# Patient Record
Sex: Female | Born: 1955
Health system: Southern US, Community
[De-identification: ages and names within clinical notes are randomized; demographics above are authoritative.]

## PROBLEM LIST (undated history)

## (undated) ENCOUNTER — Ambulatory Visit (HOSPITAL_COMMUNITY): Admission: EM | Discharge status: Absent without leave | Payer: Self-pay

## (undated) DIAGNOSIS — C8590 Non-Hodgkin lymphoma, unspecified, unspecified site: Secondary | ICD-10-CM

---

## 2007-02-11 ENCOUNTER — Ambulatory Visit: Payer: Self-pay | Admitting: Family Medicine

## 2008-04-15 ENCOUNTER — Emergency Department (HOSPITAL_COMMUNITY): Admission: EM | Admit: 2008-04-15 | Discharge: 2008-04-15 | Payer: Self-pay | Admitting: Emergency Medicine

## 2008-04-22 ENCOUNTER — Emergency Department (HOSPITAL_COMMUNITY): Admission: EM | Admit: 2008-04-22 | Discharge: 2008-04-22 | Payer: Self-pay | Admitting: Family Medicine

## 2011-06-22 LAB — BASIC METABOLIC PANEL
CO2: 22
Calcium: 9.3
Creatinine, Ser: 0.92
Glucose, Bld: 82
Sodium: 138

## 2011-06-22 LAB — DIFFERENTIAL
Basophils Absolute: 0.1
Basophils Relative: 0
Eosinophils Absolute: 0
Eosinophils Relative: 0
Monocytes Absolute: 0.3
Neutro Abs: 12.6 — ABNORMAL HIGH

## 2011-06-22 LAB — CBC
Hemoglobin: 14.4
MCHC: 34.1
RDW: 12.8

## 2012-01-22 DIAGNOSIS — L80 Vitiligo: Secondary | ICD-10-CM | POA: Insufficient documentation

## 2015-04-06 DIAGNOSIS — C8218 Follicular lymphoma grade II, lymph nodes of multiple sites: Secondary | ICD-10-CM | POA: Insufficient documentation

## 2016-03-14 DIAGNOSIS — I10 Essential (primary) hypertension: Secondary | ICD-10-CM | POA: Insufficient documentation

## 2017-12-03 DIAGNOSIS — R5383 Other fatigue: Secondary | ICD-10-CM | POA: Insufficient documentation

## 2017-12-19 DIAGNOSIS — I82419 Acute embolism and thrombosis of unspecified femoral vein: Secondary | ICD-10-CM | POA: Insufficient documentation

## 2018-04-01 ENCOUNTER — Encounter (HOSPITAL_COMMUNITY): Payer: Self-pay | Admitting: Emergency Medicine

## 2018-04-01 ENCOUNTER — Ambulatory Visit (HOSPITAL_COMMUNITY)
Admission: EM | Admit: 2018-04-01 | Discharge: 2018-04-01 | Disposition: A | Discharge status: Home / Self Care | Payer: No Typology Code available for payment source | Attending: Family Medicine | Admitting: Family Medicine

## 2018-04-01 DIAGNOSIS — J0140 Acute pansinusitis, unspecified: Secondary | ICD-10-CM

## 2018-04-01 HISTORY — DX: Non-Hodgkin lymphoma, unspecified, unspecified site: C85.90

## 2018-04-01 MED ORDER — AMOXICILLIN-POT CLAVULANATE 875-125 MG PO TABS: 1.0000 | ORAL_TABLET | Freq: Two times a day (BID) | ORAL | 0 refills | Status: DC

## 2018-04-01 MED ORDER — IPRATROPIUM BROMIDE 0.06 % NA SOLN: 2.0000 | Freq: Four times a day (QID) | NASAL | 0 refills | Status: DC

## 2018-04-01 NOTE — Discharge Instructions (Signed)
Start augmentin as directed. Continue nasacort. Start atrovent nasal spray, allergy medicine for nasal congestion/drainage. You can use over the counter nasal saline rinse such as neti pot for nasal congestion. Keep hydrated, your urine should be clear to pale yellow in color. Tylenol/motrin for fever and pain. Monitor for any worsening of symptoms, chest pain, shortness of breath, wheezing, swelling of the throat, follow up for reevaluation.   For sore throat/cough try using a honey-based tea. Use 3 teaspoons of honey with juice squeezed from half lemon. Place shaved pieces of ginger into 1/2-1 cup of water and warm over stove top. Then mix the ingredients and repeat every 4 hours as needed.

## 2018-04-01 NOTE — ED Triage Notes (Signed)
Pt here for URI sx with congestion

## 2018-04-01 NOTE — ED Provider Notes (Signed)
[place]    CSN: [id number] Arrival date & time: 04/01/18  1146   History   Chief Complaint Chief Complaint  Patient presents with  . URI    appt 1203    HPI Breanna Kirby is a 62 y.o. female.   62 year old female with history of lymphoma comes in for  10 to 11-day history of URI symptoms.  States started out with normal viral illness, rhinorrhea, nasal congestion, cough, congestion. Denies fever, chills, night sweats.  Symptoms are slowly improved, but worsening nasal congestion, sinus pressure.  She has been using Nasacort, nasal saline rinse with good relief until recently. Never smoker.     Past Medical History:  Diagnosis Date  . [name]     There are no active problems to display for this patient.   History reviewed. No pertinent surgical history.  OB History   None      Home Medications    Prior to Admission medications   Medication Sig Start Date End Date Taking? Authorizing Provider  amoxicillin-clavulanate (AUGMENTIN) 875-125 MG tablet Take 1 tablet by mouth every 12 (twelve) hours. 04/01/18   Tasia Catchings, Kynnedi Zweig V, PA-C  ipratropium (ATROVENT) 0.06 % nasal spray Place 2 sprays into both nostrils 4 (four) times daily. 04/01/18   Ok Edwards, PA-C    Family History History reviewed. No pertinent family history.  Social History Social History   Tobacco Use  . Smoking status: Never Smoker  . Smokeless tobacco: Never Used  Substance Use Topics  . Alcohol use: Never    Frequency: Never  . Drug use: Never     Allergies   Patient has no known allergies.   Review of Systems Review of Systems  Reason unable to perform ROS: See HPI as above.     Physical Exam Triage Vital Signs ED Triage Vitals [04/01/18 1213]  Enc Vitals Group     BP (!) 139/95     Pulse Rate 80     Resp 18     Temp 98.3 F (36.8 C)     Temp Source Oral     SpO2 100 %     Weight      Height      Head Circumference      Peak Flow      Pain Score      Pain  Loc      Pain Edu?      Excl. in [place]?    No data found.  Updated Vital Signs BP (!) 139/95 (BP Location: Left Arm)   Pulse 80   Temp 98.3 F (36.8 C) (Oral)   Resp 18   SpO2 100%   Physical Exam  Constitutional: She is oriented to person, place, and time. She appears well-developed and well-nourished. No distress.  HENT:  Head: Normocephalic and atraumatic.  Right Ear: Tympanic membrane, external ear and ear canal normal. Tympanic membrane is not erythematous and not bulging.  Left Ear: Tympanic membrane, external ear and ear canal normal. Tympanic membrane is not erythematous and not bulging.  Nose: Rhinorrhea present. Right sinus exhibits maxillary sinus tenderness and frontal sinus tenderness. Left sinus exhibits maxillary sinus tenderness and frontal sinus tenderness.  Mouth/Throat: Uvula is midline, oropharynx is clear and moist and mucous membranes are normal.  Eyes: Pupils are equal, round, and reactive to light. Conjunctivae are normal.  Neck: Normal range of motion. Neck supple.  Cardiovascular: Normal rate, regular rhythm and normal heart sounds. Exam reveals no gallop and  no friction rub.  No murmur heard. Pulmonary/Chest: Effort normal and breath sounds normal. She has no decreased breath sounds. She has no wheezes. She has no rhonchi. She has no rales.  Lymphadenopathy:    She has no cervical adenopathy.  Neurological: She is alert and oriented to person, place, and time.  Skin: Skin is warm and dry.  Psychiatric: She has a normal mood and affect. Her behavior is normal. Judgment normal.    UC Treatments / Results  Labs (all labs ordered are listed, but only abnormal results are displayed) Labs Reviewed - No data to display  EKG None  Radiology No results found.  Procedures Procedures (including critical care time)  Medications Ordered in UC Medications - No data to display  Initial Impression / Assessment and Plan / UC Course  I have reviewed the  triage vital signs and the nursing notes.  Pertinent labs & imaging results that were available during my care of the patient were reviewed by me and considered in my medical decision making (see chart for details).    Augmentin for sinusitis. Other symptomatic treatment discussed. Return precautions given.   Final Clinical Impressions(s) / UC Diagnoses   Final diagnoses:  Acute non-recurrent pansinusitis    ED Prescriptions    Medication Sig Dispense Auth. Provider   ipratropium (ATROVENT) 0.06 % nasal spray Place 2 sprays into both nostrils 4 (four) times daily. 15 mL Kari Kerth V, PA-C   amoxicillin-clavulanate (AUGMENTIN) 875-125 MG tablet Take 1 tablet by mouth every 12 (twelve) hours. 14 tablet Tobin Chad, [place] 04/01/18 1247

## 2018-04-02 ENCOUNTER — Telehealth (HOSPITAL_COMMUNITY): Payer: Self-pay | Admitting: Emergency Medicine

## 2018-04-02 ENCOUNTER — Telehealth (HOSPITAL_COMMUNITY): Payer: Self-pay

## 2018-04-02 NOTE — Telephone Encounter (Signed)
Patient requested antibiotic change since she has had diarrhea in the past with augmentin.  Spoke to amy yu, pa. Antibiotic changed to:   Doxycycline 100mg  BID x 10 days, #20, NO refills.  Cathlean Sauer, PA.  Called in script to [facility]  [phone] at patient's request.

## 2018-04-24 ENCOUNTER — Other Ambulatory Visit: Payer: Self-pay

## 2018-04-24 ENCOUNTER — Encounter (HOSPITAL_COMMUNITY): Payer: Self-pay | Admitting: *Deleted

## 2018-04-24 ENCOUNTER — Ambulatory Visit (HOSPITAL_COMMUNITY)
Admission: EM | Admit: 2018-04-24 | Discharge: 2018-04-24 | Disposition: A | Discharge status: Home / Self Care | Payer: No Typology Code available for payment source | Attending: Family Medicine | Admitting: Family Medicine

## 2018-04-24 DIAGNOSIS — R42 Dizziness and giddiness: Secondary | ICD-10-CM

## 2018-04-24 DIAGNOSIS — R0602 Shortness of breath: Secondary | ICD-10-CM | POA: Insufficient documentation

## 2018-04-24 DIAGNOSIS — Z79899 Other long term (current) drug therapy: Secondary | ICD-10-CM | POA: Insufficient documentation

## 2018-04-24 DIAGNOSIS — Z86718 Personal history of other venous thrombosis and embolism: Secondary | ICD-10-CM | POA: Insufficient documentation

## 2018-04-24 DIAGNOSIS — Z8572 Personal history of non-Hodgkin lymphomas: Secondary | ICD-10-CM | POA: Insufficient documentation

## 2018-04-24 DIAGNOSIS — R002 Palpitations: Secondary | ICD-10-CM | POA: Diagnosis not present

## 2018-04-24 LAB — POCT I-STAT, CHEM 8
BUN: 16 mg/dL (ref 6–20)
CALCIUM ION: 1.21 mmol/L (ref 1.15–1.40)
CHLORIDE: 99 mmol/L (ref 98–111)
Creatinine, Ser: 0.9 mg/dL (ref 0.44–1.00)
Glucose, Bld: 94 mg/dL (ref 70–99)
HEMATOCRIT: 40 % (ref 36.0–46.0)
HEMOGLOBIN: 13.6 g/dL (ref 12.0–15.0)
POTASSIUM: 4 mmol/L (ref 3.5–5.1)
SODIUM: 136 mmol/L (ref 135–145)
TCO2: 26 mmol/L (ref 22–32)

## 2018-04-24 LAB — TSH: TSH: 1.2 u[IU]/mL (ref 0.350–4.500)

## 2018-04-24 NOTE — Discharge Instructions (Signed)
Your ekg is normal today.  You are not anemic and your basic electrolytes are normal.  We will notify you if your TSH returns abnormal, you may also see your results on your MyChart.  You may need to have a heart monitor to observe your palpitations, I would recommend following up with cardiology for further evaluation.  Continue to follow up with endocrinology as scheduled.  Limit caffeine as able.  If you develop persistent palpitations, chest pain, dizziness, nausea, sweating, arm or jaw pain or otherwise worsening please return or go to Er.

## 2018-04-24 NOTE — ED Triage Notes (Signed)
C/o history of palpitations of which she has had a cardiac workup, usually happens at night. States she thinks she is having panic attacks. This am started having heart racing after walking 45 mins.

## 2018-04-24 NOTE — ED Provider Notes (Signed)
Breanna Kirby    CSN: [id number] Arrival date & time: 04/24/18  1010     History   Chief Complaint Chief Complaint  Patient presents with  . Palpitations    HPI Breanna Kirby is a 62 y.o. female.   Breanna Kirby presents with complaints of heart palpitations. Started approximately 9am this am. States she went on a 45 minute walk which is normal for her. Felt a Breanna Kirby more short of breath than normal for her, did some stretching once home and then decided to soak her feet to try to calm her breathing. As she was sitting soaking her feet she felt the onset of palpitations. No chest pain . Slight dizziness. No nausea or diaphoresis. No jaw or arm pain. States she drinks only a small cup of coffee daily, she had eaten a full breakfast. She is trying to gain weight has as she had approximately 15lb weight loss over a 3 month period. Denies any increased stress but states she feels that in the past she has had some panic attacks. Has been on antidepressants in the in past around family member's deaths, is not on currently. States she has had palpitation episodes for years and has had complete cardiac evaluation for this in the past, probably over 5 years ago. She is followed with oncology for lymphoma, radiation to neck tumor in April of this year. Incidentally found dvt and was on eliquis, this has been completed, with negative ultrasound for dvt 03/26/2018. Has appointment with endocrinology 8/13 for thyroid evaluation as oncologist concerned about weight loss. No known TSH drawn. She takes low dose amlodipine. Doesn't smoke. No night sweats. No urinary frequency or increased thirst. No known bleeding source. No recent travel. No fevers.     ROS per HPI.      Past Medical History:  Diagnosis Date  . Lymphoma ([place])     There are no active problems to display for this patient.   Past Surgical History:  Procedure Laterality Date  . CESAREAN SECTION      OB History   None       Home Medications    Prior to Admission medications   Medication Sig Start Date End Date Taking? Authorizing Provider  amoxicillin-clavulanate (AUGMENTIN) 875-125 MG tablet Take 1 tablet by mouth every 12 (twelve) hours. 04/01/18   Tasia Catchings, Amy V, PA-C  ipratropium (ATROVENT) 0.06 % nasal spray Place 2 sprays into both nostrils 4 (four) times daily. 04/01/18   Ok Edwards, PA-C    Family History History reviewed. No pertinent family history.  Social History Social History   Tobacco Use  . Smoking status: Never Smoker  . Smokeless tobacco: Never Used  Substance Use Topics  . Alcohol use: Yes    Frequency: Never  . Drug use: Never     Allergies   Patient has no known allergies.   Review of Systems Review of Systems   Physical Exam Triage Vital Signs ED Triage Vitals  Enc Vitals Group     BP 04/24/18 1023 (!) 148/82     Pulse Rate 04/24/18 1023 (!) 103     Resp 04/24/18 1023 18     Temp 04/24/18 1023 98.3 F (36.8 C)     Temp Source 04/24/18 1023 Oral     SpO2 04/24/18 1023 100 %     Weight --      Height --      Head Circumference --      Peak Flow --  Pain Score 04/24/18 1025 0     Pain Loc --      Pain Edu? --      Excl. in [place]? --    No data found.  Updated Vital Signs BP (!) 148/82 (BP Location: Left Arm)   Pulse (!) 103   Temp 98.3 F (36.8 C) (Oral)   Resp 18   SpO2 100%    Physical Exam  Constitutional: She is oriented to person, place, and time. She appears well-developed and well-nourished. No distress.  HENT:  Head: Normocephalic and atraumatic.  Cardiovascular: Normal rate, regular rhythm and normal heart sounds.  Pulmonary/Chest: Effort normal and breath sounds normal.  Neurological: She is alert and oriented to person, place, and time. No cranial nerve deficit or sensory deficit.  Skin: Skin is warm and dry.  Psychiatric: She has a normal mood and affect. Her behavior is normal. Thought content normal.  Tearful on initial exam, patient  endorses feeling scared during episodes.    EKG NSR rate 81 without acute changes.   UC Treatments / Results  Labs (all labs ordered are listed, but only abnormal results are displayed) Labs Reviewed  TSH  POCT I-STAT, CHEM 8    EKG None  Radiology No results found.  Procedures Procedures (including critical care time)  Medications Ordered in UC Medications - No data to display  Initial Impression / Assessment and Plan / UC Course  I have reviewed the triage vital signs and the nursing notes.  Pertinent labs & imaging results that were available during my care of the patient were reviewed by me and considered in my medical decision making (see chart for details).     Hr 103 on arrival. By time EKG completed HR 81. During history and physical HR remained 81-83. Regular. No chest pain . No shortness of breath. Cancer hx, not on chemo. No longer on eliquis. No travel, no leg pain. Chem 8 WNl today. TSH pending, has appointment with endocrinology in less than two weeks. Encouraged close follow up with PCP and/or cardiology. Return precautions provided. Patient verbalized understanding and agreeable to plan.    Case reviewed with supervising physician Dr. Meda Coffee.  Final Clinical Impressions(s) / UC Diagnoses   Final diagnoses:  Palpitations     Discharge Instructions     Your ekg is normal today.  You are not anemic and your basic electrolytes are normal.  We will notify you if your TSH returns abnormal, you may also see your results on your MyChart.  You may need to have a heart monitor to observe your palpitations, I would recommend following up with cardiology for further evaluation.  Continue to follow up with endocrinology as scheduled.  Limit caffeine as able.  If you develop persistent palpitations, chest pain, dizziness, nausea, sweating, arm or jaw pain or otherwise worsening please return or go to Er.     ED Prescriptions    None     Controlled Substance  Prescriptions [place] Controlled Substance Registry consulted? Not Applicable   Zigmund Gottron, NP 04/24/18 1139

## 2018-05-12 ENCOUNTER — Ambulatory Visit: Payer: No Typology Code available for payment source | Admitting: Family Medicine

## 2018-05-13 ENCOUNTER — Ambulatory Visit (INDEPENDENT_AMBULATORY_CARE_PROVIDER_SITE_OTHER): Payer: No Typology Code available for payment source | Admitting: Family Medicine

## 2018-05-13 ENCOUNTER — Encounter: Payer: Self-pay | Admitting: Family Medicine

## 2018-05-13 ENCOUNTER — Other Ambulatory Visit: Payer: Self-pay

## 2018-05-13 VITALS — BP 106/70 | HR 95 | Temp 97.9°F | Resp 16 | Ht 63.0 in | Wt 99.6 lb

## 2018-05-13 DIAGNOSIS — R634 Abnormal weight loss: Secondary | ICD-10-CM | POA: Diagnosis not present

## 2018-05-13 DIAGNOSIS — R002 Palpitations: Secondary | ICD-10-CM

## 2018-05-13 DIAGNOSIS — I9589 Other hypotension: Secondary | ICD-10-CM

## 2018-05-13 DIAGNOSIS — R1310 Dysphagia, unspecified: Secondary | ICD-10-CM

## 2018-05-13 DIAGNOSIS — R1319 Other dysphagia: Secondary | ICD-10-CM

## 2018-05-13 NOTE — Progress Notes (Signed)
Chief Complaint  Patient presents with  . New Patient (Initial Visit)    establish care.  Pt has nonhodkins lymphoma follicular cancer. Dx in 2016, doing fine, no tx until this April 2019, until a tumor found in Jan.2019 tumor got bigger and pressing vein in neck.  Blood clot found in leg, radiation in April 2019.  Found the clot in 11/2017, previously on hrt but stopped to radiation.  Pt put on eliquis, 7/3 visit with oncologist and tumor and swelling gone down, scan on blood clot and it was gone and taken off Eliquis.  Unintentional weight loss and unsure why.  Approx staye  . New Patient (Initial Visit) cont.    in 110-113 weight.  REferred to endocrinologist at [facility] and seen last week and pt has records of visit with her and they want her to get her thyroid checked.  . Palpitations    noticed at 9 am this morning, doesn't attribute to nervousness, did a 35 min walk and hand weights, comes on suddenly and feels like a blood sugar thing, and then she gets real shaky.  Ate really early this morning, 2 protein bars and gatorade.    HPI Records from Care Everywhere  Dr. Caprice Beaver MD Oncology  PATIENT IDENTIFICATION: Breanna Kirby DOB 02-19-1956 is a 61 y.o. female with a grade I-II follicular lymphoma on a plan of dynamic observation here for return examination after receiving radiation therapy to enlarged, symptomatic lymph nodes in her neck.   INTERVAL HISTORY: Breanna Kirby returns for re-evaluation after getting radiation therapy. She had progression of left supraclavicular adenopathy and 12/16/17 CT imaging of the neck and body revealed a neck left neck nodal mass and small adenopathy of decreased size in the mesentery. She completed radiation to this area, 600 cGy to the L [place] region over 3 fractions from 01/01/18 through 01/03/18. Her swallowing is back to normal. She has also now completed apixaban for 3 months of therapy for incidentally noted DVT of IJ and left common femoral vein. She has lots of  bruising on the Eliquis but no bleeding. She has lost about 11 pounds since April. She had some diarrhea for about 10 days, but her stools have been normal the last few days. She denies pain, fatigue, night sweats, fevers, facial or leg swelling or other problems.   She was referred to Endocrinology for weight loss with Dr. Oren Section  She had a full work up which were negative for celiacs, normal tfts and good cortisol stimulation test  She states that she was advised to follow up with GI and nutrition for evaluation She reports that she is having trouble swallowing which lead to her eating more slowly.   Past Medical History:  Diagnosis Date  . Lymphoma ([place])     No current outpatient medications on file.   No current facility-administered medications for this visit.     Allergies: No Known Allergies  Past Surgical History:  Procedure Laterality Date  . CESAREAN SECTION      Social History   Socioeconomic History  . Marital status: Married    Spouse name: Not on file  . Number of children: Not on file  . Years of education: Not on file  . Highest education level: Not on file  Occupational History  . Not on file  Social Needs  . Financial resource strain: Not on file  . Food insecurity:    Worry: Not on file    Inability: Not on file  . Transportation needs:  Medical: Not on file    Non-medical: Not on file  Tobacco Use  . Smoking status: Never Smoker  . Smokeless tobacco: Never Used  Substance and Sexual Activity  . Alcohol use: Yes    Frequency: Never  . Drug use: Never  . Sexual activity: Not on file  Lifestyle  . Physical activity:    Days per week: Not on file    Minutes per session: Not on file  . Stress: Not on file  Relationships  . Social connections:    Talks on phone: Not on file    Gets together: Not on file    Attends religious service: Not on file    Active member of club or organization: Not on file    Attends meetings of clubs or  organizations: Not on file    Relationship status: Not on file  Other Topics Concern  . Not on file  Social History Narrative  . Not on file    No family history on file.   ROS Review of Systems See HPI Constitution: No fevers or chills No malaise No diaphoresis Skin: No rash or itching Eyes: no blurry vision, no double vision GU: no dysuria or hematuria Neuro: no dizziness or headaches * all others reviewed and negative   Objective: Vitals:   05/13/18 1023  BP: 106/70  Pulse: 95  Resp: 16  Temp: 97.9 F (36.6 C)  TempSrc: Oral  SpO2: 98%  Weight: 99 lb 9.6 oz (45.2 kg)  Height: 5\' 3"  (1.6 m)   Weight on 05/06/2018 at Dr. Dimple Nanas office 99 lbs 10.4 oz  Physical Exam  Constitutional: She is oriented to person, place, and time. She appears well-developed and well-nourished.  HENT:  Head: Normocephalic and atraumatic.  Eyes: Conjunctivae and EOM are normal.  Neck: Normal range of motion. Neck supple.  Cardiovascular: Normal rate, regular rhythm and normal heart sounds.  No murmur heard. Pulmonary/Chest: Effort normal and breath sounds normal. No stridor. No respiratory distress. She has no wheezes.  Neurological: She is alert and oriented to person, place, and time.  Skin: Skin is warm. Capillary refill takes less than 2 seconds.  Psychiatric: She has a normal mood and affect. Her behavior is normal. Judgment and thought content normal.    Assessment and Plan [name] was seen today for new patient (initial visit), new patient (initial visit) cont. and palpitations.  Diagnoses and all orders for this visit:  Esophageal dysphagia - discussed referral  Also would await her testing to determine if GI is necessary -     SLP clinical swallow evaluation; Future  Loss of weight- pt had detailed work up with Endocrinology on 05/06/18 see careeverywhere  Palpitations- pt to track her pulse   Iatrogenic hypotension- discontinue amlodipine Reviewed a years worth of  blood pressures and her range is 100-138/60-88 Would stop amlodipine since pt has weight loss and fatigue      [name] A Nolon Rod

## 2018-05-13 NOTE — Patient Instructions (Addendum)
Discontinue amlodipine  If your blood pressure goes above 140/90 and is sustained then you can restart the amlodipine  Follow up with Speech Language Pathology for swallow evaluation  Palpitations - please check your pulse and keep a log of the range  Check at the wrist at the base of the thumb using your index and middle finger Count for 15 seconds and multiply that number by 4     If you have lab work done today you will be contacted with your lab results within the next 2 weeks.  If you have not heard from [place] then please contact us. The fastest way to get your results is to register for My Chart.   IF you received an x-ray today, you will receive an invoice from [facility]. Please contact [facility] at [phone] with questions or concerns regarding your invoice.   IF you received labwork today, you will receive an invoice from [facility]. Please contact [facility] at [phone] with questions or concerns regarding your invoice.   Our billing staff will not be able to assist you with questions regarding bills from these companies.  You will be contacted with the lab results as soon as they are available. The fastest way to get your results is to activate your My Chart account. Instructions are located on the last page of this paperwork. If you have not heard from [place] regarding the results in 2 weeks, please contact this office.     How to Take a Pulse Your pulse is the increase in pressure inside the blood vessels that carry blood from your heart to the rest of your body (arteries). Every time your heart beats, you can feel your pulse in an artery near the surface of your skin. You can easily feel your pulse in the artery in your wrist (radial artery) and in the artery in your neck (carotid artery). Taking your pulse can tell you how fast your heart is beating and whether it has a normal rhythm. You can also tell whether your heart is beating strongly or  weakly. What you need to know about pulse rates Your pulse is the same as your heart rate. Both are measured in beats per minute (bpm). A normal resting heart rate varies depending on a person's age.  Infants under 1 year of age: Normal heart rate of 100-160 bpm.  Children 67-37 years of age: Normal heart rate of 90-150 bpm.  Children 15-23 years of age: Normal heart rate of 80-140 bpm.  Children 70-61 years of age: Normal heart rate of 70-120 bpm.  Everyone over 63 years of age: Normal heart rate of 60-100 bpm.  There can be a lot of variation in your pulse. It can be different depending on the time of day or the amount of exercise that you get. It changes with your fitness level. Many things can change the speed and regularity of your pulse. These include:  Exercise.  Fever.  Stress.  Heart problems.  Poor circulation.  Medicines.  How to take your pulse To take your pulse, all you need is a digital stopwatch or a clock or watch that has a second hand. The best time to measure your resting pulse is in the morning before you start moving around. Take it as soon as you wake up or after resting for about 10 minutes. There are no firm rules about how often to check your pulse. In general, it is a good idea to check your pulse at least  once a month. Measuring your pulse is a good way to check your heart health. Checking your pulse before and after exercise can tell you if you are getting the right amount of exercise. This is called finding your target heart rate. Your target heart rate depends on your age, fitness, and health. Ask your health care provider what would be a safe target heart rate for you during exercise. Radial Pulse To check the pulse in your radial artery: 1. Turn one hand palm-up and relax your arm. 2. Place the first two fingers of your other hand gently over your wrist, just below the base of your thumb. 3. Place your fingertips just inside the bone that runs along the  outside of your arm. 4. Slowly increase pressure until you feel a pulsing beneath your fingers. You may need to move your fingers slightly. 5. Do not press too hard. Too much pressure may cut off blood supply. 6. Count how many pulse beats you feel in 1 minute. Or, count how many pulse beats you feel in 30 seconds and double that number. 7. Pay attention to the rhythm of the pulse. It should be steady and even.  Carotid Pulse To check the pulse in your carotid artery: 1. Place two fingers just to one side of your Adam's apple so that you feel a pulsing beneath your fingers. 2. Do not press too hard. Too much pressure may cut off blood supply and can make you dizzy. 3. Count how many pulse beats you feel in 1 minute. Or, count how many pulse beats you feel in 30 seconds and double that number. 4. Pay attention to the rhythm of the pulse. It should be steady and even.  Contact a health care provider if:  Your pulse is too slow or too fast.  Your pulse is weak or hard to find.  You have skipped beats or extra beats.  Your pulse has an irregular rhythm.  You have an abnormal pulse along with dizziness, fatigue, or shortness of breath. This information is not intended to replace advice given to you by your health care provider. Make sure you discuss any questions you have with your health care provider. Document Released: 03/17/2003 Document Revised: 03/30/2016 Document Reviewed: 02/14/2016 Elsevier Interactive Patient Education  [employer].

## 2018-05-14 ENCOUNTER — Encounter: Payer: Self-pay | Admitting: Family Medicine

## 2018-05-14 DIAGNOSIS — Z1211 Encounter for screening for malignant neoplasm of colon: Secondary | ICD-10-CM

## 2018-05-18 ENCOUNTER — Encounter: Payer: Self-pay | Admitting: Family Medicine

## 2018-05-22 ENCOUNTER — Telehealth: Payer: Self-pay

## 2018-05-22 NOTE — Telephone Encounter (Signed)
Message for changing referral to Dr. Earlean Shawl sent to Referral Pool.

## 2018-06-09 ENCOUNTER — Encounter: Payer: Self-pay | Admitting: Family Medicine

## 2018-06-10 ENCOUNTER — Encounter (HOSPITAL_COMMUNITY): Payer: Self-pay | Admitting: Emergency Medicine

## 2018-06-10 ENCOUNTER — Ambulatory Visit (HOSPITAL_COMMUNITY)
Admission: EM | Admit: 2018-06-10 | Discharge: 2018-06-10 | Disposition: A | Discharge status: Home / Self Care | Payer: No Typology Code available for payment source | Attending: Family Medicine | Admitting: Family Medicine

## 2018-06-10 ENCOUNTER — Other Ambulatory Visit: Payer: Self-pay

## 2018-06-10 DIAGNOSIS — M79601 Pain in right arm: Secondary | ICD-10-CM

## 2018-06-10 DIAGNOSIS — R002 Palpitations: Secondary | ICD-10-CM

## 2018-06-10 NOTE — ED Provider Notes (Signed)
[place]    CSN: [id number] Arrival date & time: 06/10/18  1440     History   Chief Complaint Chief Complaint  Patient presents with  . Arm Pain    HPI Breanna Kirby is a 62 y.o. female.   62 year old female with history of lymphoma comes in for acute onset of right arm pain with palpitations.  States was in the process of shopping when she started feeling dull aching pain to the right shoulder, about mid clavicle region, and pain radiated down to her right arm.  Shortly after, started feeling palpitations with intermittent lightheadedness.  States right arm pain lasted about 5 to 10 minutes, and palpitation lasted about 30 minutes and resolved prior to arrival.  States at the time, did have some shortness of breath very she feels like she cannot take a deep breath, but this has also resolved.  Denies dizziness, weakness, syncope.  Denies nausea, vomiting.  Denies URI symptoms such as cough, congestion, sore throat. Denies leg swelling, long hours of travel.  Patient with history of palpitations, last evaluated by cardiology 7 to 8 years ago.  She has had weight loss, which has been evaluated by endocrinology for possible thyroid disease.  States work-up was negative, and was told to follow-up with GI for further evaluation.  She has history of lymphoma status post radiation, and currently with continued monitoring.  During work-up for lymphoma, found to have a DVT, and was put on Eliquis.  States stopped Eliquis 03/2018.  At that time, was on hormones, and thought that may have caused the symptoms, and was taken off hormones.     Past Medical History:  Diagnosis Date  . Lymphoma [facility])     Patient Active Problem List   Diagnosis Date Noted  . Acute deep vein thrombosis (DVT) of femoral vein ([place]) 12/19/2017  . Fatigue 12/03/2017  . Essential hypertension 03/14/2016  . Follicular lymphoma grade II of lymph nodes of multiple sites ([place]) 04/06/2015  . Vitiligo  01/22/2012    Past Surgical History:  Procedure Laterality Date  . CESAREAN SECTION      OB History   None      Home Medications    Prior to Admission medications   Not on File    Family History History reviewed. No pertinent family history.  Social History Social History   Tobacco Use  . Smoking status: Never Smoker  . Smokeless tobacco: Never Used  Substance Use Topics  . Alcohol use: Yes    Frequency: Never  . Drug use: Never     Allergies   Patient has no known allergies.   Review of Systems Review of Systems  Reason unable to perform ROS: See HPI as above.     Physical Exam Triage Vital Signs ED Triage Vitals  Enc Vitals Group     BP 06/10/18 1503 (!) 154/80     Pulse Rate 06/10/18 1503 84     Resp 06/10/18 1503 18     Temp 06/10/18 1503 97.9 F (36.6 C)     Temp Source 06/10/18 1503 Oral     SpO2 06/10/18 1503 100 %     Weight --      Height --      Head Circumference --      Peak Flow --      Pain Score 06/10/18 1501 0     Pain Loc --      Pain Edu? --  Excl. in GC? --    No data found.  Updated Vital Signs BP (!) 154/80 (BP Location: Right Arm)   Pulse 84   Temp 97.9 F (36.6 C) (Oral)   Resp 18   SpO2 100%   Physical Exam  Constitutional: She is oriented to person, place, and time. She appears well-developed and well-nourished. No distress.  HENT:  Head: Normocephalic and atraumatic.  Eyes: Pupils are equal, round, and reactive to light. Conjunctivae are normal.  Neck: Normal range of motion. Neck supple.  Cardiovascular: Normal rate, regular rhythm and normal heart sounds. Exam reveals no gallop and no friction rub.  No murmur heard. Pulmonary/Chest: Effort normal and breath sounds normal. No stridor. No respiratory distress. She has no wheezes. She has no rales. She exhibits no tenderness.  Neurological: She is alert and oriented to person, place, and time. She has normal strength. She is not disoriented. No cranial  nerve deficit or sensory deficit. She displays a negative Romberg sign. Coordination and gait normal. GCS eye subscore is 4. GCS verbal subscore is 5. GCS motor subscore is 6.  Normal finger to nose, rapid movement.  Skin: She is not diaphoretic.     UC Treatments / Results  Labs (all labs ordered are listed, but only abnormal results are displayed) Labs Reviewed - No data to display  EKG None  Radiology No results found.  Procedures Procedures (including critical care time)  Medications Ordered in UC Medications - No data to display  Initial Impression / Assessment and Plan / UC Course  I have reviewed the triage vital signs and the nursing notes.  Pertinent labs & imaging results that were available during my care of the patient were reviewed by me and considered in my medical decision making (see chart for details).    Patient with resolution of symptoms.  Exam reassuring.  EKG normal sinus rhythm with sinus arrhythmia, 84 bpm, rightward axis, unchanged from prior EKG.  EKG also reviewed by Dr. Joseph Art.  Given resolution of symptoms, will have patient monitor for now.  Strict return precautions given.  Otherwise patient to follow-up with PCP/cardiologist for further evaluation and management needed.  Patient expresses understanding and agrees to plan.  Case discussed with Dr Joseph Art, who agrees to plan.  Final Clinical Impressions(s) / UC Diagnoses   Final diagnoses:  Right arm pain  Palpitations    ED Prescriptions    None       Ok Edwards, PA-C 06/10/18 1739

## 2018-06-10 NOTE — ED Triage Notes (Signed)
Right arm pain radiating from neck into arm.  Pain dull.  Pain lasted about 5-10 minutes.  Heart started racing after arm started hurting.  Denies chest pain

## 2018-06-10 NOTE — Discharge Instructions (Signed)
No alarming signs on exam. Your EKG unchanged from prior. Monitor for now. Follow up with PCP/cardiology for further evaluation needed. If experiencing returning of symptoms that is unresolving, worsening symptoms, chest pain, shortness of breath, palpations, weakness, passing out, confusion/altered mental status, go to the emergency department for further evaluation needed.

## 2018-07-15 NOTE — Progress Notes (Signed)
Chief Complaint  Patient presents with  . Establish Care    in good health     HPI  She reports that she is holding steady with her weight now  Wt Readings from Last 3 Encounters:  07/16/18 102 lb 12.8 oz (46.6 kg)  05/13/18 99 lb 9.6 oz (45.2 kg)   She reports that she eats very healthy and is going to get an EGD and colonoscopy with Dr. Earlean Shawl She states that she feels fine otherwise  Her records show that Dr. Caprice Beaver  Recommended that she remain off the apixaban for acute DVT in mid left IJ. She reports that she took it for 3 months.  Her d-dimer was consistently less than 215 both July and June 27, 2018 She denies calf pain, shortness of breath or swelling in her extremities    Past Medical History:  Diagnosis Date  . Lymphoma ([place])     No current outpatient medications on file.   No current facility-administered medications for this visit.     Allergies: No Known Allergies  Past Surgical History:  Procedure Laterality Date  . CESAREAN SECTION      Social History   Socioeconomic History  . Marital status: Married    Spouse name: Not on file  . Number of children: Not on file  . Years of education: Not on file  . Highest education level: Not on file  Occupational History  . Not on file  Social Needs  . Financial resource strain: Not on file  . Food insecurity:    Worry: Not on file    Inability: Not on file  . Transportation needs:    Medical: Not on file    Non-medical: Not on file  Tobacco Use  . Smoking status: Never Smoker  . Smokeless tobacco: Never Used  Substance and Sexual Activity  . Alcohol use: Yes    Frequency: Never  . Drug use: Never  . Sexual activity: Yes  Lifestyle  . Physical activity:    Days per week: Not on file    Minutes per session: Not on file  . Stress: Not on file  Relationships  . Social connections:    Talks on phone: Not on file    Gets together: Not on file    Attends religious service: Not on file   Active member of club or organization: Not on file    Attends meetings of clubs or organizations: Not on file    Relationship status: Not on file  Other Topics Concern  . Not on file  Social History Narrative  . Not on file    No family history on file.   ROS Review of Systems See HPI Constitution: No fevers or chills No malaise No diaphoresis Skin: No rash or itching Eyes: no blurry vision, no double vision GU: no dysuria or hematuria Neuro: no dizziness or headaches * all others reviewed and negative   Objective: Vitals:   07/16/18 1146  BP: 137/83  Pulse: 98  Resp: 14  Temp: 98 F (36.7 C)  TempSrc: Oral  SpO2: 96%  Weight: 102 lb 12.8 oz (46.6 kg)  Height: 5\' 3"  (1.6 m)    Physical Exam  Constitutional: She is oriented to person, place, and time. She appears well-developed and well-nourished.  HENT:  Head: Normocephalic and atraumatic.  Eyes: Conjunctivae and EOM are normal.  Neck: Normal range of motion. Neck supple.  Cardiovascular: Normal rate, regular rhythm and normal heart sounds.  No murmur heard. Pulmonary/Chest:  Effort normal and breath sounds normal. No stridor. No respiratory distress. She has no wheezes.  Abdominal: Soft. Bowel sounds are normal. She exhibits no distension and no mass. There is no tenderness. There is no guarding.  Lymphadenopathy:    She has no cervical adenopathy.  Neurological: She is alert and oriented to person, place, and time.  Skin: Skin is warm. Capillary refill takes less than 2 seconds.  Psychiatric: She has a normal mood and affect. Her behavior is normal. Judgment and thought content normal.    Assessment and Plan [place] was seen today for establish care.  Diagnoses and all orders for this visit:  Follicular lymphoma grade II of lymph nodes of multiple sites [facility])- reviewed Oncology recommendations  Need for prophylactic vaccination and inoculation against influenza- flu vaccine today  -     Flu Vaccine QUAD  36+ mos IM  Loss of weight- stabilizing loss of weight  Weight gain today   Follow up for physical with pap smear at next visit   [place]

## 2018-07-16 ENCOUNTER — Ambulatory Visit: Payer: No Typology Code available for payment source | Admitting: Family Medicine

## 2018-07-16 ENCOUNTER — Other Ambulatory Visit: Payer: Self-pay

## 2018-07-16 ENCOUNTER — Encounter: Payer: Self-pay | Admitting: Family Medicine

## 2018-07-16 VITALS — BP 137/83 | HR 98 | Temp 98.0°F | Resp 14 | Ht 63.0 in | Wt 102.8 lb

## 2018-07-16 DIAGNOSIS — C8218 Follicular lymphoma grade II, lymph nodes of multiple sites: Secondary | ICD-10-CM | POA: Diagnosis not present

## 2018-07-16 DIAGNOSIS — R634 Abnormal weight loss: Secondary | ICD-10-CM | POA: Diagnosis not present

## 2018-07-16 DIAGNOSIS — Z23 Encounter for immunization: Secondary | ICD-10-CM

## 2018-07-16 NOTE — Patient Instructions (Signed)
° ° ° °  If you have lab work done today you will be contacted with your lab results within the next 2 weeks.  If you have not heard from us then please contact us. The fastest way to get your results is to register for My Chart. ° ° °IF you received an x-ray today, you will receive an invoice from [facility]. Please contact [facility] at [phone] with questions or concerns regarding your invoice.  ° °IF you received labwork today, you will receive an invoice from [facility]. Please contact [facility] at [phone] with questions or concerns regarding your invoice.  ° °Our billing staff will not be able to assist you with questions regarding bills from these companies. ° °You will be contacted with the lab results as soon as they are available. The fastest way to get your results is to activate your My Chart account. Instructions are located on the last page of this paperwork. If you have not heard from us regarding the results in 2 weeks, please contact this office. °  ° ° ° °

## 2018-07-23 ENCOUNTER — Encounter: Payer: Self-pay | Admitting: Family Medicine

## 2018-07-24 NOTE — Telephone Encounter (Signed)
Medical record dept is not able to do this, maybe clinical.

## 2018-08-06 ENCOUNTER — Ambulatory Visit: Payer: No Typology Code available for payment source | Admitting: Physician Assistant

## 2018-08-06 ENCOUNTER — Encounter: Payer: Self-pay | Admitting: Physician Assistant

## 2018-08-06 ENCOUNTER — Other Ambulatory Visit: Payer: Self-pay

## 2018-08-06 VITALS — BP 130/75 | HR 78 | Temp 98.0°F | Resp 18 | Ht 62.21 in | Wt 103.0 lb

## 2018-08-06 DIAGNOSIS — M62838 Other muscle spasm: Secondary | ICD-10-CM

## 2018-08-06 MED ORDER — CYCLOBENZAPRINE HCL 5 MG PO TABS: 5.0000 mg | ORAL_TABLET | Freq: Three times a day (TID) | ORAL | 0 refills | Status: DC | PRN

## 2018-08-06 NOTE — Patient Instructions (Addendum)
I recommend topical heat, neck exercises, and muscle relaxants as needed. Muscle relaxants can make you drowsy.  You may also benefit from massage therapy.  I have placed a referral for physical therapy and they should continue for the next 1 to 2 weeks.  Please continue to drink lots of water and stretch daily.   If you have lab work done today you will be contacted with your lab results within the next 2 weeks.  If you have not heard from [place] then please contact us. The fastest way to get your results is to register for My Chart.  Neck Exercises Neck exercises can be important for many reasons:  They can help you to improve and maintain flexibility in your neck. This can be especially important as you age.  They can help to make your neck stronger. This can make movement easier.  They can reduce or prevent neck pain.  They may help your upper back.  Ask your health care provider which neck exercises would be best for you. Exercises Neck Press Repeat this exercise 10 times. Do it first thing in the morning and right before bed or as told by your health care provider. 1. Lie on your back on a firm bed or on the floor with a pillow under your head. 2. Use your neck muscles to push your head down on the pillow and straighten your spine. 3. Hold the position as well as you can. Keep your head facing up and your chin tucked. 4. Slowly count to 5 while holding this position. 5. Relax for a few seconds. Then repeat.  Isometric Strengthening Do a full set of these exercises 2 times a day or as told by your health care provider. 1. Sit in a supportive chair and place your hand on your forehead. 2. Push forward with your head and neck while pushing back with your hand. Hold for 10 seconds. 3. Relax. Then repeat the exercise 3 times. 4. Next, do thesequence again, this time putting your hand against the back of your head. Use your head and neck to push backward against the hand  pressure. 5. Finally, do the same exercise on either side of your head, pushing sideways against the pressure of your hand.  Prone Head Lifts Repeat this exercise 5 times. Do this 2 times a day or as told by your health care provider. 1. Lie face-down, resting on your elbows so that your chest and upper back are raised. 2. Start with your head facing downward, near your chest. Position your chin either on or near your chest. 3. Slowly lift your head upward. Lift until you are looking straight ahead. Then continue lifting your head as far back as you can stretch. 4. Hold your head up for 5 seconds. Then slowly lower it to your starting position.  Supine Head Lifts Repeat this exercise 8-10 times. Do this 2 times a day or as told by your health care provider. 1. Lie on your back, bending your knees to point to the ceiling and keeping your feet flat on the floor. 2. Lift your head slowly off the floor, raising your chin toward your chest. 3. Hold for 5 seconds. 4. Relax and repeat.  Scapular Retraction Repeat this exercise 5 times. Do this 2 times a day or as told by your health care provider. 1. Stand with your arms at your sides. Look straight ahead. 2. Slowly pull both shoulders backward and downward until you feel a stretch between your  shoulder blades in your upper back. 3. Hold for 10-30 seconds. 4. Relax and repeat.  Contact a health care provider if:  Your neck pain or discomfort gets much worse when you do an exercise.  Your neck pain or discomfort does not improve within 2 hours after you exercise. If you have any of these problems, stop exercising right away. Do not do the exercises again unless your health care provider says that you can. Get help right away if:  You develop sudden, severe neck pain. If this happens, stop exercising right away. Do not do the exercises again unless your health care provider says that you can. Exercises Neck Stretch  Repeat this exercise  3-5 times. 1. Do this exercise while standing or while sitting in a chair. 2. Place your feet flat on the floor, shoulder-width apart. 3. Slowly turn your head to the right. Turn it all the way to the right so you can look over your right shoulder. Do not tilt or tip your head. 4. Hold this position for 10-30 seconds. 5. Slowly turn your head to the left, to look over your left shoulder. 6. Hold this position for 10-30 seconds.  Neck Retraction Repeat this exercise 8-10 times. Do this 3-4 times a day or as told by your health care provider. 1. Do this exercise while standing or while sitting in a sturdy chair. 2. Look straight ahead. Do not bend your neck. 3. Use your fingers to push your chin backward. Do not bend your neck for this movement. Continue to face straight ahead. If you are doing the exercise properly, you will feel a slight sensation in your throat and a stretch at the back of your neck. 4. Hold the stretch for 1-2 seconds. Relax and repeat.  This information is not intended to replace advice given to you by your health care provider. Make sure you discuss any questions you have with your health care provider. Document Released: 08/22/2015 Document Revised: 02/16/2016 Document Reviewed: 03/21/2015 Elsevier Interactive Patient Education  2018 [employer].   IF you received an x-ray today, you will receive an invoice from [facility]. Please contact [facility] at [phone] with questions or concerns regarding your invoice.   IF you received labwork today, you will receive an invoice from [place]. Please contact [facility] at [phone] with questions or concerns regarding your invoice.   Our billing staff will not be able to assist you with questions regarding bills from these companies.  You will be contacted with the lab results as soon as they are available. The fastest way to get your results is to activate your My Chart account. Instructions are  located on the last page of this paperwork. If you have not heard from [place] regarding the results in 2 weeks, please contact this office.

## 2018-08-06 NOTE — Progress Notes (Signed)
Breanna Kirby  MRN: [id number] DOB: 09-21-1956  Subjective:  Breanna Kirby is a 62 y.o. female seen in office today for a chief complaint of upper back and neck pain x 1-2 years. Last few months have been worse.  Feels spasm like in nature.   Onset was related to / precipitated by no known injury. She does work out often and Chief Strategy Officer. Symptoms are exacerbated as the day progresses. Symptoms are improved by NSAIDs, rest, stretching and icy hot.  She denies numbness, tingling, weakness, and dropping objects.  Review of Systems  Constitutional: Negative for chills, diaphoresis and fever.    Patient Active Problem List   Diagnosis Date Noted  . Acute deep vein thrombosis (DVT) of femoral vein ([place]) 12/19/2017  . Fatigue 12/03/2017  . Essential hypertension 03/14/2016  . Follicular lymphoma grade II of lymph nodes of multiple sites ([place]) 04/06/2015  . Vitiligo 01/22/2012    No current outpatient medications on file prior to visit.   No current facility-administered medications on file prior to visit.     No Known Allergies   Objective:  BP 130/75   Pulse 78   Temp 98 F (36.7 C) (Oral)   Resp 18   Ht 5' 2.21" (1.58 m)   Wt 103 lb (46.7 kg)   SpO2 97%   BMI 18.71 kg/m   Physical Exam  Constitutional: She is oriented to person, place, and time. She appears well-developed and well-nourished. No distress.  HENT:  Head: Normocephalic and atraumatic.  Eyes: Conjunctivae are normal.  Neck: Normal range of motion and full passive range of motion without pain. Muscular tenderness (with palpation of bilateral trapezius musculature, palpable spasms noted b/l) present. No spinous process tenderness present. No neck rigidity. No edema, no erythema and normal range of motion present. No Brudzinski's sign and no Kernig's sign noted.  Pulmonary/Chest: Effort normal.  Musculoskeletal: Normal range of motion.       Cervical back: She exhibits spasm. She exhibits normal  range of motion, no bony tenderness and no edema.  Neurological: She is alert and oriented to person, place, and time.  Reflex Scores:      Tricep reflexes are 2+ on the right side and 2+ on the left side.      Bicep reflexes are 2+ on the right side and 2+ on the left side.      Brachioradialis reflexes are 2+ on the right side and 2+ on the left side. Sensation of BUE intact. Strength BUE 5/5.   Skin: Skin is warm and dry.  Psychiatric: She has a normal mood and affect.  Vitals reviewed.   Assessment and Plan :  1. Neck muscle spasm Hx and PE findings consistent with neck spasm. No acute findings on neuro exam. No midline tenderness. Recommend conservative tx with rest, heating pad, NSAIDs, muscle relaxants, and neck exercises as tolerated. Pt works out often, will refer to PT for further management if no improvement with tx above. Advised to return to clinic if symptoms worsen, do not improve, or as needed.  - Ambulatory referral to Physical Therapy - Care order/instruction:  Side effects, risks, benefits, and alternatives of the medications and treatment plan prescribed today were discussed, and patient expressed understanding of the instructions given. No barriers to understanding were identified. Red flags discussed in detail. Pt expressed understanding regarding what to do in case of emergency/urgent symptoms.     Tenna Delaine PA-C  [facility] 08/06/2018 2:10 PM

## 2018-08-07 ENCOUNTER — Telehealth: Payer: Self-pay | Admitting: Physician Assistant

## 2018-08-07 NOTE — Telephone Encounter (Signed)
Opened in error

## 2018-09-02 ENCOUNTER — Other Ambulatory Visit: Payer: Self-pay

## 2018-09-02 ENCOUNTER — Ambulatory Visit: Payer: No Typology Code available for payment source | Attending: Physician Assistant

## 2018-09-02 DIAGNOSIS — M542 Cervicalgia: Secondary | ICD-10-CM | POA: Diagnosis not present

## 2018-09-02 DIAGNOSIS — R252 Cramp and spasm: Secondary | ICD-10-CM

## 2018-09-02 DIAGNOSIS — M546 Pain in thoracic spine: Secondary | ICD-10-CM | POA: Diagnosis present

## 2018-09-02 NOTE — Patient Instructions (Signed)
Access Code: [email or web address]  URL: [email or web address]  Date: 09/02/2018  Prepared by: Sigurd Sos   Exercises  Cat-Camel - 10 reps - 1 sets - 5 hold - 2x daily - 7x weekly  Child's Pose Stretch - 3 reps - 1 sets - 20 hold - 2x daily - 7x weekly  Child's Pose with Sidebending - 3 reps - 1 sets - 20 hold - 2x daily - 7x weekly  Seated Cervical Flexion AROM - 3 reps - 1 sets - 20 hold - 3x daily - 7x weekly  Seated Cervical Sidebending AROM - 3 reps - 1 sets - 20 hold - 3x daily - 7x weekly  Seated Cervical Rotation AROM - 3 reps - 1 sets - 20 hold - 3x daily - 7x weekly  Seated Correct Posture - 10 reps - 3 sets - 1x daily - 7x weekly

## 2018-09-02 NOTE — Therapy (Signed)
[facility] [address], [place], [place], [address] Phone: [phone]   Fax:  [phone]  Physical Therapy Evaluation  Patient Details  Name: Breanna Kirby MRN: [id number] Date of Birth: 05-27-1956 Referring Provider (PT): Tenna Delaine, MD   Encounter Date: 09/02/2018  PT End of Session - 09/02/18 1316    Visit Number  1    Date for PT Re-Evaluation  10/28/18    PT Start Time  1230    PT Stop Time  1315    PT Time Calculation (min)  45 min    Activity Tolerance  Patient tolerated treatment well    Behavior During Therapy  [facility] for tasks assessed/performed       Past Medical History:  Diagnosis Date  . Lymphoma [facility])     Past Surgical History:  Procedure Laterality Date  . CESAREAN SECTION      There were no vitals filed for this visit.   Subjective Assessment - 09/02/18 1232    Subjective  Pt presents to PT with complaints of neck and thoracic pain that began ~3 months ago.  Pt has Non Hodgkins Lymphoma and has recently received radiation treatement.  Pt exercises regularly.      Pertinent History  Non Hodgkins Lymphoma- diagnosed 2016.  Nodule in Lt neck with radiation 12/2017    Diagnostic tests  none specific to the neck and thoracic spine.    Patient Stated Goals  reduce neck and thoracic pain    Currently in Pain?  Yes    Pain Score  0-No pain   it just feels tight   Pain Location  Thoracic   neck   Pain Orientation  Left;Right    Pain Descriptors / Indicators  Tightness;Aching    Pain Type  Chronic pain    Pain Onset  More than a month ago    Pain Frequency  Constant    Aggravating Factors   it is just tight     Pain Relieving Factors  yoga, stretching         [facility] PT Assessment - 09/02/18 0001      Assessment   Medical Diagnosis  neck muscle spasm    Referring Provider (PT)  Tenna Delaine, MD    Onset Date/Surgical Date  06/03/18    Next MD Visit  none    Prior Therapy   none      Precautions   Precautions  Other (comment)   history of cancer     Restrictions   Weight Bearing Restrictions  No      Balance Screen   Has the patient fallen in the past 6 months  No    Has the patient had a decrease in activity level because of a fear of falling?   No    Is the patient reluctant to leave their home because of a fear of falling?   No      Home Environment   Living Environment  Private residence    Living Arrangements  Spouse/significant other    Type of [place]      Prior Function   Level of [place]  Retired    Leisure  yoga, gym [employer], Barre Class      Cognition   Overall Cognitive Status  Within Functional Limits for tasks assessed      Observation/Other Assessments   Focus on Therapeutic Outcomes (FOTO)   41% limitation  Posture/Postural Control   Posture/Postural Control  Postural limitations    Postural Limitations  Decreased thoracic kyphosis      ROM / Strength   AROM / PROM / Strength  Strength;AROM;PROM      AROM   Overall AROM   Within functional limits for tasks performed    Overall AROM Comments  Lt cervical sidebending limited by 25% vs the Rt.  All Cervical and UE A/ROM is full with tightness reported at end range of thoracic and cervical ROM.        PROM   Overall PROM   Within functional limits for tasks performed      Palpation   Spinal mobility  reduced spinal mobility C4-6, T5-7 with mild pain reported    Palpation comment  tension and trigger points in bil upper traps and bil thoracic paraspinals      Transfers   Transfers  Independent with all Transfers      Ambulation/Gait   Ambulation/Gait  Yes    Gait Pattern  Within Functional Limits                Objective measurements completed on examination: See above findings.              PT Education - 09/02/18 1310    Education Details   Access Code: [email or web address]     Person(s) Educated  Patient    Methods   Explanation;Demonstration;Handout    Comprehension  Verbalized understanding;Returned demonstration       PT Short Term Goals - 09/02/18 1331      PT SHORT TERM GOAL #1   Title  be independent in initial HEP    Time  4    Period  Weeks    Status  New    Target Date  09/30/18      PT SHORT TERM GOAL #2   Title  report a 25% reduction in cervical and thoracic stiffness with daily activity    Time  4    Period  Weeks    Status  New    Target Date  09/30/18        PT Long Term Goals - 09/02/18 1247      PT LONG TERM GOAL #1   Title  be independent in advanced HEP    Time  8    Period  Weeks    Status  New    Target Date  10/28/18      PT LONG TERM GOAL #2   Title  reduce FOTO to < or = to 31% limitation    Time  8    Period  Weeks    Status  New    Target Date  10/28/18      PT LONG TERM GOAL #3   Title  report a 75% reduction in cervical and thoracic stiffness with daily activity    Time  8    Period  Weeks    Status  New    Target Date  10/28/18      PT LONG TERM GOAL #4   Title  demonstrate Rt=Lt cervical sidebending to improve cervical mobility    Time  8    Period  Weeks    Status  New    Target Date  10/28/18             Plan - 09/02/18 1323    Clinical Impression Statement  Pt presents to PT with complaints of cervical and thoracic stiffness  and constant soreness that began 3 months ago.  Pt exercises regularly doing yoga and barre class.  Pt also reports some Rt jaw pain and this has not been diagnosed by MD at this time.  Pt has Non Hodgkin's Lymphoma and had 3 sessions of radiation to Lt neck earlier this year.  Pt with reduced thoracic kyphosis and scapular elevation in sitting.  Stiffness reported at end range cervical and thoracic A/ROM and Lt cervical sidebending limited by 25% vs the Rt.  All other A/ROM is full.  Pt with trigger points and tension over bil upper traps and bil thoracic paraspinals and reduced segmental mobility in the  cervical and thoracic spine.  Pt will benefit from skilled PT to address thoracic and cervical immobility of spinal segments and tension/trigger points in associated musculature.      History and Personal Factors relevant to plan of care:  Non Hodgkin's Lymphoma      Clinical Presentation  Stable    Clinical Decision Making  Low    Rehab Potential  Excellent    PT Frequency  2x / week    PT Duration  8 weeks    PT Treatment/Interventions  ADLs/Self Care Home Management;Cryotherapy;Electrical Stimulation;Traction;Therapeutic activities;Therapeutic exercise;Functional mobility training;Patient/family education;Neuromuscular re-education;Manual techniques;Passive range of motion;Taping;Dry needling    PT Next Visit Plan  dry needling to bil upper traps and cervical/thoracic multifidi, thoracic/cervical segmental mobility, assess Lt TMJ    PT Home Exercise Plan  Access Code: [email or web address]     Consulted and Agree with Plan of Care  Patient       Patient will benefit from skilled therapeutic intervention in order to improve the following deficits and impairments:  Pain, Increased fascial restricitons, Increased muscle spasms, Decreased mobility, Impaired flexibility  Visit Diagnosis: Cervicalgia - Plan: PT plan of care cert/re-cert  Cramp and spasm - Plan: PT plan of care cert/re-cert  Pain in thoracic spine - Plan: PT plan of care cert/re-cert     Problem List Patient Active Problem List   Diagnosis Date Noted  . Acute deep vein thrombosis (DVT) of femoral vein ([place]) 12/19/2017  . Fatigue 12/03/2017  . Essential hypertension 03/14/2016  . Follicular lymphoma grade II of lymph nodes of multiple sites ([place]) 04/06/2015  . Vitiligo 01/22/2012    Sigurd Sos, PT 09/02/18 1:35 PM  [facility] [address] [place], [place], [address] Phone: [phone]   Fax:  [phone]  Name: KIMBLERY DIOP MRN: [id number] Date of Birth:  1956-04-17

## 2018-09-05 ENCOUNTER — Encounter: Payer: Self-pay | Admitting: Physical Therapy

## 2018-09-05 ENCOUNTER — Ambulatory Visit: Payer: No Typology Code available for payment source | Admitting: Physical Therapy

## 2018-09-05 DIAGNOSIS — R252 Cramp and spasm: Secondary | ICD-10-CM

## 2018-09-05 DIAGNOSIS — M542 Cervicalgia: Secondary | ICD-10-CM

## 2018-09-05 DIAGNOSIS — M546 Pain in thoracic spine: Secondary | ICD-10-CM

## 2018-09-05 NOTE — Therapy (Signed)
[facility] [address], [place], [place], [address] Phone: [phone]   Fax:  [phone]  Physical Therapy Treatment  Patient Details  Name: CATTLEYA DOBRATZ MRN: [id number] Date of Birth: 11/27/1955 Referring Provider (PT): Tenna Delaine, MD   Encounter Date: 09/05/2018  PT End of Session - 09/05/18 1119    Visit Number  2    Date for PT Re-Evaluation  10/28/18    PT Start Time  5643    PT Stop Time  1110   DN, heat   PT Time Calculation (min)  55 min    Activity Tolerance  Patient tolerated treatment well       Past Medical History:  Diagnosis Date  . Lymphoma [facility])     Past Surgical History:  Procedure Laterality Date  . CESAREAN SECTION      There were no vitals filed for this visit.  Subjective Assessment - 09/05/18 1017    Subjective  [facility] and got on the treadmill.  My neck did OK.  My jaw pops in the mornings.  No pain with chewing.      Pertinent History  Non Hodgkins Lymphoma- diagnosed 2016.  Nodule in Lt neck with radiation 12/2017    Currently in Pain?  Yes    Pain Score  1     Pain Location  Neck    Pain Orientation  Right;Left    Pain Descriptors / Indicators  Sore         [facility] PT Assessment - 09/05/18 0001      AROM   Jaw-Incisal Opening   full opening ROM slight deviation right    no pain   Jaw-Right Lateral Excursion  decreased right lateral deviation ROM   no pain                   [facility] Adult PT Treatment/Exercise - 09/05/18 0001      Exercises   Other Exercises   jaw ex's per HEP      Neck Exercises: Seated   Other Seated Exercise  review of initial HEP     Other Seated Exercise  discussion of barre and yoga modifications as needed       Moist Heat Therapy   Number Minutes Moist Heat  10 Minutes    Moist Heat Location  Cervical      Manual Therapy   Manual Therapy  Soft tissue mobilization    Soft  tissue mobilization  bil cervical paraspinals, suboccipitals, upper traps       Trigger Point Dry Needling - 09/05/18 1118    Consent Given?  Yes    Education Handout Provided  Yes    Muscles Treated Upper Body  Upper trapezius;Suboccipitals muscle group   bil cervical multifidi   Upper Trapezius Response  Twitch reponse elicited;Palpable increased muscle length    SubOccipitals Response  Twitch response elicited;Palpable increased muscle length           PT Education - 09/05/18 1119    Education Details   Access Code: [email or web address] dry needling after care;  TMJ ex    Person(s) Educated  Patient    Methods  Explanation;Handout    Comprehension  Returned demonstration;Verbalized understanding       PT Short Term Goals - 09/02/18 1331      PT SHORT TERM GOAL #1   Title  be independent in initial HEP  Time  4    Period  Weeks    Status  New    Target Date  09/30/18      PT SHORT TERM GOAL #2   Title  report a 25% reduction in cervical and thoracic stiffness with daily activity    Time  4    Period  Weeks    Status  New    Target Date  09/30/18        PT Long Term Goals - 09/02/18 1247      PT LONG TERM GOAL #1   Title  be independent in advanced HEP    Time  8    Period  Weeks    Status  New    Target Date  10/28/18      PT LONG TERM GOAL #2   Title  reduce FOTO to < or = to 31% limitation    Time  8    Period  Weeks    Status  New    Target Date  10/28/18      PT LONG TERM GOAL #3   Title  report a 75% reduction in cervical and thoracic stiffness with daily activity    Time  8    Period  Weeks    Status  New    Target Date  10/28/18      PT LONG TERM GOAL #4   Title  demonstrate Rt=Lt cervical sidebending to improve cervical mobility    Time  8    Period  Weeks    Status  New    Target Date  10/28/18            Plan - 09/05/18 1055    Clinical Impression Statement  The patient denies jaw pain but reports popping particularly in the  mornings.  She is receptive to initial HEP to address muscle tightness restricting lateral deviation ROM and isometric strengthening.  Decreased tender point size and number as well as improved soft tissue length in upper traps and suboccipitals following DN and manual therapy.  Therapist closely monitoring response with all treatment interventions.      Rehab Potential  Excellent    PT Frequency  2x / week    PT Duration  8 weeks    PT Treatment/Interventions  ADLs/Self Care Home Management;Cryotherapy;Electrical Stimulation;Traction;Therapeutic activities;Therapeutic exercise;Functional mobility training;Patient/family education;Neuromuscular re-education;Manual techniques;Passive range of motion;Taping;Dry needling    PT Next Visit Plan  assess response to dry needling to bil upper traps and cervical/thoracic multifidi #1, thoracic/cervical segmental mobility, assess response to TMJ ex    PT Home Exercise Plan  Access Code: [email or web address]        Patient will benefit from skilled therapeutic intervention in order to improve the following deficits and impairments:  Pain, Increased fascial restricitons, Increased muscle spasms, Decreased mobility, Impaired flexibility  Visit Diagnosis: Cervicalgia  Cramp and spasm  Pain in thoracic spine     Problem List Patient Active Problem List   Diagnosis Date Noted  . Acute deep vein thrombosis (DVT) of femoral vein ([place]) 12/19/2017  . Fatigue 12/03/2017  . Essential hypertension 03/14/2016  . Follicular lymphoma grade II of lymph nodes of multiple sites ([place]) 04/06/2015  . Vitiligo 01/22/2012  Ruben Im, PT 09/05/18 11:28 AM Phone: [phone] Fax: [phone] Alvera Singh 09/05/2018, 11:28 AM  [facility] [address], [place], [place], [address] Phone: [phone]   Fax:  [phone]  Name: LEO WEYANDT MRN: [id number]  Date of Birth: [birth year]

## 2018-09-05 NOTE — Patient Instructions (Signed)
Access Code: [email or web address]  URL: [email or web address]  Date: 09/05/2018  Prepared by: Ruben Im   Exercises  Cat-Camel - 10 reps - 1 sets - 5 hold - 2x daily - 7x weekly  Child's Pose Stretch - 3 reps - 1 sets - 20 hold - 2x daily - 7x weekly  Child's Pose with Sidebending - 3 reps - 1 sets - 20 hold - 2x daily - 7x weekly  Seated Cervical Flexion AROM - 3 reps - 1 sets - 20 hold - 3x daily - 7x weekly  Seated Cervical Sidebending AROM - 3 reps - 1 sets - 20 hold - 3x daily - 7x weekly  Seated Cervical Rotation AROM - 3 reps - 1 sets - 20 hold - 3x daily - 7x weekly  Seated Correct Posture - 10 reps - 3 sets - 1x daily - 7x weekly  Seated One Sided Jaw Distraction - 10 reps - 1 sets - 1x daily - 7x weekly  Seated Jaw Distraction - 10 reps - 1 sets - 1x daily - 7x weekly  Isometric Jaw Deviation - 10 reps - 1 sets - 1x daily - 7x weekly  Isometric Jaw Abduction - 10 reps - 1 sets - 1x daily - 7x weekly     Trigger Point Dry Needling  . What is Trigger Point Dry Needling (DN)? o DN is a physical therapy technique used to treat muscle pain and dysfunction. Specifically, DN helps deactivate muscle trigger points (muscle knots).  o A thin filiform needle is used to penetrate the skin and stimulate the underlying trigger point. The goal is for a local twitch response (LTR) to occur and for the trigger point to relax. No medication of any kind is injected during the procedure.   . What Does Trigger Point Dry Needling Feel Like?  o The procedure feels different for each individual patient. Some patients report that they do not actually feel the needle enter the skin and overall the process is not painful. Very mild bleeding may occur. However, many patients feel a deep cramping in the muscle in which the needle was inserted. This is the local twitch response.   Marland Kitchen How Will I feel after the treatment? o Soreness is normal, and the onset of soreness may not occur for a few hours.  Typically this soreness does not last longer than two days.  o Bruising is uncommon, however; ice can be used to decrease any possible bruising.  o In rare cases feeling tired or nauseous after the treatment is normal. In addition, your symptoms may get worse before they get better, this period will typically not last longer than 24 hours.   . What Can I do After My Treatment? o Increase your hydration by drinking more water for the next 24 hours. o You may place ice or heat on the areas treated that have become sore, however, do not use heat on inflamed or bruised areas. Heat often brings more relief post needling. o You can continue your regular activities, but vigorous activity is not recommended initially after the treatment for 24 hours. o DN is best combined with other physical therapy such as strengthening, stretching, and other therapies.

## 2018-09-09 ENCOUNTER — Encounter: Payer: Self-pay | Admitting: Physical Therapy

## 2018-09-09 ENCOUNTER — Ambulatory Visit: Payer: No Typology Code available for payment source | Admitting: Physical Therapy

## 2018-09-09 DIAGNOSIS — M546 Pain in thoracic spine: Secondary | ICD-10-CM

## 2018-09-09 DIAGNOSIS — M542 Cervicalgia: Secondary | ICD-10-CM | POA: Diagnosis not present

## 2018-09-09 DIAGNOSIS — R252 Cramp and spasm: Secondary | ICD-10-CM

## 2018-09-09 NOTE — Patient Instructions (Signed)
Over Head Pull: Narrow Grip       On back, knees bent, feet flat, band across thighs, elbows straight but relaxed. Pull hands apart (start). Keeping elbows straight, bring arms up and over head, hands toward floor. Keep pull steady on band. Hold momentarily. Return slowly, keeping pull steady, back to start. Repeat __5_ times. Band color __red____   Side Pull: Double Arm   On back, knees bent, feet flat. Arms perpendicular to body, shoulder level, elbows straight but relaxed. Pull arms out to sides, elbows straight. Resistance band comes across collarbones, hands toward floor. Hold momentarily. Slowly return to starting position. Repeat _5__ times. Band color ___red__   Elmer Picker   On back, knees bent, feet flat, left hand on left hip, right hand above left. Pull right arm DIAGONALLY (hip to shoulder) across chest. Bring right arm along head toward floor. Hold momentarily. Slowly return to starting position. Repeat _5__ times. Do with left arm. Band color ___red___   Shoulder Rotation: Double Arm   On back, knees bent, feet flat, elbows tucked at sides, bent 90, hands palms up. Pull hands apart and down toward floor, keeping elbows near sides. Hold momentarily. Slowly return to starting position. Repeat _5_ times. Band color ___red___     [facility] [address], [place], [place] [address] Phone # [phone] Fax [phone]

## 2018-09-09 NOTE — Therapy (Signed)
[facility] [address], [place], [place], [address] Phone: [phone]   Fax:  [phone]  Physical Therapy Treatment  Patient Details  Name: LOREN VICENS MRN: [id number] Date of Birth: 14-Apr-1956 Referring Provider (PT): Tenna Delaine, MD   Encounter Date: 09/09/2018  PT End of Session - 09/09/18 0907    Visit Number  3    Date for PT Re-Evaluation  10/28/18    PT Start Time  0846    PT Stop Time  0935    PT Time Calculation (min)  49 min    Activity Tolerance  Patient tolerated treatment well       Past Medical History:  Diagnosis Date  . Lymphoma [facility])     Past Surgical History:  Procedure Laterality Date  . CESAREAN SECTION      There were no vitals filed for this visit.  Subjective Assessment - 09/09/18 0849    Subjective  Mild soreness from DN but it felt helpful.  I feel good.  Did yoga yesterday.  Less tightness in the shoulders   than last time in my neck.  Jaw not painful just clicking.      Pertinent History  Non Hodgkins Lymphoma- diagnosed 2016.  Nodule in Lt neck with radiation 12/2017    Currently in Pain?  Yes    Pain Score  1     Pain Location  Neck    Pain Orientation  Right;Left                       [facility] Adult PT Treatment/Exercise - 09/09/18 0001      Neck Exercises: Theraband   Shoulder External Rotation  10 reps;Red    Shoulder External Rotation Limitations  with deep cervical flexion    Horizontal ABduction  10 reps;Red    Horizontal ABduction Limitations  with deep cervical flexion     Other Theraband Exercises  deep cervical flexor contraction with red band flexion 8x    Other Theraband Exercises  diagonals 5x right/left with deep cervical flexion       Neck Exercises: Supine   Capital Flexion  10 reps      Moist Heat Therapy   Number Minutes Moist Heat  12 Minutes    Moist Heat Location  Cervical      Electrical Stimulation   Electrical  Stimulation Location  cervical bil    Electrical Stimulation Action  IFC    Electrical Stimulation Parameters  10 ma 12 min supine    Electrical Stimulation Goals  Pain      Manual Therapy   Soft tissue mobilization  bil cervical paraspinals, suboccipitals, upper traps       Trigger Point Dry Needling - 09/09/18 1728    Consent Given?  Yes    Muscles Treated Upper Body  --   bil cervical multifidi   Upper Trapezius Response  Twitch reponse elicited;Palpable increased muscle length    SubOccipitals Response  Palpable increased muscle length           PT Education - 09/09/18 1137    Education Details  supine band ex with deep cervical nod 5x each  using red band    Person(s) Educated  Patient    Methods  Explanation;Demonstration;Handout    Comprehension  Returned demonstration;Verbalized understanding       PT Short Term Goals - 09/02/18 1331      PT SHORT TERM GOAL #1  Title  be independent in initial HEP    Time  4    Period  Weeks    Status  New    Target Date  09/30/18      PT SHORT TERM GOAL #2   Title  report a 25% reduction in cervical and thoracic stiffness with daily activity    Time  4    Period  Weeks    Status  New    Target Date  09/30/18        PT Long Term Goals - 09/02/18 1247      PT LONG TERM GOAL #1   Title  be independent in advanced HEP    Time  8    Period  Weeks    Status  New    Target Date  10/28/18      PT LONG TERM GOAL #2   Title  reduce FOTO to < or = to 31% limitation    Time  8    Period  Weeks    Status  New    Target Date  10/28/18      PT LONG TERM GOAL #3   Title  report a 75% reduction in cervical and thoracic stiffness with daily activity    Time  8    Period  Weeks    Status  New    Target Date  10/28/18      PT LONG TERM GOAL #4   Title  demonstrate Rt=Lt cervical sidebending to improve cervical mobility    Time  8    Period  Weeks    Status  New    Target Date  10/28/18            Plan -  09/09/18 1729    Clinical Impression Statement  The patient has fewer tender points and improved soft tissue length compared to last visit.  She had a good initial response to DN and manual therapy.  Minimal cues to activate deep cervical flexors without using SCM and scalene muscles.  Good response with ES/heat.  Therapist closely monitoring response with all treatment interventions.      Rehab Potential  Excellent    PT Frequency  2x / week    PT Duration  8 weeks    PT Treatment/Interventions  ADLs/Self Care Home Management;Cryotherapy;Electrical Stimulation;Traction;Therapeutic activities;Therapeutic exercise;Functional mobility training;Patient/family education;Neuromuscular re-education;Manual techniques;Passive range of motion;Taping;Dry needling    PT Next Visit Plan  assess response to dry needling to bil upper traps and cervical/thoracic multifidi #2, thoracic/cervical segmental mobility,  Deep cervical flexor strength;  ES/heat as needed    PT Home Exercise Plan  Access Code: [email or web address]        Patient will benefit from skilled therapeutic intervention in order to improve the following deficits and impairments:  Pain, Increased fascial restricitons, Increased muscle spasms, Decreased mobility, Impaired flexibility  Visit Diagnosis: Cervicalgia  Cramp and spasm  Pain in thoracic spine     Problem List Patient Active Problem List   Diagnosis Date Noted  . Acute deep vein thrombosis (DVT) of femoral vein ([place]) 12/19/2017  . Fatigue 12/03/2017  . Essential hypertension 03/14/2016  . Follicular lymphoma grade II of lymph nodes of multiple sites ([place]) 04/06/2015  . Vitiligo 01/22/2012   Ruben Im, PT 09/09/18 5:33 PM Phone: [phone] Fax: [phone]  Alvera Singh 09/09/2018, 5:32 PM  [facility] [address] [place], [place], [address] Phone: [phone]   Fax:  [phone]  Name: SAFIA PANZER MRN: [id number] Date of Birth: 09/02/1956

## 2018-09-10 ENCOUNTER — Encounter

## 2018-09-11 ENCOUNTER — Ambulatory Visit: Payer: No Typology Code available for payment source

## 2018-09-11 DIAGNOSIS — M542 Cervicalgia: Secondary | ICD-10-CM | POA: Diagnosis not present

## 2018-09-11 DIAGNOSIS — R252 Cramp and spasm: Secondary | ICD-10-CM

## 2018-09-11 DIAGNOSIS — M546 Pain in thoracic spine: Secondary | ICD-10-CM

## 2018-09-11 NOTE — Therapy (Signed)
[facility] [address], [place], [place], [address] Phone: [phone]   Fax:  [phone]  Physical Therapy Treatment  Patient Details  Name: Breanna Kirby MRN: [id number] Date of Birth: 1955/10/12 Referring Provider (PT): Tenna Delaine, MD   Encounter Date: 09/11/2018  PT End of Session - 09/11/18 0846    Visit Number  4    Date for PT Re-Evaluation  10/28/18    PT Start Time  0803    PT Stop Time  0844    PT Time Calculation (min)  41 min    Activity Tolerance  Patient tolerated treatment well    Behavior During Therapy  [facility] for tasks assessed/performed       Past Medical History:  Diagnosis Date  . Lymphoma [facility])     Past Surgical History:  Procedure Laterality Date  . CESAREAN SECTION      There were no vitals filed for this visit.  Subjective Assessment - 09/11/18 0807    Subjective  I think the dry needling is helping.  I am just a [place] sore.      Pertinent History  Non Hodgkins Lymphoma- diagnosed 2016.  Nodule in Lt neck with radiation 12/2017    Currently in Pain?  Yes    Pain Score  1     Pain Location  Neck    Pain Orientation  Right;Left    Pain Descriptors / Indicators  Sore    Pain Type  Chronic pain    Pain Onset  More than a month ago    Pain Frequency  Constant    Aggravating Factors   stiffness    Pain Relieving Factors  yoga, stretching                       [facility] Adult PT Treatment/Exercise - 09/11/18 0001      Exercises   Exercises  Lumbar      Neck Exercises: Machines for Strengthening   UBE (Upper Arm Bike)  Level 1 x 6 minutes (3/3)   seated on green ball     Neck Exercises: Theraband   Shoulder External Rotation  10 reps;Red    Shoulder External Rotation Limitations  with deep cervical flexion    Horizontal ABduction  10 reps;Red    Horizontal ABduction Limitations  with deep cervical flexion     Other Theraband Exercises  deep cervical  flexor contraction with red band flexion 8x    Other Theraband Exercises  diagonals 10x right/left with deep cervical flexion       Neck Exercises: Seated   Other Seated Exercise  cervicothoraic rotation 3x20 seconds      Neck Exercises: Supine   Capital Flexion  10 reps      Lumbar Exercises: Sidelying   Other Sidelying Lumbar Exercises  open book stretch: x20 each             PT Education - 09/11/18 0838    Education Details  Access Code: [email or web address]    Methods  Explanation;Demonstration;Handout    Comprehension  Verbalized understanding;Returned demonstration       PT Short Term Goals - 09/11/18 0810      PT SHORT TERM GOAL #1   Title  be independent in initial HEP    Status  Achieved      PT SHORT TERM GOAL #2   Title  report a 25% reduction in cervical and thoracic stiffness with daily activity  Baseline  30%     Status  Achieved        PT Long Term Goals - 09/02/18 1247      PT LONG TERM GOAL #1   Title  be independent in advanced HEP    Time  8    Period  Weeks    Status  New    Target Date  10/28/18      PT LONG TERM GOAL #2   Title  reduce FOTO to < or = to 31% limitation    Time  8    Period  Weeks    Status  New    Target Date  10/28/18      PT LONG TERM GOAL #3   Title  report a 75% reduction in cervical and thoracic stiffness with daily activity    Time  8    Period  Weeks    Status  New    Target Date  10/28/18      PT LONG TERM GOAL #4   Title  demonstrate Rt=Lt cervical sidebending to improve cervical mobility    Time  8    Period  Weeks    Status  New    Target Date  10/28/18            Plan - 09/11/18 0818    Clinical Impression Statement  Pt reports 30% reduction in thoracic and upper trap pain since the start of care.  Session today focused on technique with HEP and tactile and verbal cues were provided by PT for technique to activate scapular muscles and perform with correct cervical alignment.  Pt was able to perform  correctly.  Pt with continued underlying stiffness in the neck and thoracic spine and will continue to benefit from skilled PT for manual for flexibility and progression of strength and flexibility exercises.      Rehab Potential  Excellent    PT Frequency  2x / week    PT Duration  8 weeks    PT Treatment/Interventions  ADLs/Self Care Home Management;Cryotherapy;Electrical Stimulation;Traction;Therapeutic activities;Therapeutic exercise;Functional mobility training;Patient/family education;Neuromuscular re-education;Manual techniques;Passive range of motion;Taping;Dry needling    PT Next Visit Plan  dry needling to upper traps and bil cervical and thoracic multifidi, flexibility and strength    PT Home Exercise Plan  Access Code: [email or web address]     Recommended Other Services  initial cert is signed    Consulted and Agree with Plan of Care  Patient       Patient will benefit from skilled therapeutic intervention in order to improve the following deficits and impairments:  Pain, Increased fascial restricitons, Increased muscle spasms, Decreased mobility, Impaired flexibility  Visit Diagnosis: Cervicalgia  Cramp and spasm  Pain in thoracic spine     Problem List Patient Active Problem List   Diagnosis Date Noted  . Acute deep vein thrombosis (DVT) of femoral vein ([place]) 12/19/2017  . Fatigue 12/03/2017  . Essential hypertension 03/14/2016  . Follicular lymphoma grade II of lymph nodes of multiple sites ([place]) 04/06/2015  . Vitiligo 01/22/2012    Sigurd Sos, PT 09/11/18 8:48 AM  [facility] [address] [place], [place], [address] Phone: [phone]   Fax:  [phone]  Name: COLLIE KITTEL MRN: [id number] Date of Birth: May 01, 1956

## 2018-09-11 NOTE — Patient Instructions (Addendum)
Cervico-Thoracic: Extension / Rotation (Sitting)    Reach across body with left arm and grasp back of chair. Gently look over right side shoulder. Hold _20___ seconds. Relax. Repeat __3__ times per set. Do __1__ sets per session. Do __3__ sessions per day.  http://orth.exer.us/981   Copyright  VHI. All rights reserved.  Access Code: [email or web address]  URL: [email or web address]  Date: 09/11/2018  Prepared by: Sigurd Sos   Exercises Sidelying Open Book Thoracic Rotation with Knee on Foam Roll - 10 reps - 1 sets - 3x daily - 7x weekly

## 2018-09-23 ENCOUNTER — Encounter

## 2018-09-25 ENCOUNTER — Ambulatory Visit: Payer: No Typology Code available for payment source | Attending: Physician Assistant | Admitting: Physical Therapy

## 2018-09-25 ENCOUNTER — Encounter: Payer: Self-pay | Admitting: Physical Therapy

## 2018-09-25 DIAGNOSIS — M546 Pain in thoracic spine: Secondary | ICD-10-CM

## 2018-09-25 DIAGNOSIS — R252 Cramp and spasm: Secondary | ICD-10-CM

## 2018-09-25 DIAGNOSIS — M542 Cervicalgia: Secondary | ICD-10-CM | POA: Diagnosis not present

## 2018-09-25 NOTE — Therapy (Signed)
[facility] [address] [place], [place], [address] Phone: [phone]   Fax:  [phone]  Physical Therapy Treatment  Patient Details  Name: Breanna Kirby MRN: [id number] Date of Birth: 1956/02/06 Referring Provider (PT): Tenna Delaine, MD   Encounter Date: 09/25/2018  PT End of Session - 09/25/18 0821    Visit Number  5    Date for PT Re-Evaluation  10/28/18    PT Start Time  0800    PT Stop Time  0838    PT Time Calculation (min)  38 min    Activity Tolerance  Patient tolerated treatment well       Past Medical History:  Diagnosis Date  . Lymphoma [facility])     Past Surgical History:  Procedure Laterality Date  . CESAREAN SECTION      There were no vitals filed for this visit.  Subjective Assessment - 09/25/18 0800    Subjective  I felt good while traveling to [place].  My neck did well.   Went to the movies yesterday and my back hurt a [place] after.  My right arm seems a [place] weaker.       Pertinent History  Non Hodgkins Lymphoma- diagnosed 2016.  Nodule in Lt neck with radiation 12/2017    Currently in Pain?  No/denies    Pain Score  0-No pain    Pain Location  Neck    Aggravating Factors   doing the dishes when arms extended forward especially repetitive;  feel it with painting sometimes                        [facility] Adult PT Treatment/Exercise - 09/25/18 0001      Neck Exercises: Theraband   Shoulder External Rotation  10 reps;Red    Shoulder External Rotation Limitations  single arm     Horizontal ABduction  10 reps;Red    Horizontal ABduction Limitations  single arm     Other Theraband Exercises  diagonals red band 10x right/left with deep cervical flexion       Neck Exercises: Standing   Other Standing Exercises  steering wheels 10x    Other Standing Exercises  backward Cs on wall 10x      Neck Exercises: Supine   Capital Flexion  10 reps             PT  Education - 09/25/18 0840    Education Details  red band        PT Short Term Goals - 09/11/18 0810      PT SHORT TERM GOAL #1   Title  be independent in initial HEP    Status  Achieved      PT SHORT TERM GOAL #2   Title  report a 25% reduction in cervical and thoracic stiffness with daily activity    Baseline  30%     Status  Achieved        PT Long Term Goals - 09/02/18 1247      PT LONG TERM GOAL #1   Title  be independent in advanced HEP    Time  8    Period  Weeks    Status  New    Target Date  10/28/18      PT LONG TERM GOAL #2   Title  reduce FOTO to < or = to 31% limitation    Time  8    Period  Weeks  Status  New    Target Date  10/28/18      PT LONG TERM GOAL #3   Title  report a 75% reduction in cervical and thoracic stiffness with daily activity    Time  8    Period  Weeks    Status  New    Target Date  10/28/18      PT LONG TERM GOAL #4   Title  demonstrate Rt=Lt cervical sidebending to improve cervical mobility    Time  8    Period  Weeks    Status  New    Target Date  10/28/18            Plan - 09/25/18 2200    Clinical Impression Statement  The patient is improved with symptom reduction.  She continues be compliant with HEP and demonstrates good technique with deep cervical flexor activation.  No active tender points and improved soft tissue mobility noted today.  She is progressing with rehab goals and may be ready to decrease treatment frequency.  Therapist providing verbal cues with exercises and monitoring effect or production of symptoms.  She denies pain during treatment session.      Rehab Potential  Excellent    PT Frequency  2x / week    PT Duration  8 weeks    PT Treatment/Interventions  ADLs/Self Care Home Management;Cryotherapy;Electrical Stimulation;Traction;Therapeutic activities;Therapeutic exercise;Functional mobility training;Patient/family education;Neuromuscular re-education;Manual techniques;Passive range of  motion;Taping;Dry needling    PT Next Visit Plan  dry needling as needed to upper traps and bil cervical and thoracic multifidi, flexibility and strength; decrease treatment frequency if still doing well;  recheck progress with FOTO     PT Home Exercise Plan  Access Code: [email or web address]        Patient will benefit from skilled therapeutic intervention in order to improve the following deficits and impairments:  Pain, Increased fascial restricitons, Increased muscle spasms, Decreased mobility, Impaired flexibility  Visit Diagnosis: Cervicalgia  Cramp and spasm  Pain in thoracic spine     Problem List Patient Active Problem List   Diagnosis Date Noted  . Acute deep vein thrombosis (DVT) of femoral vein ([place]) 12/19/2017  . Fatigue 12/03/2017  . Essential hypertension 03/14/2016  . Follicular lymphoma grade II of lymph nodes of multiple sites ([place]) 04/06/2015  . Vitiligo 01/22/2012   Ruben Im, PT 09/25/18 10:08 PM Phone: [phone] Fax: [phone] Alvera Singh 09/25/2018, 10:07 PM  [facility] [address], [place], [place], [address] Phone: [phone]   Fax:  [phone]  Name: Breanna Kirby MRN: [id number] Date of Birth: [birth year]

## 2018-09-25 NOTE — Patient Instructions (Signed)
Looped red band:  Standing with band around wrists with your hands on a steering wheel.  Flip top to bottom and then switch.  Slight nod to activate deep cervical flexors.  10-15 reps    Facing wall with band around hands, pinky side to the wall.  Scoop 3 backward C's up and down the wall.  Slight nod to activate deep cervical flexors.   10-15 reps    Ruben Im PT [facility] [address], [place], [place] [address] Phone # [phone] Fax [phone]

## 2018-09-29 ENCOUNTER — Encounter: Payer: Self-pay | Admitting: Physical Therapy

## 2018-09-29 ENCOUNTER — Ambulatory Visit: Payer: No Typology Code available for payment source | Admitting: Physical Therapy

## 2018-09-29 DIAGNOSIS — M546 Pain in thoracic spine: Secondary | ICD-10-CM

## 2018-09-29 DIAGNOSIS — M542 Cervicalgia: Secondary | ICD-10-CM

## 2018-09-29 DIAGNOSIS — R252 Cramp and spasm: Secondary | ICD-10-CM

## 2018-09-29 NOTE — Therapy (Signed)
[facility] [address] [place], [place], [address] Phone: [phone]   Fax:  [phone]  Physical Therapy Treatment  Patient Details  Name: Breanna Kirby MRN: [id number] Date of Birth: May 13, 1956 Referring Provider (PT): Tenna Delaine, MD   Encounter Date: 09/29/2018  PT End of Session - 09/29/18 1018    Visit Number  6    Date for PT Re-Evaluation  10/28/18    PT Start Time  1018    PT Stop Time  1101    PT Time Calculation (min)  43 min    Activity Tolerance  Patient tolerated treatment well    Behavior During Therapy  [facility] for tasks assessed/performed       Past Medical History:  Diagnosis Date  . Lymphoma [facility])     Past Surgical History:  Procedure Laterality Date  . CESAREAN SECTION      There were no vitals filed for this visit.                    [facility] Adult PT Treatment/Exercise - 09/29/18 0001      Neck Exercises: Supine   Other Supine Exercise  Serratus anterior in modifed plank at counter top and with 3# weight in supine. 2x10-15    TC to increase awareness of muscle action.    Other Supine Exercise  red band  horizontal abd on foam roll 10x, VC to decelerate with scap muscles.             PT Education - 09/29/18 1037    Education Details  Serratus ant HEP in standing and supine, educated Pt in better technique with her yoga exercises that involve weightbearing into her UE ( downdog/plank).    Person(s) Educated  Patient    Methods  Explanation;Demonstration;Tactile cues;Verbal cues;Handout    Comprehension  Returned demonstration;Verbalized understanding       PT Short Term Goals - 09/11/18 0810      PT SHORT TERM GOAL #1   Title  be independent in initial HEP    Status  Achieved      PT SHORT TERM GOAL #2   Title  report a 25% reduction in cervical and thoracic stiffness with daily activity    Baseline  30%     Status  Achieved        PT Long  Term Goals - 09/02/18 1247      PT LONG TERM GOAL #1   Title  be independent in advanced HEP    Time  8    Period  Weeks    Status  New    Target Date  10/28/18      PT LONG TERM GOAL #2   Title  reduce FOTO to < or = to 31% limitation    Time  8    Period  Weeks    Status  New    Target Date  10/28/18      PT LONG TERM GOAL #3   Title  report a 75% reduction in cervical and thoracic stiffness with daily activity    Time  8    Period  Weeks    Status  New    Target Date  10/28/18      PT LONG TERM GOAL #4   Title  demonstrate Rt=Lt cervical sidebending to improve cervical mobility    Time  8    Period  Weeks    Status  New  Target Date  10/28/18            Plan - 09/29/18 1018    Clinical Impression Statement  pt just came from yoga class and feels like she aggrevated her RT arm, maybe in her neck some?  We reviewed her technique performing yoga poses that involve weighbearing into the UE. It is possible she is not using her upper back and serratus anterior to stabilize her shoulders. Rt serratus does not activate like the LT. With much practice and repetition pt showed progress in terms of activating the correct muscles and consequently did not have any adverse symptoms in her Rt arm and neck. These exercises were given for HEP to practice.      Rehab Potential  Excellent    PT Frequency  2x / week    PT Duration  8 weeks    PT Treatment/Interventions  ADLs/Self Care Home Management;Cryotherapy;Electrical Stimulation;Traction;Therapeutic activities;Therapeutic exercise;Functional mobility training;Patient/family education;Neuromuscular re-education;Manual techniques;Passive range of motion;Taping;Dry needling    PT Next Visit Plan  Folow up with serratus exercises, mid and upper back strength and endurance. DN if needed.     PT Home Exercise Plan  Access Code: [email or web address]     Consulted and Agree with Plan of Care  Patient       Patient will benefit from skilled  therapeutic intervention in order to improve the following deficits and impairments:  Pain, Increased fascial restricitons, Increased muscle spasms, Decreased mobility, Impaired flexibility  Visit Diagnosis: Cervicalgia  Cramp and spasm  Pain in thoracic spine     Problem List Patient Active Problem List   Diagnosis Date Noted  . Acute deep vein thrombosis (DVT) of femoral vein ([place]) 12/19/2017  . Fatigue 12/03/2017  . Essential hypertension 03/14/2016  . Follicular lymphoma grade II of lymph nodes of multiple sites ([place]) 04/06/2015  . Vitiligo 01/22/2012    Avrian Delfavero, PTA 09/29/2018, 2:25 PM   [facility] [address], [place], [place], [address] Phone: [phone]   Fax:  [phone]  Name: Breanna Kirby MRN: [id number] Date of Birth: 26-Jul-1956

## 2018-10-01 ENCOUNTER — Ambulatory Visit: Payer: No Typology Code available for payment source

## 2018-10-01 DIAGNOSIS — M542 Cervicalgia: Secondary | ICD-10-CM | POA: Diagnosis not present

## 2018-10-01 DIAGNOSIS — R252 Cramp and spasm: Secondary | ICD-10-CM

## 2018-10-01 DIAGNOSIS — M546 Pain in thoracic spine: Secondary | ICD-10-CM

## 2018-10-01 NOTE — Therapy (Signed)
[facility] [address], [place], [place], [address] Phone: [phone]   Fax:  [phone]  Physical Therapy Treatment  Patient Details  Name: Breanna Kirby MRN: [id number] Date of Birth: 08/15/1956 Referring Provider (PT): Tenna Delaine, MD   Encounter Date: 10/01/2018  PT End of Session - 10/01/18 1103    Visit Number  7    Date for PT Re-Evaluation  10/28/18    Authorization Type  [employer]    PT Start Time  1017    PT Stop Time  1058    PT Time Calculation (min)  41 min    Activity Tolerance  Patient tolerated treatment well    Behavior During Therapy  [facility] for tasks assessed/performed       Past Medical History:  Diagnosis Date  . Lymphoma [facility])     Past Surgical History:  Procedure Laterality Date  . CESAREAN SECTION      There were no vitals filed for this visit.  Subjective Assessment - 10/01/18 1022    Subjective  I would like to do a real plank and have the muscle memory.  More than 50% improvement in symptoms since the start of care.      Pertinent History  Non Hodgkins Lymphoma- diagnosed 2016.  Nodule in Lt neck with radiation 12/2017    Patient Stated Goals  reduce neck and thoracic pain    Currently in Pain?  No/denies                       [facility] Adult PT Treatment/Exercise - 10/01/18 0001      Exercises   Exercises  Shoulder      Neck Exercises: Machines for Strengthening   UBE (Upper Arm Bike)  Level 1 x 6 minutes (3/3)   seated on blue ball     Neck Exercises: Theraband   Other Theraband Exercises  diagonals red band 10x right/left with deep cervical flexion    on foam roll     Neck Exercises: Supine   Other Supine Exercise  red band  horizontal abd on foam roll 10x, VC to decelerate with scap muscles.      Lumbar Exercises: Prone   Other Prone Lumbar Exercises  plank on forearms x 2 with tactile and verbal cues for form      Shoulder Exercises: Supine   Horizontal ABduction  Strengthening;Both;20 reps;Theraband    Theraband Level (Shoulder Horizontal ABduction)  Level 2 (Red)    Horizontal ABduction Limitations  on foam roll    External Rotation  Strengthening;Both;20 reps;Theraband    Theraband Level (Shoulder External Rotation)  Level 2 (Red)    External Rotation Limitations  on foam roll             PT Education - 10/01/18 1103    Education Details  discussion regarding yoga and pilates    Person(s) Educated  Patient    Methods  Explanation;Demonstration;Handout    Comprehension  Verbalized understanding;Returned demonstration       PT Short Term Goals - 10/01/18 1026      PT SHORT TERM GOAL #1   Title  be independent in initial HEP    Status  Achieved      PT SHORT TERM GOAL #2   Title  report a 25% reduction in cervical and thoracic stiffness with daily activity    Status  Achieved        PT Long Term Goals -  10/01/18 1026      PT LONG TERM GOAL #1   Title  be independent in advanced HEP    Time  8    Period  Weeks    Status  On-going      PT LONG TERM GOAL #3   Title  report a 75% reduction in cervical and thoracic stiffness with daily activity    Baseline  50%     Time  8    Period  Weeks    Status  On-going            Plan - 10/01/18 1028    Clinical Impression Statement  Pt reports 50% overall improvement in symptoms since the start of care.  Pt is active with HEP for postural strength, stability and flexibility.  Pt with improved postural awareness and demonstrates postural weakness with endurance activities.  Pt requires tactile and demo cues for activation of serratus anterior to stabilize with exercise.  Pt will continue to benefit from skilled PT for strength, flexibility and manual as needed for pain.      Rehab Potential  Excellent    PT Frequency  2x / week    PT Duration  8 weeks    PT Treatment/Interventions  ADLs/Self Care Home Management;Cryotherapy;Electrical  Stimulation;Traction;Therapeutic activities;Therapeutic exercise;Functional mobility training;Patient/family education;Neuromuscular re-education;Manual techniques;Passive range of motion;Taping;Dry needling    PT Next Visit Plan  mid and upper back strength and endurance. DN if needed.     PT Home Exercise Plan  Access Code: [email or web address]     Consulted and Agree with Plan of Care  Patient       Patient will benefit from skilled therapeutic intervention in order to improve the following deficits and impairments:  Pain, Increased fascial restricitons, Increased muscle spasms, Decreased mobility, Impaired flexibility  Visit Diagnosis: Cervicalgia  Cramp and spasm  Pain in thoracic spine     Problem List Patient Active Problem List   Diagnosis Date Noted  . Acute deep vein thrombosis (DVT) of femoral vein ([place]) 12/19/2017  . Fatigue 12/03/2017  . Essential hypertension 03/14/2016  . Follicular lymphoma grade II of lymph nodes of multiple sites ([place]) 04/06/2015  . Vitiligo 01/22/2012    Sigurd Sos, PT 10/01/18 11:05 AM  [facility] [address], [place], [place], [address] Phone: [phone]   Fax:  [phone]  Name: Breanna Kirby MRN: [id number] Date of Birth: [birth year]

## 2018-10-06 ENCOUNTER — Ambulatory Visit: Payer: No Typology Code available for payment source

## 2018-10-06 DIAGNOSIS — M542 Cervicalgia: Secondary | ICD-10-CM

## 2018-10-06 DIAGNOSIS — R252 Cramp and spasm: Secondary | ICD-10-CM

## 2018-10-06 DIAGNOSIS — M546 Pain in thoracic spine: Secondary | ICD-10-CM

## 2018-10-06 NOTE — Therapy (Signed)
[facility] [address] [place], [place], [address] Phone: [phone]   Fax:  [phone]  Physical Therapy Treatment  Patient Details  Name: Breanna Kirby MRN: [id number] Date of Birth: 03/17/1956 Referring Provider (PT): Tenna Delaine, MD   Encounter Date: 10/06/2018  PT End of Session - 10/06/18 1140    Visit Number  8    Date for PT Re-Evaluation  10/28/18    PT Start Time  1057    PT Stop Time  1138    PT Time Calculation (min)  41 min    Activity Tolerance  Patient tolerated treatment well    Behavior During Therapy  [facility] for tasks assessed/performed       Past Medical History:  Diagnosis Date  . Lymphoma [facility])     Past Surgical History:  Procedure Laterality Date  . CESAREAN SECTION      There were no vitals filed for this visit.  Subjective Assessment - 10/06/18 1110    Subjective  I went to a yoga class on Thursday and went to a cardio/weight training class today.      Currently in Pain?  No/denies                       [facility] Adult PT Treatment/Exercise - 10/06/18 0001      Neck Exercises: Machines for Strengthening   UBE (Upper Arm Bike)  Level 1 x 6 minutes (3/3)   seated on blue ball     Neck Exercises: Theraband   Other Theraband Exercises  diagonals red band 10x right/left with deep cervical flexion    on foam roll     Neck Exercises: Supine   Other Supine Exercise  red band  horizontal abd on foam roll      Lumbar Exercises: Sidelying   Other Sidelying Lumbar Exercises  open book stretch: x20 each      Shoulder Exercises: Supine   Horizontal ABduction  Strengthening;Both;20 reps;Theraband    Theraband Level (Shoulder Horizontal ABduction)  Level 2 (Red)    Horizontal ABduction Limitations  on foam roll    External Rotation  Strengthening;Both;20 reps;Theraband    Theraband Level (Shoulder External Rotation)  Level 2 (Red)    External Rotation Limitations   on foam roll      Shoulder Exercises: Seated   Other Seated Exercises  seated on green ball: 1# 2x10      Shoulder Exercises: Prone   Flexion  Strengthening;Both;20 reps    Extension  Strengthening;Both;20 reps    Horizontal ABduction 1  Strengthening;Both;20 reps               PT Short Term Goals - 10/01/18 1026      PT SHORT TERM GOAL #1   Title  be independent in initial HEP    Status  Achieved      PT SHORT TERM GOAL #2   Title  report a 25% reduction in cervical and thoracic stiffness with daily activity    Status  Achieved        PT Long Term Goals - 10/01/18 1026      PT LONG TERM GOAL #1   Title  be independent in advanced HEP    Time  8    Period  Weeks    Status  On-going      PT LONG TERM GOAL #3   Title  report a 75% reduction in cervical and thoracic stiffness  with daily activity    Baseline  50%     Time  8    Period  Weeks    Status  On-going            Plan - 10/06/18 1127    Clinical Impression Statement  Pt has been able to participate in higher level exercise at the gym and denies any pain or limitation.  Pt is working to improve technique and alignment with these exercises.  Pt remains weak in the cervical and thoracic postural stabilizers and requires verbal and tactile cues to reduce substitution with these.  Pt will continue to benefit from skilled PT for strength, flexibility and endurance to improve stability and mobility with high level exercise without pain.      PT Treatment/Interventions  ADLs/Self Care Home Management;Cryotherapy;Electrical Stimulation;Traction;Therapeutic activities;Therapeutic exercise;Functional mobility training;Patient/family education;Neuromuscular re-education;Manual techniques;Passive range of motion;Taping;Dry needling    PT Next Visit Plan  mid and upper back strength and endurance. DN if needed.     PT Home Exercise Plan  Access Code: [email or web address]     Consulted and Agree with Plan of Care  Patient        Patient will benefit from skilled therapeutic intervention in order to improve the following deficits and impairments:     Visit Diagnosis: Cervicalgia  Cramp and spasm  Pain in thoracic spine     Problem List Patient Active Problem List   Diagnosis Date Noted  . Acute deep vein thrombosis (DVT) of femoral vein ([place]) 12/19/2017  . Fatigue 12/03/2017  . Essential hypertension 03/14/2016  . Follicular lymphoma grade II of lymph nodes of multiple sites ([place]) 04/06/2015  . Vitiligo 01/22/2012     Sigurd Sos, PT 10/06/18 11:45 AM  [facility] [address], [place], [place], [address] Phone: [phone]   Fax:  [phone]  Name: Breanna Kirby MRN: [id number] Date of Birth: 25-Dec-1955

## 2018-10-08 ENCOUNTER — Ambulatory Visit: Payer: No Typology Code available for payment source | Admitting: Physical Therapy

## 2018-10-08 ENCOUNTER — Encounter: Payer: Self-pay | Admitting: Physical Therapy

## 2018-10-08 DIAGNOSIS — M546 Pain in thoracic spine: Secondary | ICD-10-CM

## 2018-10-08 DIAGNOSIS — R252 Cramp and spasm: Secondary | ICD-10-CM

## 2018-10-08 DIAGNOSIS — M542 Cervicalgia: Secondary | ICD-10-CM

## 2018-10-08 NOTE — Therapy (Signed)
[facility] [address] [place], [place], [address] Phone: [phone]   Fax:  [phone]  Physical Therapy Treatment  Patient Details  Name: Breanna Kirby MRN: [id number] Date of Birth: Sep 02, 1956 Referring Provider (PT): Tenna Delaine, MD   Encounter Date: 10/08/2018  PT End of Session - 10/08/18 1100    Visit Number  9    Date for PT Re-Evaluation  10/28/18    Authorization Type  UHC    PT Start Time  1059   Treatment goals accomplished   PT Stop Time  1135    PT Time Calculation (min)  36 min    Activity Tolerance  Patient tolerated treatment well    Behavior During Therapy  [facility] for tasks assessed/performed       Past Medical History:  Diagnosis Date  . Lymphoma [facility])     Past Surgical History:  Procedure Laterality Date  . CESAREAN SECTION      There were no vitals filed for this visit.  Subjective Assessment - 10/08/18 1104    Subjective  No complaints today. Just came from the gym, did mainly cardio.     Pertinent History  Non Hodgkins Lymphoma- diagnosed 2016.  Nodule in Lt neck with radiation 12/2017    Diagnostic tests  none specific to the neck and thoracic spine.    Currently in Pain?  No/denies    Multiple Pain Sites  No                       [facility] Adult PT Treatment/Exercise - 10/08/18 0001      Lumbar Exercises: Aerobic   UBE (Upper Arm Bike)  sitting on green ball L3 3x3: Vc for posture, Lower ab engagment , and practicing keeping her neck soft             PT Education - 10/08/18 1119    Education Details  Prone exercises for HEP: see pt instructions for typed out instructions   Pt perfromed the entire sequence correctly with initial instruction from PTA   Person(s) Educated  Patient    Methods  Explanation;Demonstration;Tactile cues;Verbal cues;Handout    Comprehension  Returned demonstration;Verbalized understanding       PT Short Term Goals -  10/01/18 1026      PT SHORT TERM GOAL #1   Title  be independent in initial HEP    Status  Achieved      PT SHORT TERM GOAL #2   Title  report a 25% reduction in cervical and thoracic stiffness with daily activity    Status  Achieved        PT Long Term Goals - 10/08/18 1123      PT LONG TERM GOAL #3   Title  report a 75% reduction in cervical and thoracic stiffness with daily activity    Time  8    Period  Weeks    Status  On-going   55%-60%           Plan - 10/08/18 1121    Clinical Impression Statement  Pt arrives for session today just coming from the gym where she did mainly cardio work in preparation for her PT work. Today we focused on advancing her HEP to imclude prone exercises that focused on cervical and thoracic strength and stabilization. Pt also gives a report today of her daily stiffness in her cervical and thoracici areas 55%-60% improved, progressing towards long term goal.  Rehab Potential  Excellent    PT Frequency  2x / week    PT Duration  8 weeks    PT Treatment/Interventions  ADLs/Self Care Home Management;Cryotherapy;Electrical Stimulation;Traction;Therapeutic activities;Therapeutic exercise;Functional mobility training;Patient/family education;Neuromuscular re-education;Manual techniques;Passive range of motion;Taping;Dry needling    PT Next Visit Plan  mid and upper back strength and endurance, review prone exercises,  DN if needed.     PT Home Exercise Plan  Access Code: [email or web address]     Consulted and Agree with Plan of Care  Patient       Patient will benefit from skilled therapeutic intervention in order to improve the following deficits and impairments:  Pain, Increased fascial restricitons, Increased muscle spasms, Decreased mobility, Impaired flexibility  Visit Diagnosis: Cervicalgia  Cramp and spasm  Pain in thoracic spine     Problem List Patient Active Problem List   Diagnosis Date Noted  . Acute deep vein thrombosis (DVT) of  femoral vein ([place]) 12/19/2017  . Fatigue 12/03/2017  . Essential hypertension 03/14/2016  . Follicular lymphoma grade II of lymph nodes of multiple sites ([place]) 04/06/2015  . Vitiligo 01/22/2012    Toure Edmonds, PTA 10/08/2018, 11:37 AM  [facility] [address] [place], [place], [address] Phone: [phone]   Fax:  [phone]  Name: Breanna Kirby MRN: [id number] Date of Birth: 12-27-1955

## 2018-10-08 NOTE — Patient Instructions (Signed)
First module in prone  - get organized: leg reach, pelvic press, lift of lower abdomin, and arms by your side with palms up  1. Glide/reach of the arms towards your feet. Hold 2-3 sec do 5x 2. Add an arm lift after you glide and KEEP YOUR REACH, hold 2-3 sec and do 5x 3. Repeat #3 again but add a forehead lift, hold everything 2-3 sec, do 5x   Second module  Arms go out to the "T" position   1. Arm lifts in the "T" up and down focusing on the reach [place] 5x,  2 Add circles ( small ) 5x in each direction  3. Add the forehead lift with above circles. 5 x each direction  Third module Elbows now bend and you are in a "W" posiiton  1. Lift the "W" head down 5x, holding 2-3 sec 2. Then add forehead lift with "W" lift 5x

## 2018-10-20 ENCOUNTER — Ambulatory Visit: Payer: No Typology Code available for payment source

## 2018-10-20 ENCOUNTER — Telehealth: Payer: Self-pay | Admitting: Family Medicine

## 2018-10-20 DIAGNOSIS — Z1231 Encounter for screening mammogram for malignant neoplasm of breast: Secondary | ICD-10-CM

## 2018-10-20 DIAGNOSIS — M542 Cervicalgia: Secondary | ICD-10-CM

## 2018-10-20 DIAGNOSIS — M546 Pain in thoracic spine: Secondary | ICD-10-CM

## 2018-10-20 DIAGNOSIS — R252 Cramp and spasm: Secondary | ICD-10-CM

## 2018-10-20 NOTE — Therapy (Signed)
[facility] [address] [place], [place], [address] Phone: [phone]   Fax:  [phone]  Physical Therapy Treatment  Patient Details  Name: Breanna Kirby MRN: [id number] Date of Birth: August 03, 1956 Referring Provider (PT): Tenna Delaine, MD   Encounter Date: 10/20/2018  PT End of Session - 10/20/18 1133    Visit Number  10    Date for PT Re-Evaluation  10/28/18    PT Start Time  1101    PT Stop Time  1133    PT Time Calculation (min)  32 min    Activity Tolerance  Patient tolerated treatment well    Behavior During Therapy  [facility] for tasks assessed/performed       Past Medical History:  Diagnosis Date  . Lymphoma [facility])     Past Surgical History:  Procedure Laterality Date  . CESAREAN SECTION      There were no vitals filed for this visit.  Subjective Assessment - 10/20/18 1102    Subjective  I was out of town last week.  I was sore on the plane on the long flight.      Currently in Pain?  No/denies         [facility] PT Assessment - 10/20/18 0001      Observation/Other Assessments   Focus on Therapeutic Outcomes (FOTO)   36% limited      AROM   Overall AROM   Deficits    AROM Assessment Site  Cervical    Cervical - Right Side Bend  50    Cervical - Left Side Bend  50                   [facility] Adult PT Treatment/Exercise - 10/20/18 0001      Neck Exercises: Machines for Strengthening   UBE (Upper Arm Bike)  Level 1 x 6 minutes (3/3)   seated on blue ball     Neck Exercises: Theraband   Other Theraband Exercises  diagonals green band 10x right/left with deep cervical flexion    on foam roll     Neck Exercises: Supine   Other Supine Exercise  green band  horizontal abd on foam roll 2x10      Neck Exercises: Prone   Other Prone Exercise  Prone series as HEP issued last session: Module 1,2 & 3      Shoulder Exercises: Supine   External Rotation  Strengthening;Both;20  reps;Theraband    Theraband Level (Shoulder External Rotation)  Level 3 (Green)    External Rotation Limitations  on foam roll    Flexion  Strengthening;Both;20 reps;Theraband    Theraband Level (Shoulder Flexion)  Level 3 (Green)               PT Short Term Goals - 10/01/18 1026      PT SHORT TERM GOAL #1   Title  be independent in initial HEP    Status  Achieved      PT SHORT TERM GOAL #2   Title  report a 25% reduction in cervical and thoracic stiffness with daily activity    Status  Achieved        PT Long Term Goals - 10/20/18 1103      PT LONG TERM GOAL #2   Title  reduce FOTO to < or = to 31% limitation    Baseline  36% limitation    Status  Partially Met      PT LONG TERM  GOAL #3   Title  report a 75% reduction in cervical and thoracic stiffness with daily activity    Baseline  80-85%    Status  Achieved      PT LONG TERM GOAL #4   Title  demonstrate Rt=Lt cervical sidebending to improve cervical mobility    Baseline  50 degrees bil     Status  Achieved            Plan - 10/20/18 1114    Clinical Impression Statement  Pt reports 80% overall improvement since the start of care.  Pt denies any pain, just stiffness overall with prolonged sitting.  Pt demonstrates 50 degrees of Rt and Lt cervical sidebending.  Pt demonstrated HEP issued last session without need for verbal cueing.  Pt will attend 1 more session to finalize HEP and D/C to HEP.      Rehab Potential  Excellent    PT Frequency  2x / week    PT Duration  8 weeks    PT Treatment/Interventions  ADLs/Self Care Home Management;Cryotherapy;Electrical Stimulation;Traction;Therapeutic activities;Therapeutic exercise;Functional mobility training;Patient/family education;Neuromuscular re-education;Manual techniques;Passive range of motion;Taping;Dry needling    PT Next Visit Plan  review any exercise if needed, finalize HEP DN if needed. D/C PT    PT Home Exercise Plan  Access Code: [email or web address]      Consulted and Agree with Plan of Care  Patient       Patient will benefit from skilled therapeutic intervention in order to improve the following deficits and impairments:  Pain, Increased fascial restricitons, Increased muscle spasms, Decreased mobility, Impaired flexibility  Visit Diagnosis: Cervicalgia  Cramp and spasm  Pain in thoracic spine     Problem List Patient Active Problem List   Diagnosis Date Noted  . Acute deep vein thrombosis (DVT) of femoral vein ([place]) 12/19/2017  . Fatigue 12/03/2017  . Essential hypertension 03/14/2016  . Follicular lymphoma grade II of lymph nodes of multiple sites ([place]) 04/06/2015  . Vitiligo 01/22/2012    Sigurd Sos, PT 10/20/18 11:41 AM  [facility] [address], [place], [place], [address] Phone: [phone]   Fax:  [phone]  Name: Breanna Kirby MRN: [id number] Date of Birth: 07-26-1956

## 2018-10-20 NOTE — Telephone Encounter (Signed)
Copied from [place]. Topic: Quick Communication - See Telephone Encounter >> Oct 20, 2018  8:46 AM Bea Graff, NT wrote: CRM for notification. See Telephone encounter for: 10/20/18. Pt requesting an order to have a mammogram done.

## 2018-10-22 ENCOUNTER — Encounter: Payer: Self-pay | Admitting: Physical Therapy

## 2018-10-22 ENCOUNTER — Ambulatory Visit: Payer: No Typology Code available for payment source | Admitting: Physical Therapy

## 2018-10-22 DIAGNOSIS — M546 Pain in thoracic spine: Secondary | ICD-10-CM

## 2018-10-22 DIAGNOSIS — M542 Cervicalgia: Secondary | ICD-10-CM | POA: Diagnosis not present

## 2018-10-22 DIAGNOSIS — R252 Cramp and spasm: Secondary | ICD-10-CM

## 2018-10-22 NOTE — Therapy (Addendum)
[facility] [address] [place], [place], [address] Phone: [phone]   Fax:  [phone]  Physical Therapy Treatment  Patient Details  Name: Breanna Kirby MRN: [id number] Date of Birth: 1956-08-28 Referring Provider (PT): Tenna Delaine, MD   Encounter Date: 10/22/2018  PT End of Session - 10/22/18 1104    Visit Number  11    Date for PT Re-Evaluation  10/28/18    Authorization Type  UHC    PT Start Time  1059    PT Stop Time  1132    PT Time Calculation (min)  33 min    Activity Tolerance  Patient tolerated treatment well    Behavior During Therapy  [facility] for tasks assessed/performed       Past Medical History:  Diagnosis Date  . Lymphoma [facility])     Past Surgical History:  Procedure Laterality Date  . CESAREAN SECTION      There were no vitals filed for this visit.  Subjective Assessment - 10/22/18 1105    Subjective  I am ready for discharge today.    Pertinent History  Non Hodgkins Lymphoma- diagnosed 2016.  Nodule in Lt neck with radiation 12/2017    Currently in Pain?  No/denies    Multiple Pain Sites  No         [facility] PT Assessment - 10/22/18 0001      Assessment   Referring Provider (PT)  Tenna Delaine, MD    Onset Date/Surgical Date  06/03/18      Observation/Other Assessments   Focus on Therapeutic Outcomes (FOTO)   36% limited      AROM   Overall AROM   Deficits    AROM Assessment Site  Cervical    Cervical - Right Side Bend  50    Cervical - Left Side Bend  50                   [facility] Adult PT Treatment/Exercise - 10/22/18 0001      Neck Exercises: Machines for Strengthening   UBE (Upper Arm Bike)  Level 2x 6 minutes (3/3)   seated on blue ball     Neck Exercises: Prone   Other Prone Exercise  Prone series as HEP issued last session: Module 1,2 & 3   added "V" position to series did 3x             PT Education - 10/22/18 1134    Education  Details  Discussion of exercise progressions and safety with all her activities/classes she takes, review of chest/pec stretch she did in yoga this AM    Person(s) Educated  Patient    Methods  Explanation    Comprehension  Verbalized understanding       PT Short Term Goals - 10/22/18 1122      PT SHORT TERM GOAL #1   Title  be independent in initial HEP    Time  4    Period  Weeks    Status  Achieved      PT SHORT TERM GOAL #2   Title  report a 25% reduction in cervical and thoracic stiffness with daily activity    Baseline  30%     Time  4    Period  Weeks    Status  Achieved        PT Long Term Goals - 10/22/18 1106      PT LONG TERM GOAL #1  Title  be independent in advanced HEP    Time  8    Period  Weeks    Status  Achieved      PT LONG TERM GOAL #2   Title  reduce FOTO to < or = to 31% limitation    Time  8    Period  Weeks    Status  Partially Met      PT LONG TERM GOAL #3   Title  report a 75% reduction in cervical and thoracic stiffness with daily activity    Baseline  80-85%    Time  8    Period  Weeks    Status  Achieved      PT LONG TERM GOAL #4   Title  demonstrate Rt=Lt cervical sidebending to improve cervical mobility    Baseline  50 degrees bil     Time  8    Period  Weeks    Status  Achieved            Plan - 10/22/18 1104    Rehab Potential  Excellent    PT Frequency  2x / week    PT Duration  8 weeks    PT Treatment/Interventions  ADLs/Self Care Home Management;Cryotherapy;Electrical Stimulation;Traction;Therapeutic activities;Therapeutic exercise;Functional mobility training;Patient/family education;Neuromuscular re-education;Manual techniques;Passive range of motion;Taping;Dry needling    PT Next Visit Plan  DC to HEP    PT Home Exercise Plan  Access Code: [email or web address]     Consulted and Agree with Plan of Care  Patient       Patient will benefit from skilled therapeutic intervention in order to improve the following deficits  and impairments:  Pain, Increased fascial restricitons, Increased muscle spasms, Decreased mobility, Impaired flexibility  Visit Diagnosis: Cervicalgia  Cramp and spasm  Pain in thoracic spine     Problem List Patient Active Problem List   Diagnosis Date Noted  . Acute deep vein thrombosis (DVT) of femoral vein ([place]) 12/19/2017  . Fatigue 12/03/2017  . Essential hypertension 03/14/2016  . Follicular lymphoma grade II of lymph nodes of multiple sites ([place]) 04/06/2015  . Vitiligo 01/22/2012    Myrene Galas, PTA 10/22/18 11:36 AM PHYSICAL THERAPY DISCHARGE SUMMARY  Visits from Start of Care: 11  Current functional level related to goals / functional outcomes: See above for current status.  Pt will D/C to HEP   Remaining deficits: No functional deficits remain.     Education / Equipment: Posture/body mechanics, progression of exercise. Plan: Patient agrees to discharge.  Patient goals were partially met. Patient is being discharged due to being pleased with the current functional level.  ?????       Sigurd Sos, PT 10/22/18 11:39 AM   [facility] [address] [place], [place], [address] Phone: [phone]   Fax:  [phone]  Name: Breanna Kirby MRN: [id number] Date of Birth: 06/09/1956

## 2018-10-24 NOTE — Telephone Encounter (Signed)
Sent in referral and called pt and made pt aware.

## 2018-11-07 ENCOUNTER — Encounter: Payer: Self-pay | Admitting: Family Medicine

## 2018-11-07 ENCOUNTER — Other Ambulatory Visit: Payer: Self-pay

## 2018-11-07 ENCOUNTER — Ambulatory Visit: Payer: No Typology Code available for payment source | Admitting: Family Medicine

## 2018-11-07 ENCOUNTER — Telehealth: Payer: Self-pay | Admitting: Family Medicine

## 2018-11-07 VITALS — BP 132/87 | HR 95 | Temp 98.1°F | Resp 16 | Ht 62.21 in | Wt 109.4 lb

## 2018-11-07 DIAGNOSIS — N952 Postmenopausal atrophic vaginitis: Secondary | ICD-10-CM | POA: Diagnosis not present

## 2018-11-07 DIAGNOSIS — R634 Abnormal weight loss: Secondary | ICD-10-CM

## 2018-11-07 DIAGNOSIS — Z124 Encounter for screening for malignant neoplasm of cervix: Secondary | ICD-10-CM

## 2018-11-07 NOTE — Patient Instructions (Addendum)
  Wt Readings from Last 3 Encounters:  11/07/18 109 lb 6.4 oz (49.6 kg)  08/06/18 103 lb (46.7 kg)  07/16/18 102 lb 12.8 oz (46.6 kg)      If you have lab work done today you will be contacted with your lab results within the next 2 weeks.  If you have not heard from [place] then please contact us. The fastest way to get your results is to register for My Chart.   IF you received an x-ray today, you will receive an invoice from [facility]. Please contact [facility] at [phone] with questions or concerns regarding your invoice.   IF you received labwork today, you will receive an invoice from [place]. Please contact [facility] at [phone] with questions or concerns regarding your invoice.   Our billing staff will not be able to assist you with questions regarding bills from these companies.  You will be contacted with the lab results as soon as they are available. The fastest way to get your results is to activate your My Chart account. Instructions are located on the last page of this paperwork. If you have not heard from [place] regarding the results in 2 weeks, please contact this office.

## 2018-11-07 NOTE — Telephone Encounter (Signed)
Called pt and LVM for pt to call the office - Dr. Nolon Rod has a family emergency and has to leave the office. She will be back this afternoon and we moved the pts appt to 2:20 today  with Dr. Nolon Rod. If pt calls back and is ok with this, please add to appt notes. If pt needs to reschedule , please do so and schedule at pts convenience.

## 2018-11-09 NOTE — Progress Notes (Signed)
Established Patient Office Visit  Subjective:  Patient ID: Breanna Kirby, female    DOB: August 03, 1956  Age: 63 y.o. MRN: [id number]  CC:  Chief Complaint  Patient presents with  . Gynecologic Exam    pap and discuss vaginal dryness and painful during intercourse for 3-4 years    HPI Breanna Kirby presents for gynecologic exam  She also reports vaginal dryness with pain with intercourse and wanted to discuss a non-hormonal option as a lubricant. She denies vaginal itching or bleeding She is postmenopausal and reports that it has been a problem for years but this is her first time discussing it. She reports that she is not concerned about pregnancy and she is monogamous with her husband.   She has not had a pap smear in years She denies history of HPV or cervical cancer and she is a nonsmoker  Weight loss She reports that she has a history of lymphoma and was having weight loss She saw GI who was monitoring her for causes of weight loss Her weight improved so the GI doctor did not do any extreme work up but will monitor She reports that she has added a Diaz more calories which is helping with maintaining her weight.    Past Medical History:  Diagnosis Date  . Lymphoma [facility])     Past Surgical History:  Procedure Laterality Date  . CESAREAN SECTION      No family history on file.  Social History   Socioeconomic History  . Marital status: Married    Spouse name: Not on file  . Number of children: 2  . Years of education: Not on file  . Highest education level: Not on file  Occupational History  . Not on file  Social Needs  . Financial resource strain: Not on file  . Food insecurity:    Worry: Not on file    Inability: Not on file  . Transportation needs:    Medical: Not on file    Non-medical: Not on file  Tobacco Use  . Smoking status: Never Smoker  . Smokeless tobacco: Never Used  Substance and Sexual Activity  . Alcohol use: Yes    Frequency: Never   . Drug use: Never  . Sexual activity: Yes  Lifestyle  . Physical activity:    Days per week: Not on file    Minutes per session: Not on file  . Stress: Not on file  Relationships  . Social connections:    Talks on phone: Not on file    Gets together: Not on file    Attends religious service: Not on file    Active member of club or organization: Not on file    Attends meetings of clubs or organizations: Not on file    Relationship status: Not on file  . Intimate partner violence:    Fear of current or ex partner: Not on file    Emotionally abused: Not on file    Physically abused: Not on file    Forced sexual activity: Not on file  Other Topics Concern  . Not on file  Social History Narrative  . Not on file    Outpatient Medications Prior to Visit  Medication Sig Dispense Refill  . cyclobenzaprine (FLEXERIL) 5 MG tablet Take 1 tablet (5 mg total) by mouth 3 (three) times daily as needed for muscle spasms. (Patient not taking: Reported on 09/02/2018) 60 tablet 0   No facility-administered medications prior to visit.  No Known Allergies  ROS Review of Systems    Objective:    Physical Exam  BP 132/87 (BP Location: Right Arm, Patient Position: Sitting, Cuff Size: Normal)   Pulse 95   Temp 98.1 F (36.7 C) (Oral)   Resp 16   Ht 5' 2.21" (1.58 m)   Wt 109 lb 6.4 oz (49.6 kg)   SpO2 96%   BMI 19.87 kg/m  Wt Readings from Last 3 Encounters:  11/07/18 109 lb 6.4 oz (49.6 kg)  08/06/18 103 lb (46.7 kg)  07/16/18 102 lb 12.8 oz (46.6 kg)   Physical Exam  Constitutional: Oriented to person, place, and time. Appears well-developed and well-nourished.  HENT:  Head: Normocephalic and atraumatic.  Eyes: Conjunctivae and EOM are normal.  Cardiovascular: Normal rate, regular rhythm, normal heart sounds and intact distal pulses.  No murmur heard. Pulmonary/Chest: Effort normal and breath sounds normal. No stridor. No respiratory distress. Has no wheezes.    Neurological: Is alert and oriented to person, place, and time.  Skin: Skin is warm. Capillary refill takes less than 2 seconds.  Psychiatric: Has a normal mood and affect. Behavior is normal. Judgment and thought content normal.   Vaginal exam- Chaperone Present Labia normal bilaterally without skin lesions Urethral meatus with atrophic mucosa without erythema Speculum exam - using a small speculum with gel Vagina with pale thin mucosa without discharge No CMT, ovaries small and not palpable Uterus midline, nontender   Health Maintenance Due  Topic Date Due  . Hepatitis C Screening  1956-03-15  . HIV Screening  02/04/1973  . TETANUS/TDAP  02/04/1977  . COLONOSCOPY  02/05/2008  . PAP SMEAR-Modifier  04/24/2017  . MAMMOGRAM  08/09/2018    There are no preventive care reminders to display for this patient.  Lab Results  Component Value Date   TSH 1.200 04/24/2018   Lab Results  Component Value Date   WBC 13.5 (H) 04/15/2008   HGB 13.6 04/24/2018   HCT 40.0 04/24/2018   MCV 92.4 04/15/2008   PLT 176 04/15/2008   Lab Results  Component Value Date   NA 136 04/24/2018   K 4.0 04/24/2018   CO2 22 04/15/2008   GLUCOSE 94 04/24/2018   BUN 16 04/24/2018   CREATININE 0.90 04/24/2018   CALCIUM 9.3 04/15/2008   No results found for: CHOL No results found for: HDL No results found for: LDLCALC No results found for: TRIG No results found for: CHOLHDL No results found for: HGBA1C    Assessment & Plan:   Problem List Items Addressed This Visit    None    Visit Diagnoses    Loss of weight    -  Primary Improving Weight gain noted   Papanicolaou smear for cervical cancer screening     Pap smear history reviewed and pap smear preformed    Relevant Orders   Pap IG, CT/NG NAA, and HPV (high risk) [facility]   Post-menopausal atrophic vaginitis    - discussed osphena and the risks as well as mineral oil per vaginal and oil or silicone based lube      No orders  of the defined types were placed in this encounter.   Follow-up: Return if symptoms worsen or fail to improve.    Forrest Moron, MD

## 2018-11-12 LAB — PAP IG, CT-NG NAA, HPV HIGH-RISK
CHLAMYDIA, NUC. ACID AMP: NEGATIVE
Gonococcus by Nucleic Acid Amp: NEGATIVE
HPV, HIGH-RISK: NEGATIVE

## 2018-12-04 ENCOUNTER — Ambulatory Visit: Payer: No Typology Code available for payment source

## 2018-12-24 ENCOUNTER — Ambulatory Visit: Payer: No Typology Code available for payment source

## 2018-12-25 ENCOUNTER — Other Ambulatory Visit: Payer: Self-pay | Admitting: Family Medicine

## 2018-12-25 DIAGNOSIS — E2839 Other primary ovarian failure: Secondary | ICD-10-CM

## 2019-02-10 ENCOUNTER — Ambulatory Visit: Payer: No Typology Code available for payment source

## 2019-02-26 ENCOUNTER — Ambulatory Visit: Payer: No Typology Code available for payment source

## 2019-03-19 ENCOUNTER — Other Ambulatory Visit: Payer: Self-pay

## 2019-03-19 ENCOUNTER — Ambulatory Visit
Admission: RE | Admit: 2019-03-19 | Discharge: 2019-03-19 | Disposition: A | Discharge status: Home / Self Care | Payer: No Typology Code available for payment source | Source: Ambulatory Visit | Attending: Family Medicine | Admitting: Family Medicine

## 2019-03-19 DIAGNOSIS — E2839 Other primary ovarian failure: Secondary | ICD-10-CM

## 2019-03-19 DIAGNOSIS — Z1231 Encounter for screening mammogram for malignant neoplasm of breast: Secondary | ICD-10-CM

## 2019-06-08 ENCOUNTER — Other Ambulatory Visit: Payer: Self-pay

## 2019-06-08 DIAGNOSIS — Z20822 Contact with and (suspected) exposure to covid-19: Secondary | ICD-10-CM

## 2019-06-09 LAB — NOVEL CORONAVIRUS, NAA: SARS-CoV-2, NAA: NOT DETECTED

## 2019-07-22 ENCOUNTER — Other Ambulatory Visit: Payer: Self-pay

## 2019-07-22 DIAGNOSIS — Z20822 Contact with and (suspected) exposure to covid-19: Secondary | ICD-10-CM

## 2019-07-23 LAB — NOVEL CORONAVIRUS, NAA: SARS-CoV-2, NAA: NOT DETECTED

## 2019-09-04 ENCOUNTER — Other Ambulatory Visit: Payer: Self-pay

## 2019-09-04 DIAGNOSIS — Z20822 Contact with and (suspected) exposure to covid-19: Secondary | ICD-10-CM

## 2019-09-07 LAB — NOVEL CORONAVIRUS, NAA: SARS-CoV-2, NAA: NOT DETECTED

## 2019-11-28 ENCOUNTER — Ambulatory Visit: Payer: No Typology Code available for payment source | Attending: Internal Medicine

## 2019-11-28 DIAGNOSIS — Z23 Encounter for immunization: Secondary | ICD-10-CM

## 2019-11-28 NOTE — Progress Notes (Signed)
   Covid-19 Vaccination Clinic  Name:  Breanna Kirby    MRN: [id number] DOB: July 14, 1956  11/28/2019  [name] was observed post Covid-19 immunization for 15 minutes without incident. She was provided with Vaccine Information Sheet and instruction to access the V-Safe system.   [name] was instructed to call 911 with any severe reactions post vaccine: Marland Kitchen Difficulty breathing  . Swelling of face and throat  . A fast heartbeat  . A bad rash all over body  . Dizziness and weakness   Immunizations Administered    Name Date Dose VIS Date Route   Pfizer COVID-19 Vaccine 11/28/2019 10:19 AM 0.3 mL 09/04/2019 Intramuscular   Manufacturer: [place]   Lot: [id number]   [place]: [id number]

## 2019-12-04 ENCOUNTER — Ambulatory Visit: Payer: No Typology Code available for payment source

## 2019-12-09 ENCOUNTER — Other Ambulatory Visit: Payer: Self-pay

## 2019-12-09 ENCOUNTER — Emergency Department (HOSPITAL_COMMUNITY)
Admission: EM | Admit: 2019-12-09 | Discharge: 2019-12-09 | Disposition: A | Discharge status: Home / Self Care | Payer: No Typology Code available for payment source | Attending: Emergency Medicine | Admitting: Emergency Medicine

## 2019-12-09 ENCOUNTER — Encounter (HOSPITAL_COMMUNITY): Payer: Self-pay | Admitting: Emergency Medicine

## 2019-12-09 DIAGNOSIS — R040 Epistaxis: Secondary | ICD-10-CM | POA: Insufficient documentation

## 2019-12-09 DIAGNOSIS — Z79899 Other long term (current) drug therapy: Secondary | ICD-10-CM | POA: Insufficient documentation

## 2019-12-09 DIAGNOSIS — Z8572 Personal history of non-Hodgkin lymphomas: Secondary | ICD-10-CM | POA: Diagnosis not present

## 2019-12-09 DIAGNOSIS — I10 Essential (primary) hypertension: Secondary | ICD-10-CM | POA: Insufficient documentation

## 2019-12-09 LAB — HEMOGLOBIN AND HEMATOCRIT, BLOOD
HCT: 39.5 % (ref 36.0–46.0)
Hemoglobin: 12.9 g/dL (ref 12.0–15.0)

## 2019-12-09 MED ORDER — OXYMETAZOLINE HCL 0.05 % NA SOLN
1.0000 | Freq: Once | NASAL | Status: AC
  Administered 2019-12-09: 20:00:00 1 via NASAL
  Filled 2019-12-09: qty 30

## 2019-12-09 NOTE — ED Provider Notes (Signed)
[facility] EMERGENCY DEPARTMENT Provider Note   CSN: [id number] Arrival date & time: 12/09/19  1750     History Chief Complaint  Patient presents with  . Epistaxis    Breanna Kirby is a 64 y.o. female.  HPI  Patient is a 64 year old female who is not on blood thinners with no history of sinus surgeries, history of one episode of nosebleed that required cautery in the past with past medical history of lymphoma which is being closely surveilled for has had normal blood counts in the past.  Patient states for the past week she has had a bit of a runny nose.  She states that this morning after blowing her nose up she started having a nosebleed.  She states that she was able to stop the nosebleed several times but it has been intermittently on and off bleeding since 9:30 AM this morning.  Patient states that she was able to stop a nosebleed while in route to ED however states that she sniffed hard while on the way and felt that the bleeding started again.  She denies any lightheadedness, dizziness, headache, shortness of breath, chest pain or weakness.      Past Medical History:  Diagnosis Date  . Lymphoma [facility])     Patient Active Problem List   Diagnosis Date Noted  . Acute deep vein thrombosis (DVT) of femoral vein ([place]) 12/19/2017  . Fatigue 12/03/2017  . Essential hypertension 03/14/2016  . Follicular lymphoma grade II of lymph nodes of multiple sites ([place]) 04/06/2015  . Vitiligo 01/22/2012    Past Surgical History:  Procedure Laterality Date  . CESAREAN SECTION       OB History   No obstetric history on file.     No family history on file.  Social History   Tobacco Use  . Smoking status: Never Smoker  . Smokeless tobacco: Never Used  Substance Use Topics  . Alcohol use: Yes  . Drug use: Never    Home Medications Prior to Admission medications   Medication Sig Start Date End Date Taking? Authorizing Provider  Multiple Vitamin  (MULTIVITAMIN WITH MINERALS) TABS tablet Take 1 tablet by mouth daily.   Yes [provider]  Omega-3 1000 MG CAPS Take 1 capsule by mouth daily.   Yes [provider]  PARoxetine (PAXIL) 10 MG tablet Take 10 mg by mouth daily.   Yes [provider]  VITAMIN D PO Take 1 tablet by mouth daily.   Yes [provider]  cyclobenzaprine (FLEXERIL) 5 MG tablet Take 1 tablet (5 mg total) by mouth 3 (three) times daily as needed for muscle spasms. Patient not taking: Reported on 09/02/2018 08/06/18   Tenna Delaine D, PA-C    Allergies    Patient has no known allergies.  Review of Systems   Review of Systems  Constitutional: Negative for chills, fatigue and fever.  HENT: Positive for nosebleeds and rhinorrhea.   Eyes: Negative for pain.  Respiratory: Negative for cough and shortness of breath.   Cardiovascular: Negative for chest pain and leg swelling.  Gastrointestinal: Negative for abdominal pain and vomiting.  Skin: Negative for rash.  Neurological: Negative for dizziness and headaches.    Physical Exam Updated Vital Signs BP 135/67 (BP Location: Right Arm)   Pulse 78   Temp 98.6 F (37 C) (Oral)   Resp 15   Ht 5\' 2"  (1.575 m)   Wt 51.7 kg   SpO2 100%   BMI 20.85 kg/m  Physical Exam Vitals and nursing note reviewed.  Constitutional:      General: She is not in acute distress.    Appearance: Normal appearance. She is not ill-appearing.     Comments: Pleasant, 64 year old female in no acute distress.  HENT:     Head: Normocephalic and atraumatic.     Nose: No congestion.     Comments: Copious blood clot in the left nare.  Not actively bleeding.    Mouth/Throat:     Mouth: Mucous membranes are moist.  Eyes:     General: No scleral icterus.       Right eye: No discharge.        Left eye: No discharge.     Conjunctiva/sclera: Conjunctivae normal.  Cardiovascular:     Rate and Rhythm: Normal rate.     Comments: Heart rate 86 on my  examination with palpation at radial pulse. Pulmonary:     Effort: Pulmonary effort is normal.     Breath sounds: No stridor.  Musculoskeletal:     Comments: Moves all 4 extremities.  Ambulatory without difficulty.  no gait abnormalities.  Skin:    General: Skin is warm and dry.     Capillary Refill: Capillary refill takes less than 2 seconds.     Coloration: Skin is not pale.  Neurological:     Mental Status: She is alert and oriented to person, place, and time. Mental status is at baseline.     ED Results / Procedures / Treatments   Labs (all labs ordered are listed, but only abnormal results are displayed) Labs Reviewed  HEMOGLOBIN AND HEMATOCRIT, BLOOD    EKG None  Radiology No results found.  Procedures Procedures (including critical care time)  Medications Ordered in ED Medications  oxymetazoline (AFRIN) 0.05 % nasal spray 1 spray (1 spray Each Nare Given 12/09/19 2029)    ED Course  I have reviewed the triage vital signs and the nursing notes.  Pertinent labs & imaging results that were available during my care of the patient were reviewed by me and considered in my medical decision making (see chart for details).    MDM Rules/Calculators/A&P                      Patient has been in emergency department for 2 hours and has had no recurrence of her nosebleed.  She is well-appearing in no acute distress and my exam has dried blood on her face however after cleaning her face of all blood and denies any active bleeding.  I used a Q-tip to remove some blood from her nose and inspected for evidence of a source of the likely anterior nosebleed.  There is none evident.  I discussed with the patient the pros and cons of further exploration including potential for restarting nosebleed.  Shared decision making conversation with patient she would prefer to be discharged home at this time.  I will provide patient with a bottle of Afrin that she can use if she begins to rebleed.   I instructed patient on how to use it and to return to emergency department if unable to control nosebleed.  I also provide patient with referral to ENT as she would like to be evaluated by them for possible cauterization.  Patient was initially tachycardic in triage while having a nosebleed however is normotensive and nontachycardic on my examination.  Her H&H is within normal limits.  We will discharge at this time.    Final  Clinical Impression(s) / ED Diagnoses Final diagnoses:  Left-sided epistaxis    Rx / DC Orders ED Discharge Orders    None       Tedd Sias, [place] 12/10/19 1318    Isla Pence, MD 12/11/19 2003

## 2019-12-09 NOTE — Discharge Instructions (Signed)
Please use Afrin as we discussed for nosebleeds if they occur again.  Please use the nose compression device as well.  Please refrain from using any aspirin or ibuprofen.  As this may decrease your ability to form clots

## 2019-12-09 NOTE — ED Triage Notes (Signed)
Intermittent nosebleed since 9:30am.  States she thought it had quit and it started back when she sniffed just PTA.  Bleeding controlled at this time.

## 2019-12-19 ENCOUNTER — Ambulatory Visit: Payer: No Typology Code available for payment source | Attending: Internal Medicine

## 2019-12-19 DIAGNOSIS — Z23 Encounter for immunization: Secondary | ICD-10-CM

## 2019-12-19 NOTE — Progress Notes (Signed)
   Covid-19 Vaccination Clinic  Name:  Breanna Kirby    MRN: [id number] DOB: 1956-01-03  12/19/2019  [name] was observed post Covid-19 immunization for 15 minutes without incident. She was provided with Vaccine Information Sheet and instruction to access the V-Safe system.   [name] was instructed to call 911 with any severe reactions post vaccine: Marland Kitchen Difficulty breathing  . Swelling of face and throat  . A fast heartbeat  . A bad rash all over body  . Dizziness and weakness   Immunizations Administered    Name Date Dose VIS Date Route   Pfizer COVID-19 Vaccine 12/19/2019 12:51 PM 0.3 mL 09/04/2019 Intramuscular   Manufacturer: [place]   Lot: [id number]   [place]: [id number]

## 2019-12-29 ENCOUNTER — Ambulatory Visit: Payer: No Typology Code available for payment source

## 2020-05-05 ENCOUNTER — Other Ambulatory Visit: Payer: Self-pay | Admitting: Family Medicine

## 2020-05-05 DIAGNOSIS — Z1231 Encounter for screening mammogram for malignant neoplasm of breast: Secondary | ICD-10-CM

## 2020-05-20 ENCOUNTER — Other Ambulatory Visit: Payer: Self-pay

## 2020-05-20 ENCOUNTER — Ambulatory Visit
Admission: RE | Admit: 2020-05-20 | Discharge: 2020-05-20 | Disposition: A | Discharge status: Home / Self Care | Payer: No Typology Code available for payment source | Source: Ambulatory Visit | Attending: Family Medicine | Admitting: Family Medicine

## 2020-05-20 DIAGNOSIS — Z1231 Encounter for screening mammogram for malignant neoplasm of breast: Secondary | ICD-10-CM

## 2020-05-24 ENCOUNTER — Ambulatory Visit: Payer: Self-pay | Attending: Internal Medicine

## 2020-05-24 DIAGNOSIS — Z23 Encounter for immunization: Secondary | ICD-10-CM

## 2020-05-24 NOTE — Progress Notes (Signed)
   Covid-19 Vaccination Clinic  Name:  Breanna Kirby    MRN: [id number] DOB: [birth year]  05/24/2020  [name] was observed post Covid-19 immunization for 15 minutes without incident. She was provided with Vaccine Information Sheet and instruction to access the V-Safe system.   [name] was instructed to call 911 with any severe reactions post vaccine: Marland Kitchen Difficulty breathing  . Swelling of face and throat  . A fast heartbeat  . A bad rash all over body  . Dizziness and weakness

## 2021-01-16 ENCOUNTER — Ambulatory Visit: Payer: No Typology Code available for payment source

## 2021-01-23 ENCOUNTER — Other Ambulatory Visit (HOSPITAL_BASED_OUTPATIENT_CLINIC_OR_DEPARTMENT_OTHER): Payer: Self-pay

## 2021-01-23 ENCOUNTER — Ambulatory Visit: Payer: No Typology Code available for payment source | Attending: Internal Medicine

## 2021-01-23 ENCOUNTER — Other Ambulatory Visit: Payer: Self-pay

## 2021-01-23 DIAGNOSIS — Z23 Encounter for immunization: Secondary | ICD-10-CM

## 2021-01-23 MED ORDER — COVID-19 MRNA VACC (MODERNA) 100 MCG/0.5ML IM SUSP
INTRAMUSCULAR | 0 refills | Status: AC
  Filled 2021-01-23: qty 0.25, 1d supply, fill #0

## 2021-01-23 NOTE — Progress Notes (Signed)
   Covid-19 Vaccination Clinic  Name:  Breanna Kirby    MRN: [id number] DOB: 1956-08-28  01/23/2021  [name] was observed post Covid-19 immunization for 15 minutes without incident. She was provided with Vaccine Information Sheet and instruction to access the V-Safe system.   [name] was instructed to call 911 with any severe reactions post vaccine: Marland Kitchen Difficulty breathing  . Swelling of face and throat  . A fast heartbeat  . A bad rash all over body  . Dizziness and weakness   Immunizations Administered    Name Date Dose VIS Date Route   Moderna Covid-19 Booster Vaccine 01/23/2021 12:56 PM 0.25 mL 07/13/2020 Intramuscular   Manufacturer: Moderna   Lot: [id number]   [place]: [id number]

## 2021-04-05 ENCOUNTER — Other Ambulatory Visit: Payer: Self-pay | Admitting: Family Medicine

## 2021-04-05 DIAGNOSIS — Z1231 Encounter for screening mammogram for malignant neoplasm of breast: Secondary | ICD-10-CM

## 2021-04-05 DIAGNOSIS — M810 Age-related osteoporosis without current pathological fracture: Secondary | ICD-10-CM

## 2021-05-08 NOTE — Progress Notes (Signed)
Subjective:    CC: Bilat hand pain  I, Wendy Poet, LAT, ATC, am serving as scribe for Dr. [name].  HPI: Pt is a 65 y/o female presenting w/ bilat hand pain, L>R, ongoing for awhile.  Pt is R-hand dominate. She locates her pain to along the [facility] of B thumbs. Pt c/o increased pain when trying to do yoga and bearing weight through hands. Pt was seen by a hand specialist previously in [place]. Pt notes a hx of osteoporosis.  Grip strength: slightly weakened. Aggravating factors: yoga, gripping Treatments tried: braces at night   Pertinent review of Systems: No fevers or chills  Relevant historical information: History of follicular lymphoma about 8 years ago currently in remission.   Objective:    Vitals:   05/09/21 1050  BP: (!) 148/92  Pulse: 86  SpO2: 98%   General: Well Developed, well nourished, and in no acute distress.   MSK: Left hand bossing and swelling at first [facility].  Tender palpation in this region normal thumb motion.  Normal wrist motion.  Right hand positive swelling at first [facility].  Tender palpation in this region.  Normal wrist and hand motion.  Normal grip strength.  Pulses cap refill and sensation are intact distally bilateral hands.  Lab and Radiology Results  Diagnostic Limited MSK Ultrasound of: Bilateral first [place] and dorsal wrist Left: First [facility] significant degeneration with effusion. First dorsal wrist compartment no tenosynovitis present. Right first CMC mild to moderate degeneration with mild effusion. No evidence of de Quervain tenosynovitis. 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  X-ray images bilateral hands obtained today personally and independently interpreted  Left: Moderate to severe first CMC DJD.  No acute fractures visible.  Right: Mild first CMC DJD with some subluxation.  No acute fractures.  Await formal radiology review   Impression and Recommendations:    Assessment and Plan: 65 y.o. female  with bilateral hand pain left worse than right.  Patient has degeneration of the first [facility] bilaterally which is the primary source of pain.  Discussed conservative management strategies.  Plan for home exercise program, Voltaren gel, compression sleeve, and referral to hand PT.  Recheck in about 2 months.  If not improved certainly could proceed with steroid injection either sooner or at that follow-up visit.  Additionally ultimately if needed surgery is an option.Marland Kitchen  PDMP not reviewed this encounter. Orders Placed This Encounter  Procedures   [place] LIMITED JOINT SPACE STRUCTURES UP BILAT(NO LINKED CHARGES)    Standing Status:   Future    Number of Occurrences:   1    Standing Expiration Date:   11/09/2021    Order Specific Question:   Reason for Exam (SYMPTOM  OR DIAGNOSIS REQUIRED)    Answer:   bilateral hand pain    Order Specific Question:   Preferred imaging location?    Answer:   [place]   DG Hand Complete Right    Standing Status:   Future    Number of Occurrences:   1    Standing Expiration Date:   05/09/2022    Order Specific Question:   Reason for Exam (SYMPTOM  OR DIAGNOSIS REQUIRED)    Answer:   eval pain base of thumb    Order Specific Question:   Preferred imaging location?    Answer:   [facility]   DG Hand Complete Left    Standing Status:   Future    Number of  Occurrences:   1    Standing Expiration Date:   05/09/2022    Order Specific Question:   Reason for Exam (SYMPTOM  OR DIAGNOSIS REQUIRED)    Answer:   eval cmc djd    Order Specific Question:   Preferred imaging location?    Answer:   [facility]   Ambulatory referral to Physical Therapy    Referral Priority:   Routine    Referral Type:   Physical Medicine    Referral Reason:   Specialty Services Required    Requested Specialty:   Physical Therapy    Number of Visits Requested:   1   No orders of the defined types were placed in this encounter.   Discussed warning  signs or symptoms. Please see discharge instructions. Patient expresses understanding.   The above documentation has been reviewed and is accurate and complete [name], M.D.

## 2021-05-09 ENCOUNTER — Ambulatory Visit: Payer: Self-pay

## 2021-05-09 ENCOUNTER — Other Ambulatory Visit: Payer: Self-pay

## 2021-05-09 ENCOUNTER — Ambulatory Visit (INDEPENDENT_AMBULATORY_CARE_PROVIDER_SITE_OTHER): Payer: No Typology Code available for payment source | Admitting: Family Medicine

## 2021-05-09 ENCOUNTER — Ambulatory Visit (INDEPENDENT_AMBULATORY_CARE_PROVIDER_SITE_OTHER): Payer: No Typology Code available for payment source

## 2021-05-09 VITALS — BP 148/92 | HR 86 | Ht 62.0 in | Wt 116.0 lb

## 2021-05-09 DIAGNOSIS — M79641 Pain in right hand: Secondary | ICD-10-CM

## 2021-05-09 DIAGNOSIS — M79642 Pain in left hand: Secondary | ICD-10-CM | POA: Diagnosis not present

## 2021-05-09 NOTE — Patient Instructions (Signed)
Thank you for coming in today.   Please get an Xray today before you leave   I've referred you to Physical Therapy.  Let us know if you don't hear from them in one week.   Please use Voltaren gel (Generic Diclofenac Gel) up to 4x daily for pain as needed.  This is available over-the-counter as both the name brand Voltaren gel and the generic diclofenac gel.   Heat often helps.   Look for modification devices for your workout.   Recheck in about 2 months.   If this is not working or you have a problem please let me know. I can do more.

## 2021-05-10 NOTE — Progress Notes (Signed)
Left hand x-ray shows severe arthritis at the base of the thumb.

## 2021-05-10 NOTE — Progress Notes (Signed)
Right hand x-ray shows mild arthritis at the base of the thumb

## 2021-06-21 ENCOUNTER — Other Ambulatory Visit: Payer: Self-pay

## 2021-06-21 ENCOUNTER — Ambulatory Visit
Admission: RE | Admit: 2021-06-21 | Discharge: 2021-06-21 | Disposition: A | Discharge status: Home / Self Care | Payer: No Typology Code available for payment source | Source: Ambulatory Visit | Attending: Family Medicine | Admitting: Family Medicine

## 2021-06-21 DIAGNOSIS — Z1231 Encounter for screening mammogram for malignant neoplasm of breast: Secondary | ICD-10-CM

## 2021-06-28 ENCOUNTER — Other Ambulatory Visit (HOSPITAL_BASED_OUTPATIENT_CLINIC_OR_DEPARTMENT_OTHER): Payer: Self-pay

## 2021-06-28 MED ORDER — INFLUENZA VAC SPLIT QUAD 0.5 ML IM SUSY
PREFILLED_SYRINGE | INTRAMUSCULAR | 0 refills | Status: AC
  Filled 2021-06-28 (×2): qty 0.5, 1d supply, fill #0

## 2021-07-12 NOTE — Progress Notes (Signed)
I, Breanna Kirby, LAT, ATC, am serving as scribe for Dr. Lynne Kirby.  Breanna Kirby is a 65 y.o. female who presents to [place] [facility] today for f/u of B thumb/hand pain due to 1st [place] DJD.  She was last seen by Dr. Georgina Snell on 05/09/21 and was referred to hand therapy at [facility].  She was also advised to use Voltaren gel and a wrist/hand compression sleeve.  Today, pt reports that her B thumb pain has improved and rates her improvement at 60%.  She has completed hand therapy and has been d/c from PT.  She was provided w/ a HEP, focusing on grip strengthening.  She has also been using Voltaren gel.  Diagnostic testing: R and L hand XR- 05/09/21  Pertinent review of systems: No fevers or chills  Relevant historical information: Hypertension.  History of DVT.  History of follicular lymphoma   Exam:  BP 110/78 (BP Location: Left Arm, Patient Position: Sitting, Cuff Size: Normal)   Pulse 83   Ht 5\' 2"  (1.575 m)   Wt 113 lb 3.2 oz (51.3 kg)   SpO2 98%   BMI 20.70 kg/m  General: Well Developed, well nourished, and in no acute distress.   MSK: Bossing first [facility] bilateral hands. Normal thumb motion.  Intact grip.    Lab and Radiology Results  EXAM: LEFT HAND - COMPLETE 3+ VIEW   COMPARISON:  None.   FINDINGS: There is no evidence of fracture or dislocation. Severe degenerative changes of the first carpometacarpal joint. Soft tissues are unremarkable.   IMPRESSION: 1. Severe degenerative changes of the first carpometacarpal joint. 2.  No acute displaced fracture or dislocation.     Electronically Signed   By: Iven Finn M.D.   On: 05/09/2021 21:35    EXAM: RIGHT HAND - COMPLETE 3+ VIEW   COMPARISON:  None.   FINDINGS: Mild degenerative changes are noted in the first interphalangeal joint as well as distal interphalangeal joints of the second and fifth digits. No acute fracture or dislocation is noted. No soft tissue abnormality  is seen.   IMPRESSION: Mild degenerative change without acute abnormality.     Electronically Signed   By: Inez Catalina M.D.   On: 05/09/2021 22:06  I, Breanna Kirby, personally (independently) visualized and performed the interpretation of the images attached in this note.   Assessment and Plan: 65 y.o. female with bilateral thumb pain due to DJD at first [facility] left worse than right.  Fortunately good response to hand PT.  Patient is satisfied with her current progress and outcome.  Plan to continue home exercise program and conservative measures strategies including Voltaren gel.  Certainly intra-articular steroid injection is an obvious next step.  Ultimately if needed surgery is an option as well.  Reviewed treatment plan and options going forward.  Check back as needed. Total encounter time 20 minutes including face-to-face time with the patient and, reviewing past medical record, and charting on the date of service.      PDMP not reviewed this encounter. Orders Placed This Encounter  Procedures   [place] LIMITED JOINT SPACE STRUCTURES UP BILAT(NO LINKED CHARGES)    Order Specific Question:   Reason for Exam (SYMPTOM  OR DIAGNOSIS REQUIRED)    Answer:   B thumb pain    Order Specific Question:   Preferred imaging location?    Answer:   [place]   No orders of the defined types were placed in  this encounter.    Discussed warning signs or symptoms. Please see discharge instructions. Patient expresses understanding.   The above documentation has been reviewed and is accurate and complete Breanna Kirby, M.D.

## 2021-07-13 ENCOUNTER — Encounter: Payer: Self-pay | Admitting: Family Medicine

## 2021-07-13 ENCOUNTER — Ambulatory Visit (INDEPENDENT_AMBULATORY_CARE_PROVIDER_SITE_OTHER): Payer: No Typology Code available for payment source | Admitting: Family Medicine

## 2021-07-13 ENCOUNTER — Other Ambulatory Visit: Payer: Self-pay

## 2021-07-13 ENCOUNTER — Ambulatory Visit: Payer: Self-pay

## 2021-07-13 VITALS — BP 110/78 | HR 83 | Ht 62.0 in | Wt 113.2 lb

## 2021-07-13 DIAGNOSIS — M79645 Pain in left finger(s): Secondary | ICD-10-CM

## 2021-07-13 DIAGNOSIS — M79644 Pain in right finger(s): Secondary | ICD-10-CM

## 2021-07-13 NOTE — Patient Instructions (Addendum)
Nice to see you today.  Con't your home exercise program.  Follow-up as needed.

## 2021-10-19 ENCOUNTER — Other Ambulatory Visit: Payer: No Typology Code available for payment source

## 2021-11-16 ENCOUNTER — Other Ambulatory Visit: Payer: Self-pay | Admitting: Family Medicine

## 2021-11-16 DIAGNOSIS — M810 Age-related osteoporosis without current pathological fracture: Secondary | ICD-10-CM

## 2022-04-13 ENCOUNTER — Other Ambulatory Visit: Payer: No Typology Code available for payment source

## 2022-05-21 ENCOUNTER — Other Ambulatory Visit: Payer: Self-pay | Admitting: Family Medicine

## 2022-05-21 DIAGNOSIS — Z1231 Encounter for screening mammogram for malignant neoplasm of breast: Secondary | ICD-10-CM

## 2022-06-27 ENCOUNTER — Ambulatory Visit
Admission: RE | Admit: 2022-06-27 | Discharge: 2022-06-27 | Disposition: A | Discharge status: Home / Self Care | Payer: No Typology Code available for payment source | Source: Ambulatory Visit | Attending: Family Medicine | Admitting: Family Medicine

## 2022-06-27 DIAGNOSIS — Z1231 Encounter for screening mammogram for malignant neoplasm of breast: Secondary | ICD-10-CM

## 2022-07-16 ENCOUNTER — Other Ambulatory Visit (HOSPITAL_BASED_OUTPATIENT_CLINIC_OR_DEPARTMENT_OTHER): Payer: Self-pay

## 2022-07-16 MED ORDER — INFLUENZA VAC SPLIT QUAD 0.5 ML IM SUSY
PREFILLED_SYRINGE | INTRAMUSCULAR | 0 refills | Status: AC
  Filled 2022-07-16: qty 0.5, 1d supply, fill #0

## 2022-08-27 ENCOUNTER — Other Ambulatory Visit (HOSPITAL_BASED_OUTPATIENT_CLINIC_OR_DEPARTMENT_OTHER): Payer: Self-pay

## 2022-09-20 ENCOUNTER — Ambulatory Visit
Admission: RE | Admit: 2022-09-20 | Discharge: 2022-09-20 | Disposition: A | Discharge status: Home / Self Care | Payer: No Typology Code available for payment source | Source: Ambulatory Visit | Attending: Family Medicine | Admitting: Family Medicine

## 2022-09-20 DIAGNOSIS — M810 Age-related osteoporosis without current pathological fracture: Secondary | ICD-10-CM

## 2022-09-21 ENCOUNTER — Encounter: Payer: Self-pay | Admitting: Family Medicine

## 2022-09-21 DIAGNOSIS — M810 Age-related osteoporosis without current pathological fracture: Secondary | ICD-10-CM

## 2022-11-19 ENCOUNTER — Ambulatory Visit (INDEPENDENT_AMBULATORY_CARE_PROVIDER_SITE_OTHER): Payer: No Typology Code available for payment source

## 2022-11-19 ENCOUNTER — Ambulatory Visit (INDEPENDENT_AMBULATORY_CARE_PROVIDER_SITE_OTHER): Payer: No Typology Code available for payment source | Admitting: Family Medicine

## 2022-11-19 VITALS — BP 122/74 | HR 83 | Ht 62.0 in | Wt 113.6 lb

## 2022-11-19 DIAGNOSIS — R0781 Pleurodynia: Secondary | ICD-10-CM

## 2022-11-19 DIAGNOSIS — M810 Age-related osteoporosis without current pathological fracture: Secondary | ICD-10-CM

## 2022-11-19 NOTE — Patient Instructions (Addendum)
Thank you for coming in today.   Please get an Xray today before you leave   Try wearing a rib binder  Ok to continue tylenol and even ibuprofen for pain.   Consider PT if this can't get better.   I am worried about osteoporosis.  I would like to get prolia authorized. It could help a lot and be easier to use.

## 2022-11-19 NOTE — Progress Notes (Signed)
I,Allanna Bresee,acting as a scribe for [name], MD.,have documented all relevant documentation on the behalf of [name], MD,as directed by  [name], MD while in the presence of [name], MD.  Breanna Kirby is a 67 y.o. female who presents to [place] at [facility] today for rib injury. Injured left-sided ribs while presing Tofu last Tuesday. Pain just under the left breast. Pt has dx of Osteoporosis, not currently on treatment. Hx of rib fracture. Has tried IBU with short-term relief. Some discomfort with laughing/coughing.   Additionally she has questions about osteoporosis.  She currently is using calcium and vitamin D and doing weight-based exercise and resistance training.  She did use Fosamax originally but did not tolerate it well.  Diagnostic Imaging:  2006 - XR ribs   Pertinent review of systems: no fever or chills  Relevant historical information: Follicular lymphoma.  Vitiligo.  Osteoporosis.   Exam:  BP 122/74   Pulse 83   Ht '5\' 2"'$  (1.575 m)   Wt 113 lb 9.6 oz (51.5 kg)   SpO2 95%   BMI 20.78 kg/m  General: Well Developed, well nourished, and in no acute distress.   MSK: Left anterior inferior rib tender to palpation.  Normal chest motion.  Normal upper extremity motion.    Lab and Radiology Results  X-ray images left rib and chest obtained today personally independent interpreted. No acute fractures are visible. Await formal radiology review.   EXAM: DUAL X-RAY ABSORPTIOMETRY (DXA) FOR BONE MINERAL DENSITY   IMPRESSION: Referring Physician:  Kathyrn Lass Your patient completed a bone mineral density test using GE Lunar iDXA system (analysis version: 16). Technologist: lmn   PATIENT: Name: Breanna Kirby, Breanna Kirby Patient ID: [id number] Birth Date: April 16, 1956 Height: 61.6 in. Sex: Female Measured: 09/20/2022 Weight: 112.8 lbs. Indications: Caucasian, Depression, Estrogen Deficient, Paxil,  Postmenopausal Fractures: None Treatments: Vitamin D (E933.5)   ASSESSMENT: The BMD measured at AP Spine L1-L4 is 0.850 g/cm2 with a T-score of -2.8. This patient is considered osteoporotic according to [facility]) criteria.   The quality of the exam is good. Site Region Measured Date Measured Age YA T-score BMD Significant CHANGE AP Spine L1-L4 09/20/2022 64.6 -2.8 0.850 g/cm2   AP Spine L1-L4 03/19/2019 61.1 -2.9 0.836 g/cm2   DualFemur Neck Right 09/20/2022 64.6 -2.6 0.672 g/cm2   DualFemur Neck Right 03/19/2019 61.1 -2.4 0.698 g/cm2   DualFemur Total Mean 09/20/2022 64.6 -2.7 0.665 g/cm2   DualFemur Total Mean 03/19/2019 61.1 -2.7 0.672 g/cm2   There has been no statistically significant change in bone mineral density compared to previous study of 03/19/2019.      Assessment and Plan: 67 y.o. female with left rib pain.  Pain occurred without impact or contact injury.  The pain also occurs in the setting of osteoporosis.  I do not see a rib fracture myself on x-ray but radiology overread is still pending.  Plan for rib binder and over-the-counter Tylenol and NSAIDs for pain control.  We talked about physical therapy and can add that if not improving.  We also talked about osteoporosis management.  She has maximized conservative management weightbearing exercise and vitamin D.  She is also tried alendronate in the past and did not tolerate it.  I think would be reasonable to move onto next steps which would be Prolia or Evenity.  I would like to use these medicines.  She like to think about it and will let me know.  She  would like to wait until Medicare (May of this year) to authorize and start these medicines but would like to learn more about them as well.   PDMP not reviewed this encounter. Orders Placed This Encounter  Procedures   DG Ribs Unilateral W/Chest Left    Standing Status:   Future    Number of  Occurrences:   1    Standing Expiration Date:   12/18/2022    Order Specific Question:   Reason for Exam (SYMPTOM  OR DIAGNOSIS REQUIRED)    Answer:   left sided rib pain    Order Specific Question:   Preferred imaging location?    Answer:   [facility]   No orders of the defined types were placed in this encounter.    Discussed warning signs or symptoms. Please see discharge instructions. Patient expresses understanding.   The above documentation has been reviewed and is accurate and complete [name], M.D.

## 2022-11-21 ENCOUNTER — Encounter: Payer: Self-pay | Admitting: Family Medicine

## 2022-11-21 NOTE — Progress Notes (Signed)
No fractures seen on the rib x-ray

## 2023-02-06 DIAGNOSIS — C8218 Follicular lymphoma grade II, lymph nodes of multiple sites: Secondary | ICD-10-CM | POA: Diagnosis not present

## 2023-02-22 DIAGNOSIS — T148XXA Other injury of unspecified body region, initial encounter: Secondary | ICD-10-CM | POA: Diagnosis not present

## 2023-03-01 ENCOUNTER — Telehealth: Payer: Self-pay | Admitting: Family Medicine

## 2023-03-01 DIAGNOSIS — M79641 Pain in right hand: Secondary | ICD-10-CM

## 2023-03-01 NOTE — Telephone Encounter (Signed)
Patient called asking if Dr Denyse Amass would be able to put in a referral for her to see a Rheumatologist? She said that they had discussed her osteoporosis and osteoarthritis. She would like to see Rheumatology for these issues.  Please advise.

## 2023-03-01 NOTE — Telephone Encounter (Signed)
Per visit note 11/19/22:  Assessment and Plan: 67 y.o. female with left rib pain.  Pain occurred without impact or contact injury.  The pain also occurs in the setting of osteoporosis.  I do not see a rib fracture myself on x-ray but radiology overread is still pending.  Plan for rib binder and over-the-counter Tylenol and NSAIDs for pain control.  We talked about physical therapy and can add that if not improving.   We also talked about osteoporosis management.  She has maximized conservative management weightbearing exercise and vitamin D.  She is also tried alendronate in the past and did not tolerate it.  I think would be reasonable to move onto next steps which would be Prolia or Evenity.  I would like to use these medicines.  She like to think about it and will let me know.  She would like to wait until Medicare (May of this year) to authorize and start these medicines but would like to learn more about them as well.

## 2023-03-01 NOTE — Telephone Encounter (Signed)
Dr. Denyse Amass, OK to refer to rheumatology for management of OA and OP?

## 2023-03-01 NOTE — Telephone Encounter (Signed)
I called Breanna Kirby.  Rheum is not the right referral for hand OA or Osteoporosis.  We had a longer conversation.  Plan for OT referral for hand pain and OA.   Discussed osteoporosis. She would like to think about medicines for now.  She will call me back in 4-6 weeks.

## 2023-03-04 NOTE — Therapy (Signed)
OUTPATIENT OCCUPATIONAL THERAPY ORTHO EVALUATION  Patient Name: Breanna Kirby MRN: [id number] DOB:07/24/1956, 67 y.o., female Today's Date: 03/05/2023  PCP: Sigmund Hazel, MD REFERRING PROVIDER: Rodolph Bong, MD   END OF SESSION:  OT End of Session - 03/05/23 0849     Visit Number 1    Number of Visits 8    Date for OT Re-Evaluation 05/03/23    Authorization Type BCBS    OT Start Time 0849    OT Stop Time 0935    OT Time Calculation (min) 46 min    Activity Tolerance Patient tolerated treatment well;No increased pain;Patient limited by fatigue;Patient limited by pain    Behavior During Therapy [facility] for tasks assessed/performed             Past Medical History:  Diagnosis Date   Lymphoma [facility])    Past Surgical History:  Procedure Laterality Date   CESAREAN SECTION     Patient Active Problem List   Diagnosis Date Noted   Osteoporosis 11/19/2022   Acute deep vein thrombosis (DVT) of femoral vein (HCC) 12/19/2017   Fatigue 12/03/2017   Essential hypertension 03/14/2016   Follicular lymphoma grade II of lymph nodes of multiple sites (HCC) 04/06/2015   Vitiligo 01/22/2012    ONSET DATE: chronic onset- years   REFERRING DIAG: M79.641,M79.642 (ICD-10-CM) - Bilateral hand pain   THERAPY DIAG:  Pain in left hand  Pain in right hand  Localized edema  Muscle weakness (generalized)  Other lack of coordination  Stiffness of left wrist, not elsewhere classified  Rationale for Evaluation and Treatment: Rehabilitation  SUBJECTIVE:   SUBJECTIVE STATEMENT: She states she's been dealing with bil hand OA/pain for a long time, and it's worst when she is working out with free weights. She has hx of osteoporosis.  When doing yoga, she's had some basal joint thumb pain. Holding resistance bands is painful to her. She also c/o of OA in shoulders/neck. Lt RF is very swollen today but not painful. 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     PERTINENT HISTORY:  Osteoporosis, rib pain and binder, has lymphoma   PRECAUTIONS: Other: Lymphoma but not currently under treatment, osteoporosis ; WEIGHT BEARING RESTRICTIONS: No  PAIN:  Are you having pain? No resting pain, just stiffness.   FALLS: Has patient fallen in last 6 months? No  PLOF: Independent  PATIENT GOALS: To learn long-term management strategies for bilateral thumb and hand arthritis as well as management of shoulder arthritis and to learn exercises that would be safe for total arm strength in the presence of osteoporosis.   OBJECTIVE: (All objective assessments below are from initial evaluation on: 03/05/23 unless otherwise specified.)   HAND DOMINANCE: Right   ADLs: Overall ADLs: States decreased ability to grab, hold household objects, pain and inability to open containers, perform FMS tasks (manipulate fasteners on clothing), mild to moderate bathing problems as well.    FUNCTIONAL OUTCOME MEASURES: Eval: Quck DASH 31% impairment today  (Higher % Score  =  More Impairment)    UPPER EXTREMITY ROM     Shoulder to Wrist AROM Right eval Left eval  Shoulder flexion TBD   Shoulder abduction    Shoulder extension    Shoulder internal rotation TBD   Shoulder external rotation TBD   Elbow flexion    Elbow extension    Forearm supination    Forearm pronation     Wrist flexion 70 62  Wrist extension 64 58  (Blank rows = not  tested)   Hand AROM Right eval Left eval  Full Fist Ability (or Gap to Distal Palmar Crease) full full  Thumb Opposition  (Kapandji Scale)  10 10  Thumb MCP (0-60) 46 33  Thumb IP (0-80) 61 60  Thumb Radial Abduction Span 6.3cm  5.9cm   Thumb Palmar Abduction Span 6.9cm 6.4cm   (Blank rows = not tested)   UPPER EXTREMITY MMT:    Eval: Additional to be tested as needed, but grossly has good strength in bil UEs.   MMT Right 03/05/23 Left 03/05/23  1st dorsal interossei  5/5 5/5  Wrist flexion    Wrist extension    Wrist ulnar deviation     Wrist radial deviation    (Blank rows = not tested)  HAND FUNCTION: Eval: Observed weakness in affected hands L > R.  Grip strength Right: 36 lbs, Left: 29 lbs  3 pt pinch: Rt: 9#, Lt: 7#  COORDINATION: Eval: Possibly mild coordination impairments with bil affected hands. Details TBD 9 Hole Peg Test Right: TBDsec, Left: TBD sec   SENSATION: Eval:  Light touch intact today  OBSERVATIONS:   Eval: She has a lot of swelling in the left ring finger PIP joint today but it is not tender to palpation.  Bilateral thumb CMC joints are swollen and slightly tender to palpation and she has overt collapse deformity of both thumbs with the MCP joints caving inwards towards the palms-the left hand is worse in this regard.  Other various Bouchard's and Heberden's nodes noted in IP joints as well.   TODAY'S TREATMENT:  Post-evaluation treatment:  Today she was given initial self-care education to try to prevent strong thumb extension with tasks to help prevent collapse deformity from worsening.  Additionally she was recommended for osteoporosis to lift a weight that fatigues her after 8-12 reps versus doing high repetition exercises.  This encourages slightly higher weight to help build bone density and also to prevent high rep, irritating to arthritis activities.  Additionally she was given a printed form with "do's and do not's" for activities concerning thumb arthritis which was briefly reviewed with her.  She was educated to put loops in therapy bands to assist with holding them  Lastly she was given the following home exercise program, but it was not reviewed today other than 2 wrist stretches due to time of evaluation.  This will be reviewed in future sessions.  Exercises - Seated Wrist Flexion Stretch  - 3 x daily - 3 reps - 15 hold - Wrist Prayer Stretch  - 3 x daily - 3 reps - 15 sec hold - Stretch Thumb DOWNWARD  - 3 x daily - 3 reps - 15 sec hold - Thumb Webspace Stretch  - 3 x daily - 3  reps - 15 sec hold - Towel Roll Grip with Forearm in Neutral  - 3 x daily - 5 reps - 10 sec hold - Spread Index Finger Apart  - 3 x daily - 5 reps - 5 sec hold - C-Strength (try using rubber band)   - 3 x daily - 5 reps - 5 sec hold    PATIENT EDUCATION: Education details: See tx section above for details  Person educated: Patient Education method: Verbal Instruction, Teach back, Handouts  Education comprehension: States and demonstrates understanding, Additional Education required    HOME EXERCISE PROGRAM: Access Code: [email or web address] URL: [email or web address] Date: 03/05/2023 Prepared by: [name]   GOALS: Goals reviewed with patient? Yes   SHORT TERM  GOALS: (STG required if POC>30 days) Target Date: 03/22/23  Pt will obtain protective, custom orthotic. Goal status: TBD/PRN  2.  Pt will demo/state understanding of initial HEP to improve pain levels and prerequisite motion. Goal status: INITIAL   LONG TERM GOALS: Target Date: 05/03/23  Pt will improve functional ability by decreased impairment per Quick DASH assessment from 31% to 15% or better, for better quality of life. Goal status: INITIAL  2.  Pt will improve grip strength in Lt hand from 29lbs to at least 35lbs for functional use at home and in IADLs. Goal status: INITIAL  3.  Pt will improve A/ROM in Lt thumb MCP J flexion from 33* to at least 45*, to have functional motion for tasks like grasp & hold.  Goal status: INITIAL  4.  Pt will learn strengthening techniques for long-term management of ability and a eating bone density.  Goal status: INITIAL  5.  Pt will improve coordination skills in bil hands, as seen by better score on 9HPT testing to have increased functional ability to carry out fine motor tasks (fasteners, etc.) and more complex, coordinated IADLs (meal prep, sports, etc.).  Goal status: INITIAL- TBD baseline testing next visit  6.  Pt will decrease pain at worst while doing  yoga, opening containers, etc. to "[place] to no pain" vs mild-moderate to have better occupational participation in self-care roles. Goal status: INITIAL   ASSESSMENT:  CLINICAL IMPRESSION: Patient is a 67 y.o. female who was seen today for occupational therapy evaluation for bilateral hand and thumb arthritis as well as some complaints of shoulder stiffness and questions about management of osteoporosis, etc.  She has forming and worsening bilateral thumb collapse deformities which need addressed, and possibly with orthotic management as well.  She will benefit from outpatient occupational therapy to increase her knowledge and prerequisite functional ability to increase quality of life.  PERFORMANCE DEFICITS: in functional skills including ADLs, IADLs, coordination, dexterity, ROM, strength, pain, fascial restrictions, flexibility, body mechanics, endurance, decreased knowledge of precautions, and UE functional use, cognitive skills including problem solving, and psychosocial skills including coping strategies, environmental adaptation, habits, and routines and behaviors.   IMPAIRMENTS: are limiting patient from ADLs, IADLs, work, and leisure.   COMORBIDITIES: may have co-morbidities  that affects occupational performance. Patient will benefit from skilled OT to address above impairments and improve overall function.  MODIFICATION OR ASSISTANCE TO COMPLETE EVALUATION: No modification of tasks or assist necessary to complete an evaluation.  OT OCCUPATIONAL PROFILE AND HISTORY: Detailed assessment: Review of records and additional review of physical, cognitive, psychosocial history related to current functional performance.  CLINICAL DECISION MAKING: LOW - limited treatment options, no task modification necessary  REHAB POTENTIAL: Excellent  EVALUATION COMPLEXITY: Low      PLAN:  OT FREQUENCY: 1-2x/week (like just 1xweek unless painful exacerbation)   OT DURATION: 8 weeks through  05/03/23 as needed (anticipate only 6 weeks, but she will be on vacation for 1-2 weeks and miss time in therapy)   PLANNED INTERVENTIONS: self care/ADL training, therapeutic exercise, therapeutic activity, neuromuscular re-education, manual therapy, passive range of motion, splinting, paraffin, fluidotherapy, compression bandaging, moist heat, cryotherapy, contrast bath, patient/family education, coping strategies training, DME and/or AE instructions, and Dry needling  RECOMMENDED OTHER SERVICES: none now  CONSULTED AND AGREED WITH PLAN OF CARE: Patient  PLAN FOR NEXT SESSION:   Educated on entire home exercise program, continue education on management of collapse deformity and compensation, consider custom orthotics to prevent collapse and support  unstable joints, had IP joint stretches as tolerated, eventually work towards total arm strengthening providing examples of weighted activities to assist with bone density   [name], OTR/L, CHT 03/05/2023, 10:03 AM

## 2023-03-05 ENCOUNTER — Encounter: Payer: Self-pay | Admitting: Rehabilitative and Restorative Service Providers"

## 2023-03-05 ENCOUNTER — Other Ambulatory Visit: Payer: Self-pay

## 2023-03-05 ENCOUNTER — Ambulatory Visit: Payer: Medicare Other | Admitting: Rehabilitative and Restorative Service Providers"

## 2023-03-05 DIAGNOSIS — R600 Localized edema: Secondary | ICD-10-CM | POA: Diagnosis not present

## 2023-03-05 DIAGNOSIS — M79641 Pain in right hand: Secondary | ICD-10-CM

## 2023-03-05 DIAGNOSIS — R278 Other lack of coordination: Secondary | ICD-10-CM

## 2023-03-05 DIAGNOSIS — M79642 Pain in left hand: Secondary | ICD-10-CM

## 2023-03-05 DIAGNOSIS — M25632 Stiffness of left wrist, not elsewhere classified: Secondary | ICD-10-CM

## 2023-03-05 DIAGNOSIS — M6281 Muscle weakness (generalized): Secondary | ICD-10-CM

## 2023-03-20 NOTE — Therapy (Signed)
OUTPATIENT OCCUPATIONAL THERAPY TREATMENT NOTE  Patient Name: Breanna Kirby MRN: [id number] DOB:May 17, 1956, 67 y.o., female Today's Date: 03/21/2023  PCP: Sigmund Hazel, MD REFERRING PROVIDER: Rodolph Bong, MD   END OF SESSION:  OT End of Session - 03/21/23 1017     Visit Number 2    Number of Visits 8    Date for OT Re-Evaluation 05/03/23    Authorization Type BCBS    OT Start Time 1017    OT Stop Time 1105    OT Time Calculation (min) 48 min    Equipment Utilized During Treatment [id number]    Activity Tolerance Patient tolerated treatment well;No increased pain    Behavior During Therapy WFL for tasks assessed/performed              Past Medical History:  Diagnosis Date   Lymphoma [facility]    Past Surgical History:  Procedure Laterality Date   CESAREAN SECTION     Patient Active Problem List   Diagnosis Date Noted   Osteoporosis 11/19/2022   Acute deep vein thrombosis (DVT) of femoral vein (HCC) 12/19/2017   Fatigue 12/03/2017   Essential hypertension 03/14/2016   Follicular lymphoma grade II of lymph nodes of multiple sites (HCC) 04/06/2015   Vitiligo 01/22/2012    ONSET DATE: chronic onset- years   REFERRING DIAG: M79.641,M79.642 (ICD-10-CM) - Bilateral hand pain   THERAPY DIAG:  Pain in left hand  Pain in right hand  Localized edema  Muscle weakness (generalized)  Other lack of coordination  Stiffness of left wrist, not elsewhere classified  Rationale for Evaluation and Treatment: Rehabilitation  PERTINENT HISTORY: Osteoporosis, rib pain and binder, has lymphoma  She states she's been dealing with bil hand OA/pain for a long time, and it's worst when she is working out with free weights. She has hx of osteoporosis.  When doing yoga, she's had some basal joint thumb pain. Holding resistance bands is painful to her. She also c/o of OA in shoulders/neck. Lt RF is very swollen today but not painful. 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      PRECAUTIONS: Other: Lymphoma but not currently under treatment, osteoporosis; WEIGHT BEARING RESTRICTIONS: No   SUBJECTIVE:   SUBJECTIVE STATEMENT: She was away for ~3 weeks, she return stating that she hasn't had much pain and has been working out, etc.    PAIN:  Are you having pain?  No significant resting pain today, just stiffness.   FALLS: Has patient fallen in last 6 months? No  PLOF: Independent  PATIENT GOALS: To learn long-term management strategies for bilateral thumb and hand arthritis as well as management of shoulder arthritis and to learn exercises that would be safe for total arm strength in the presence of osteoporosis.   OBJECTIVE: (All objective assessments below are from initial evaluation on: 03/05/23 unless otherwise specified.)   HAND DOMINANCE: Right   ADLs: Overall ADLs: States decreased ability to grab, hold household objects, pain and inability to open containers, perform FMS tasks (manipulate fasteners on clothing), mild to moderate bathing problems as well.    FUNCTIONAL OUTCOME MEASURES: Eval: Quck DASH 31% impairment today  (Higher % Score  =  More Impairment)    UPPER EXTREMITY ROM     Shoulder to Wrist AROM Right eval Left eval Rt / Lt  03/21/23  Shoulder flexion TBD    Shoulder abduction     Shoulder extension     Shoulder internal rotation TBD    Shoulder external rotation  TBD    Elbow flexion     Elbow extension     Forearm supination     Forearm pronation      Wrist flexion 70 62 61 / 71  Wrist extension 64 58 74 / 80  (Blank rows = not tested)   Hand AROM Right eval Left eval  Full Fist Ability (or Gap to Distal Palmar Crease) full full  Thumb Opposition  (Kapandji Scale)  10 10  Thumb MCP (0-60) 46 33  Thumb IP (0-80) 61 60  Thumb Radial Abduction Span 6.3cm  5.9cm   Thumb Palmar Abduction Span 6.9cm 6.4cm   (Blank rows = not tested)   UPPER EXTREMITY MMT:    Eval: Additional to be tested as needed, but grossly  has good strength in bil UEs.   MMT Right 03/05/23 Left 03/05/23  1st dorsal interossei  5/5 5/5  Wrist flexion    Wrist extension    Wrist ulnar deviation    Wrist radial deviation    (Blank rows = not tested)  HAND FUNCTION: Eval: Observed weakness in affected hands L > R.  Grip strength Right: 36 lbs, Left: 29 lbs  3 pt pinch: Rt: 9#, Lt: 7#  COORDINATION: Eval: Possibly mild coordination impairments with bil affected hands. Details TBD 9 Hole Peg Test Right: TBDsec, Left: TBD sec   SENSATION: Eval:  Light touch intact today  OBSERVATIONS:   Eval: She has a lot of swelling in the left ring finger PIP joint today but it is not tender to palpation.  Bilateral thumb CMC joints are swollen and slightly tender to palpation and she has overt collapse deformity of both thumbs with the MCP joints caving inwards towards the palms-the left hand is worse in this regard.  Other various Bouchard's and Heberden's nodes noted in IP joints as well.   TODAY'S TREATMENT:  03/21/23: She starts with new active range of motion measures which show significant improvement at the wrist since last seen.  OT does review recommendations for gently opening up the transverse arch of the palm and getting the thumb MCP joint more "open."  She asks several questions about yoga positions and process and she demonstrates and OT agrees that these are fine.  She brings her new lightly compressive and padded gloves and which seems to be helping her in the gym.  OT also educates on the use of Kinesiotape as a very light support and means for encouraging thumb MCP flexion versus hyperextension.  OT also reviews her wrist stretches and educates on the remaining portions of the "stable C program."  She tolerates these well including the strengthening portions which were nonpainful and she states feeling a beneficial squeeze in the flexor pollicis brevis as well as the first dorsal interossei muscles.  OT also educates on  shoulder and neck stretch (shoulder internal rotation behind her back with neck lateral flexion to the same side), which she states feels like a good stretch and helps manage some of her stiffness in her trunk.  She is asked to think about any remaining issues or management of arthritis questions for her next visit-next Friday.  Exercises - Seated Wrist Flexion Stretch  - 3 x daily - 3 reps - 15 hold - Wrist Prayer Stretch  - 3 x daily - 3 reps - 15 sec hold - Stretch Thumb DOWNWARD  - 3 x daily - 3 reps - 15 sec hold - Thumb Webspace Stretch  - 3 x daily - 3  reps - 15 sec hold - Towel Roll Grip with Forearm in Neutral  - 3 x daily - 5 reps - 10 sec hold - Spread Index Finger Apart  - 3 x daily - 5 reps - 5 sec hold - C-Strength (try using rubber band)   - 3 x daily - 5 reps - 5 sec hold -Neck/upper trap/shoulder IR stretch - 3 x daily, 3 reps, 15sec hold    PATIENT EDUCATION: Education details: See tx section above for details  Person educated: Patient Education method: Verbal Instruction, Teach back, Handouts  Education comprehension: States and demonstrates understanding, Additional Education required    HOME EXERCISE PROGRAM: Access Code: [email or web address] URL: [email or web address] Date: 03/05/2023 Prepared by: [name]   GOALS: Goals reviewed with patient? Yes   SHORT TERM GOALS: (STG required if POC>30 days) Target Date: 03/22/23  Pt will obtain protective, custom orthotic. Goal status: TBD/PRN  2.  Pt will demo/state understanding of initial HEP to improve pain levels and prerequisite motion. Goal status: 03/21/23: MET   LONG TERM GOALS: Target Date: 05/03/23  Pt will improve functional ability by decreased impairment per Quick DASH assessment from 31% to 15% or better, for better quality of life. Goal status: INITIAL  2.  Pt will improve grip strength in Lt hand from 29lbs to at least 35lbs for functional use at home and in IADLs. Goal status:  INITIAL  3.  Pt will improve A/ROM in Lt thumb MCP J flexion from 33* to at least 45*, to have functional motion for tasks like grasp & hold.  Goal status: INITIAL  4.  Pt will learn strengthening techniques for long-term management of ability and a eating bone density.  Goal status: INITIAL  5.  Pt will improve coordination skills in bil hands, as seen by better score on 9HPT testing to have increased functional ability to carry out fine motor tasks (fasteners, etc.) and more complex, coordinated IADLs (meal prep, sports, etc.).  Goal status: INITIAL- TBD baseline testing next visit  6.  Pt will decrease pain at worst while doing yoga, opening containers, etc. to "[place] to no pain" vs mild-moderate to have better occupational participation in self-care roles. Goal status: INITIAL   ASSESSMENT:  CLINICAL IMPRESSION: 03/21/23: She has been managing fairly well since last seen here almost 3 weeks ago.  Although only seen twice now she states learning a lot and thinking about arthritis management.  We will see her next Friday to determine if she has any more needs for therapy.  Eval: Patient is a 67 y.o. female who was seen today for occupational therapy evaluation for bilateral hand and thumb arthritis as well as some complaints of shoulder stiffness and questions about management of osteoporosis, etc.  She has forming and worsening bilateral thumb collapse deformities which need addressed, and possibly with orthotic management as well.  She will benefit from outpatient occupational therapy to increase her knowledge and prerequisite functional ability to increase quality of life.   PLAN:  OT FREQUENCY: 1-2x/week (like just 1xweek unless painful exacerbation)   OT DURATION: 8 weeks through 05/03/23 as needed (anticipate only 6 weeks, but she will be on vacation for 1-2 weeks and miss time in therapy)   PLANNED INTERVENTIONS: self care/ADL training, therapeutic exercise, therapeutic activity,  neuromuscular re-education, manual therapy, passive range of motion, splinting, paraffin, fluidotherapy, compression bandaging, moist heat, cryotherapy, contrast bath, patient/family education, coping strategies training, DME and/or AE instructions, and Dry needling  CONSULTED AND AGREED WITH PLAN OF  CARE: Patient  PLAN FOR NEXT SESSION:   Check understanding of HEP for wrist hand and thumb issues as well as management of neck and shoulder tightness.  How did she do with Kinesiotape and bracing, and if she having any additional issues.   [name], OTR/L, CHT 03/21/2023, 11:18 AM

## 2023-03-21 ENCOUNTER — Encounter: Payer: Self-pay | Admitting: Rehabilitative and Restorative Service Providers"

## 2023-03-21 ENCOUNTER — Ambulatory Visit: Payer: Medicare Other | Admitting: Rehabilitative and Restorative Service Providers"

## 2023-03-21 DIAGNOSIS — M79641 Pain in right hand: Secondary | ICD-10-CM | POA: Diagnosis not present

## 2023-03-21 DIAGNOSIS — M6281 Muscle weakness (generalized): Secondary | ICD-10-CM

## 2023-03-21 DIAGNOSIS — M79642 Pain in left hand: Secondary | ICD-10-CM | POA: Diagnosis not present

## 2023-03-21 DIAGNOSIS — R278 Other lack of coordination: Secondary | ICD-10-CM

## 2023-03-21 DIAGNOSIS — R600 Localized edema: Secondary | ICD-10-CM | POA: Diagnosis not present

## 2023-03-21 DIAGNOSIS — M25632 Stiffness of left wrist, not elsewhere classified: Secondary | ICD-10-CM

## 2023-03-22 NOTE — Therapy (Signed)
OUTPATIENT OCCUPATIONAL THERAPY TREATMENT & DISCHARGE NOTE  Patient Name: Breanna Kirby MRN: [id number] DOB:[birth year], 67 y.o., female Today's Date: 03/29/2023  PCP: Sigmund Hazel, MD REFERRING PROVIDER: Rodolph Bong, MD    OCCUPATIONAL THERAPY DISCHARGE SUMMARY  Visits from Start of Care: 3  Current functional level related to goals / functional outcomes: Pt has met all goals to satisfactory levels after just 3 visits and is pleased with outcomes.   Remaining deficits: Pt has no more significant functional deficits or pain.   Education / Equipment: Pt has all needed materials and education. Pt understands how to continue on with self-management. See tx notes for more details.   Patient agrees to discharge due to max benefits received from outpatient occupational therapy / hand therapy at this time.   Breanna Kirby, OTR/L, CHT 03/29/23   END OF SESSION:  OT End of Session - 03/29/23 1005     Visit Number 3    Number of Visits 8    Date for OT Re-Evaluation 05/03/23    Authorization Type BCBS    OT Start Time 1008    OT Stop Time 1038    OT Time Calculation (min) 30 min    Equipment Utilized During Treatment --    Activity Tolerance Patient tolerated treatment well;No increased pain    Behavior During Therapy WFL for tasks assessed/performed             Past Medical History:  Diagnosis Date   Lymphoma [facility])    Past Surgical History:  Procedure Laterality Date   CESAREAN SECTION     Patient Active Problem List   Diagnosis Date Noted   Osteoporosis 11/19/2022   Acute deep vein thrombosis (DVT) of femoral vein (HCC) 12/19/2017   Fatigue 12/03/2017   Essential hypertension 03/14/2016   Follicular lymphoma grade II of lymph nodes of multiple sites (HCC) 04/06/2015   Vitiligo 01/22/2012    ONSET DATE: chronic onset- years   REFERRING DIAG: M79.641,M79.642 (ICD-10-CM) - Bilateral hand pain   THERAPY DIAG:  Pain in left hand  Pain in right  hand  Localized edema  Stiffness of left wrist, not elsewhere classified  Muscle weakness (generalized)  Other lack of coordination  Rationale for Evaluation and Treatment: Rehabilitation  PERTINENT HISTORY: Osteoporosis, rib pain and binder, has lymphoma  She states she's been dealing with bil hand OA/pain for a long time, and it's worst when she is working out with free weights. She has hx of osteoporosis.  When doing yoga, she's had some basal joint thumb pain. Holding resistance bands is painful to her. She also c/o of OA in shoulders/neck. Lt RF is very swollen today but not painful. 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     PRECAUTIONS: Other: Lymphoma but not currently under treatment, osteoporosis; WEIGHT BEARING RESTRICTIONS: No   SUBJECTIVE:   SUBJECTIVE STATEMENT: She states following recommendations and having less pain and problems now.    PAIN:  Are you having pain?   No significant resting pain today, just stiffness.   PATIENT GOALS: To learn long-term management strategies for bilateral thumb and hand arthritis as well as management of shoulder arthritis and to learn exercises that would be safe for total arm strength in the presence of osteoporosis.   OBJECTIVE: (All objective assessments below are from initial evaluation on: 03/05/23 unless otherwise specified.)   HAND DOMINANCE: Right   ADLs: Overall ADLs:  03/29/23 States much less issues now, only mild at worst   FUNCTIONAL  OUTCOME MEASURES: 03/29/23: Quick DASH: 11% today   Eval: Quck DASH 31% impairment today  (Higher % Score  =  More Impairment)    UPPER EXTREMITY ROM     Shoulder to Wrist AROM Right eval Left eval Rt/ Lt  03/29/23  Shoulder flexion TBD    Shoulder abduction     Shoulder extension     Shoulder internal rotation TBD    Shoulder external rotation TBD    Elbow flexion     Elbow extension     Forearm supination     Forearm pronation      Wrist flexion 70 62 77 / 75   Wrist extension 64 58 74 / 72  (Blank rows = not tested)   Hand AROM Right eval Left eval Lt 03/29/23  Full Fist Ability (or Gap to Distal Palmar Crease) full full   Thumb Opposition  (Kapandji Scale)  10 10   Thumb MCP (0-60) 46 33 41  Thumb IP (0-80) 61 60   Thumb Radial Abduction Span 6.3cm  5.9cm    Thumb Palmar Abduction Span 6.9cm 6.4cm    (Blank rows = not tested)   UPPER EXTREMITY MMT:    Eval: Additional to be tested as needed, but grossly has good strength in bil UEs.   MMT Right 03/05/23 Left 03/05/23  1st dorsal interossei  5/5 5/5  Wrist flexion    Wrist extension    Wrist ulnar deviation    Wrist radial deviation    (Blank rows = not tested)  HAND FUNCTION: 03/29/23: Grip strength Right: 33 lbs, Left: 35 lbs  3 pt pinch: Rt: 8#, Lt: 10#   Eval: Observed weakness in affected hands L > R.  Grip strength Right: 36 lbs, Left: 29 lbs  3 pt pinch: Rt: 9#, Lt: 7#  COORDINATION: 03/29/23: no coordination issues/complaints   OBSERVATIONS:   Eval: She has a lot of swelling in the left ring finger PIP joint today but it is not tender to palpation.  Bilateral thumb CMC joints are swollen and slightly tender to palpation and she has overt collapse deformity of both thumbs with the MCP joints caving inwards towards the palms-the left hand is worse in this regard.  Other various Bouchard's and Heberden's nodes noted in IP joints as well.   TODAY'S TREATMENT:  03/29/23: Pt performs AROM, gripping, and strength with bil hands against therapist's resistance for exercise/activities as well as new measures today.  (See the above notes for details)  In the left hand which was more painful-now she has more grip strength, more pinch strength, more motion and better ability.  OT also discusses home and functional tasks with the pt and reviews recommendations for reducing stress and wear and tear to both thumbs and hand arthritis.  Together we review her goals for therapy and she states  being quite pleased with everything.  OT also carefully reviews all of her home exercises with her providing modifications as necessary-though she does quite well with the is and has [place] to no questions.     PATIENT EDUCATION: Education details: See tx section above for details  Person educated: Patient Education method: Verbal Instruction, Teach back, Handouts  Education comprehension: States and demonstrates understanding   HOME EXERCISE PROGRAM: Access Code: [email or web address] URL: [email or web address] Date: 03/05/2023 Prepared by: Breanna Kirby   GOALS: Goals reviewed with patient? Yes   SHORT TERM GOALS: (STG required if POC>30 days) Target Date: 03/22/23  Pt will obtain protective, custom orthotic. Goal status:  D/C  2.  Pt will demo/state understanding of initial HEP to improve pain levels and prerequisite motion. Goal status: 03/21/23: MET   LONG TERM GOALS: Target Date: 05/03/23  Pt will improve functional ability by decreased impairment per Quick DASH assessment from 31% to 15% or better, for better quality of life. Goal status: 03/29/23: MET 11% now  2.  Pt will improve grip strength in Lt hand from 29lbs to at least 35lbs for functional use at home and in IADLs. Goal status: 03/29/23: MET 35# today  3.  Pt will improve A/ROM in Lt thumb MCP J flexion from 33* to at least 45*, to have functional motion for tasks like grasp & hold.  Goal status: 03/29/23: improved to 41* now, D/C   4.  Pt will learn strengthening techniques for long-term management of ability and a increasing bone density.  Goal status: 03/29/23: MET  5.  Pt will improve coordination skills in bil hands, as seen by better score on 9HPT testing to have increased functional ability to carry out fine motor tasks (fasteners, etc.) and more complex, coordinated IADLs (meal prep, sports, etc.).  Goal status: 03/29/23: D/C goal- N/A   6.  Pt will decrease pain at worst while doing yoga, opening  containers, etc. to "[place] to no pain" vs mild-moderate to have better occupational participation in self-care roles. Goal status: 03/29/23 MET   ASSESSMENT:  CLINICAL IMPRESSION: 03/29/23: She met most of her goals today, has no pain at rest, has had much less problems at home and in the community now and states understanding her exercise program and OT recommendations.  She was discharged today happily   PLAN:  OT FREQUENCY & OT DURATION: D/C  PLANNED INTERVENTIONS: self care/ADL training, therapeutic exercise, therapeutic activity, neuromuscular re-education, manual therapy, passive range of motion, splinting, paraffin, fluidotherapy, compression bandaging, moist heat, cryotherapy, contrast bath, patient/family education, coping strategies training, DME and/or AE instructions, and Dry needling  CONSULTED AND AGREED WITH PLAN OF CARE: Patient  PLAN FOR NEXT SESSION:   D/C successfully   Breanna Kirby, OTR/L, CHT 03/29/2023, 10:43 AM

## 2023-03-29 ENCOUNTER — Encounter: Payer: Self-pay | Admitting: Rehabilitative and Restorative Service Providers"

## 2023-03-29 ENCOUNTER — Ambulatory Visit: Payer: Medicare Other | Admitting: Rehabilitative and Restorative Service Providers"

## 2023-03-29 DIAGNOSIS — M25632 Stiffness of left wrist, not elsewhere classified: Secondary | ICD-10-CM | POA: Diagnosis not present

## 2023-03-29 DIAGNOSIS — M79642 Pain in left hand: Secondary | ICD-10-CM | POA: Diagnosis not present

## 2023-03-29 DIAGNOSIS — R600 Localized edema: Secondary | ICD-10-CM | POA: Diagnosis not present

## 2023-03-29 DIAGNOSIS — R278 Other lack of coordination: Secondary | ICD-10-CM

## 2023-03-29 DIAGNOSIS — M79641 Pain in right hand: Secondary | ICD-10-CM

## 2023-03-29 DIAGNOSIS — M6281 Muscle weakness (generalized): Secondary | ICD-10-CM

## 2023-04-03 DIAGNOSIS — K080 Exfoliation of teeth due to systemic causes: Secondary | ICD-10-CM | POA: Diagnosis not present

## 2023-04-04 ENCOUNTER — Encounter: Payer: Medicare Other | Admitting: Rehabilitative and Restorative Service Providers"

## 2023-04-08 DIAGNOSIS — F419 Anxiety disorder, unspecified: Secondary | ICD-10-CM | POA: Diagnosis not present

## 2023-04-08 DIAGNOSIS — Z79899 Other long term (current) drug therapy: Secondary | ICD-10-CM | POA: Diagnosis not present

## 2023-05-01 DIAGNOSIS — H31002 Unspecified chorioretinal scars, left eye: Secondary | ICD-10-CM | POA: Diagnosis not present

## 2023-05-01 DIAGNOSIS — H2513 Age-related nuclear cataract, bilateral: Secondary | ICD-10-CM | POA: Diagnosis not present

## 2023-05-10 DIAGNOSIS — E7800 Pure hypercholesterolemia, unspecified: Secondary | ICD-10-CM | POA: Diagnosis not present

## 2023-05-10 DIAGNOSIS — M810 Age-related osteoporosis without current pathological fracture: Secondary | ICD-10-CM | POA: Diagnosis not present

## 2023-05-10 DIAGNOSIS — H6123 Impacted cerumen, bilateral: Secondary | ICD-10-CM | POA: Diagnosis not present

## 2023-05-10 DIAGNOSIS — Z0000 Encounter for general adult medical examination without abnormal findings: Secondary | ICD-10-CM | POA: Diagnosis not present

## 2023-05-22 ENCOUNTER — Encounter: Payer: Self-pay | Admitting: Family Medicine

## 2023-05-22 ENCOUNTER — Ambulatory Visit: Payer: Medicare Other | Admitting: Family Medicine

## 2023-05-22 VITALS — HR 92 | Ht 61.75 in | Wt 112.0 lb

## 2023-05-22 DIAGNOSIS — M810 Age-related osteoporosis without current pathological fracture: Secondary | ICD-10-CM

## 2023-05-22 NOTE — Patient Instructions (Signed)
Thank you for coming in today.   Think about bisphosphonate like Fosamax, or reclast.   Think about prolia and evenity.   Let me know.

## 2023-05-22 NOTE — Progress Notes (Signed)
   I, Stevenson Clinch, CMA acting as a scribe for Clementeen Graham, MD.  Breanna Kirby is a 67 y.o. female who presents to [facility] at [facility] today for osteoporosis management. Hx of rib fx.  Has been on Fosamax in the past, caused very bad GI upset/bloating.  Has been on antidepressants in the past which are known to cause skeletal harm. Discontinued these medications a few months ago. Was on these medications for about 10 years.  Calcium and Vitamin D through diet Plant based diet and fish. Taking 4,000 international units of Vitamin D.  No family hx of osteoporosis or osteoporotic fracture.  Denies long-term steroid use.    Dx testing: 09/20/22 DEXA scan - T-score -2.8  Pertinent review of systems: No fevers or chills  Relevant historical information: History of follicular lymphoma   Exam:  Pulse 92   Ht 5' 1.75" (1.568 m)   Wt 112 lb (50.8 kg)   SpO2 96%   BMI 20.65 kg/m  General: Well Developed, well nourished, and in no acute distress.   MSK: Normal lumbar motion.       Assessment and Plan: 68 y.o. female with osteoporosis.  T-score -2.8.  We spent time today discussing her various osteoporosis treatment options.  She is already maximizing conservative management with weightbearing exercise vitamin D and calcium.  We talked about bisphosphonates Prolia and Evenity.  I think Prolia is probably her best option but she is not sure.  She wants to think about it a [name] bit.  Will work on authorization for [employer] to give her an idea of cost.   PDMP not reviewed this encounter. No orders of the defined types were placed in this encounter.  No orders of the defined types were placed in this encounter.    Discussed warning signs or symptoms. Please see discharge instructions. Patient expresses understanding.   The above documentation has been reviewed and is accurate and complete Clementeen Graham, M.D. Total encounter time 20 minutes  including face-to-face time with the patient and, reviewing past medical record, and charting on the date of service.

## 2023-05-28 ENCOUNTER — Telehealth: Payer: Self-pay

## 2023-05-28 ENCOUNTER — Other Ambulatory Visit: Payer: Self-pay | Admitting: Family Medicine

## 2023-05-28 DIAGNOSIS — Z1231 Encounter for screening mammogram for malignant neoplasm of breast: Secondary | ICD-10-CM

## 2023-05-28 NOTE — Telephone Encounter (Signed)
Rodolph Bong, MD  Dierdre Searles, CMA Please check on cost for Prolia and Evenity.  She is not sure what she wants.

## 2023-05-28 NOTE — Telephone Encounter (Signed)
Prolia VOB initiated via [email or web address]  Next Prolia inj DUE: new start

## 2023-05-29 DIAGNOSIS — J342 Deviated nasal septum: Secondary | ICD-10-CM | POA: Diagnosis not present

## 2023-05-29 DIAGNOSIS — J309 Allergic rhinitis, unspecified: Secondary | ICD-10-CM | POA: Diagnosis not present

## 2023-05-30 NOTE — Telephone Encounter (Signed)
Pt ready for scheduling on or after 05/30/23  Out-of-pocket cost due at time of visit: $320  Primary: BCBS [place] [employer] H[employer]-POS Prolia co-insurance: 20% (approximately $320) Admin fee co-insurance: 0%   Deductible: does not apply  Prior Auth: NOT required  Secondary: N/A Prolia co-insurance:  Admin fee co-insurance:  Deductible:  Prior Auth:  PA# Valid:   ** This summary of benefits is an estimation of the patient's out-of-pocket cost. Exact cost may vary based on individual plan coverage.

## 2023-05-30 NOTE — Telephone Encounter (Signed)
NO Prior Auth required for [employer]

## 2023-05-31 NOTE — Telephone Encounter (Signed)
Spoke to her oncologist at [facility] and they have her referred her to an Endocrinologist to discuss further. She said that she has done a lot of research on Prolia and at this point she would like to hold off on this option.  She said that she will discuss this further with the Endocrinologist and keep Dr Denyse Amass in the loop on further decisions.

## 2023-06-11 ENCOUNTER — Encounter: Payer: Self-pay | Admitting: Family Medicine

## 2023-06-12 MED ORDER — PREDNISONE 50 MG PO TABS: 50.0000 mg | ORAL_TABLET | Freq: Every day | ORAL | 0 refills | Status: DC

## 2023-06-12 MED ORDER — GABAPENTIN 100 MG PO CAPS: 100.0000 mg | ORAL_CAPSULE | Freq: Three times a day (TID) | ORAL | 3 refills | Status: AC | PRN

## 2023-06-20 ENCOUNTER — Encounter: Payer: Self-pay | Admitting: Family Medicine

## 2023-06-21 ENCOUNTER — Other Ambulatory Visit: Payer: Self-pay

## 2023-06-21 DIAGNOSIS — G8929 Other chronic pain: Secondary | ICD-10-CM

## 2023-06-24 NOTE — Therapy (Signed)
OUTPATIENT PHYSICAL THERAPY THORACOLUMBAR EVALUATION   Patient Name: Breanna Kirby MRN: [id number] DOB:17-Mar-1956, 67 y.o., female Today's Date: 06/24/2023  END OF SESSION:   Past Medical History:  Diagnosis Date   Lymphoma [facility])    Past Surgical History:  Procedure Laterality Date   CESAREAN SECTION     Patient Active Problem List   Diagnosis Date Noted   Osteoporosis 11/19/2022   Acute deep vein thrombosis (DVT) of femoral vein (HCC) 12/19/2017   Fatigue 12/03/2017   Essential hypertension 03/14/2016   Follicular lymphoma grade II of lymph nodes of multiple sites (HCC) 04/06/2015   Vitiligo 01/22/2012    PCP: Sigmund Hazel, MD   REFERRING PROVIDER: Rodolph Bong, MD   REFERRING DIAG: M54.50,G89.29 (ICD-10-CM) - Chronic low back pain, unspecified back pain laterality, unspecified whether sciatica present   Rationale for Evaluation and Treatment: Rehabilitation  THERAPY DIAG:  No diagnosis found.  ONSET DATE: ***  SUBJECTIVE:                                                                                                                                                                                           SUBJECTIVE STATEMENT: ***  PERTINENT HISTORY:  Osteoporosis, lymphoma, chronic LBP, HTN, history DVT, history C-section  PAIN:  Are you having pain? {OPRCPAIN:27236}  PRECAUTIONS: {Therapy precautions:24002}  RED FLAGS: {PT Red Flags:29287}   WEIGHT BEARING RESTRICTIONS: {Yes ***/No:24003}  FALLS:  Has patient fallen in last 6 months? {fallsyesno:27318}  LIVING ENVIRONMENT: Lives with: {OPRC lives with:25569::"lives with their family"} Lives in: {Lives in:25570} Stairs: {opstairs:27293} Has following equipment at home: {Assistive devices:23999}  OCCUPATION: ***  PLOF: {PLOF:24004}  PATIENT GOALS: ***  NEXT MD VISIT: ***  OBJECTIVE:   DIAGNOSTIC FINDINGS:  ***  PATIENT SURVEYS:  {rehab surveys:24030}  COGNITION: Overall  cognitive status: {cognition:24006}     SENSATION: {sensation:27233}  MUSCLE LENGTH: Hamstrings: Right *** deg; Left *** deg Thomas test: Right *** deg; Left *** deg  POSTURE: {posture:25561}  PALPATION: ***  LUMBAR ROM:   AROM eval  Flexion   Extension   Right lateral flexion   Left lateral flexion   Right rotation   Left rotation    (Blank rows = not tested)  LOWER EXTREMITY ROM:      LOWER EXTREMITY MMT:    MMT Right eval Left eval  Hip flexion    Hip extension    Hip abduction    Hip adduction    Knee flexion    Knee extension    Ankle dorsiflexion    Ankle plantarflexion     (Blank rows = not tested)  LUMBAR SPECIAL TESTS:  {  lumbar special test:25242}  FUNCTIONAL TESTS:  {Functional tests:24029}  GAIT: Distance walked: *** Assistive device utilized: {Assistive devices:23999} Level of assistance: {Levels of assistance:24026} Comments: ***  TODAY'S TREATMENT:                                                                                                                              DATE: ***    PATIENT EDUCATION:  Education details: *** Person educated: {Person educated:25204} Education method: {Education Method:25205} Education comprehension: {Education Comprehension:25206}  HOME EXERCISE PROGRAM: ***  ASSESSMENT:  CLINICAL IMPRESSION: [name]  is a *** y.o. *** who was seen today for physical therapy evaluation and treatment for ***.    Patient presents with *** symptoms which are interfering with ADLs and are impacting quality of life.  On Modified Oswestry patient scored *** demonstrated *** disability.    ***  Patient also presents with impaired activity tolerance, impaired standing balance, impaired ambulation, and decreased safety awareness impacting safe and independent functional mobility. Examination also reveals patient is at risk for falls and functional decline as evidenced by the following objective test measures: Gait  speed *** m/sec, (67m/sec is needed for community access), mCTSIB: position 1: *** sec, position 2: ***sec, position 3: ***sec, position 4: ***sec (30sec in each position demonstrates equal weighting of balance systems), TUG of ***sec (>12sec indicates increased risk for falls), and 5x sit to stand of ***sec (>15sec indicates increased risk for falls and decreased BLE power). Patient will benefit from skilled physical therapy services to help reach the maximal level of functional independence and mobility. Patient demonstrates understanding of this plan of care and is in agreement with this plan.    OBJECTIVE IMPAIRMENTS: {opptimpairments:25111}.   ACTIVITY LIMITATIONS: {activitylimitations:27494}  PARTICIPATION LIMITATIONS: {participationrestrictions:25113}  PERSONAL FACTORS: {Personal factors:25162} are also affecting patient's functional outcome.   REHAB POTENTIAL: {rehabpotential:25112}  CLINICAL DECISION MAKING: {clinical decision making:25114}  EVALUATION COMPLEXITY: {Evaluation complexity:25115}   GOALS: Goals reviewed with patient? {yes/no:20286}  SHORT TERM GOALS: Target date: {follow up:25551}   Patient will be independent with initial HEP.  Baseline: *** Goal status: {GOALSTATUS:25110}   LONG TERM GOALS: Target date: {follow up:25551}   Patient will be independent with advanced/ongoing HEP to improve outcomes and carryover.  Baseline: *** Goal status: {GOALSTATUS:25110}  2.  Patient will report 75% improvement in low back pain to improve QOL.  Baseline: *** Goal status: {GOALSTATUS:25110}  3.  Patient will demonstrate full pain free lumbar ROM to perform ADLs.   Baseline: *** Goal status: {GOALSTATUS:25110}  4.  Patient will demonstrate improved functional strength as demonstrated by ***. Baseline: *** Goal status: {GOALSTATUS:25110}  5.  Patient will report at least 6 points improvement on modified Oswestry to demonstrate improved functional ability.   Baseline: *** Goal status: {GOALSTATUS:25110}   6.  Patient will tolerate *** min of (standing/sitting/walking) to perform ***. Baseline: *** Goal status: {GOALSTATUS:25110}  7.   Patient will report centralization of radicular symptoms.  Baseline: *** Goal status: {GOALSTATUS:25110}  8. Patient will report *** on FOTO Baseline: *** Goal status: {GOALSTATUS:25110}   PLAN:  PT FREQUENCY: {rehab frequency:25116}  PT DURATION: {rehab duration:25117}  PLANNED INTERVENTIONS: {rehab planned interventions:25118::"Therapeutic exercises","Therapeutic activity","Neuromuscular re-education","Balance training","Gait training","Patient/Family education","Self Care","Joint mobilization"}.  PLAN FOR NEXT SESSION: ***   Jena Gauss, PT, DPT 06/24/2023, 12:50 PM

## 2023-06-25 ENCOUNTER — Encounter: Payer: Self-pay | Admitting: Physical Therapy

## 2023-06-25 ENCOUNTER — Other Ambulatory Visit: Payer: Self-pay

## 2023-06-25 ENCOUNTER — Ambulatory Visit: Payer: Medicare Other | Attending: Family Medicine | Admitting: Physical Therapy

## 2023-06-25 DIAGNOSIS — M545 Low back pain, unspecified: Secondary | ICD-10-CM | POA: Insufficient documentation

## 2023-06-25 DIAGNOSIS — G8929 Other chronic pain: Secondary | ICD-10-CM | POA: Diagnosis not present

## 2023-06-25 DIAGNOSIS — M5459 Other low back pain: Secondary | ICD-10-CM | POA: Insufficient documentation

## 2023-06-25 DIAGNOSIS — R252 Cramp and spasm: Secondary | ICD-10-CM | POA: Diagnosis not present

## 2023-06-25 DIAGNOSIS — M6281 Muscle weakness (generalized): Secondary | ICD-10-CM | POA: Insufficient documentation

## 2023-07-01 ENCOUNTER — Other Ambulatory Visit (HOSPITAL_COMMUNITY): Payer: Self-pay | Admitting: Family Medicine

## 2023-07-01 DIAGNOSIS — E7800 Pure hypercholesterolemia, unspecified: Secondary | ICD-10-CM

## 2023-07-02 ENCOUNTER — Ambulatory Visit: Payer: Medicare Other

## 2023-07-02 DIAGNOSIS — M6281 Muscle weakness (generalized): Secondary | ICD-10-CM

## 2023-07-02 DIAGNOSIS — M5459 Other low back pain: Secondary | ICD-10-CM | POA: Diagnosis not present

## 2023-07-02 DIAGNOSIS — M545 Low back pain, unspecified: Secondary | ICD-10-CM | POA: Diagnosis not present

## 2023-07-02 DIAGNOSIS — G8929 Other chronic pain: Secondary | ICD-10-CM | POA: Diagnosis not present

## 2023-07-02 DIAGNOSIS — R252 Cramp and spasm: Secondary | ICD-10-CM

## 2023-07-02 NOTE — Therapy (Signed)
OUTPATIENT PHYSICAL THERAPY THORACOLUMBAR TREATMENT   Patient Name: Breanna Kirby MRN: [id number] DOB:07-13-1956, 67 y.o., female Today's Date: 07/02/2023  END OF SESSION:  PT End of Session - 07/02/23 1552     Visit Number 2    Date for PT Re-Evaluation 08/06/23    Authorization Type [employer]    Progress Note Due on Visit 10    PT Start Time 1535    PT Stop Time 1624    PT Time Calculation (min) 49 min    Activity Tolerance Patient tolerated treatment well    Behavior During Therapy [facility] for tasks assessed/performed              Past Medical History:  Diagnosis Date   Lymphoma (HCC)    Past Surgical History:  Procedure Laterality Date   CESAREAN SECTION     Patient Active Problem List   Diagnosis Date Noted   Osteoporosis 11/19/2022   Acute deep vein thrombosis (DVT) of femoral vein (HCC) 12/19/2017   Fatigue 12/03/2017   Essential hypertension 03/14/2016   Follicular lymphoma grade II of lymph nodes of multiple sites (HCC) 04/06/2015   Vitiligo 01/22/2012    PCP: Sigmund Hazel, MD   REFERRING PROVIDER: Rodolph Bong, MD   REFERRING DIAG: M54.50,G89.29 (ICD-10-CM) - Chronic low back pain, unspecified back pain laterality, unspecified whether sciatica present   Rationale for Evaluation and Treatment: Rehabilitation  THERAPY DIAG:  Other low back pain  Muscle weakness (generalized)  Cramp and spasm  ONSET DATE: June 03, 2023  SUBJECTIVE:                                                                                                                                                                                           SUBJECTIVE STATEMENT:  Patient reports less pain today, she has been doing a lot of stretches and household activities.  PERTINENT HISTORY:  Osteoporosis, lymphoma, chronic LBP, HTN, history DVT, history C-section  PAIN:  Are you having pain? Yes: NPRS scale: 2/10 Pain location: low back/sacrum, left buttock, occasionally  down LLE to ankle Pain description: dull stiffness Aggravating factors: prolonged sitting in car Relieving factors: walking, gentle stretches  PRECAUTIONS: Other: osteoporosis  RED FLAGS: None   WEIGHT BEARING RESTRICTIONS: No  FALLS:  Has patient fallen in last 6 months? No  LIVING ENVIRONMENT: Lives with: lives with their spouse Lives in: House/apartment Stairs: Yes: Internal: 15 steps; on right going up Has following equipment at home: None  OCCUPATION: retired   PLOF: Independent and Leisure: yoga, workout at [employer], active, likes yard work  PATIENT GOALS: exercises to help with sciatica and prevent  from returning  NEXT MD VISIT: not scheduled  OBJECTIVE:   DIAGNOSTIC FINDINGS:  No relevant imaging  PATIENT SURVEYS:  Modified Oswestry 7/50   COGNITION: Overall cognitive status: Within functional limits for tasks assessed     SENSATION: WFL  MUSCLE LENGTH: Hamstrings: Right 90 deg; Left 90 deg Quads - moderate tightness bil   POSTURE: No Significant postural limitations  PALPATION: Tenderness L SIJ, L lumbar paraspinals, L glutes, L piriformis.  Decreased mobility in sacrum, mild tenderness L4/5, but less than at SIJ.   LUMBAR ROM:   AROM eval  Flexion To shins, pulls  Extension WNL  Right lateral flexion To knee  Left lateral flexion To knee, feels in L LB  Right rotation WNL  Left rotation WNL, feels in L LB   (Blank rows = not tested)  LOWER EXTREMITY ROM:     Good hip mobility bil, symmetric and pain free.   LOWER EXTREMITY MMT:    MMT Right eval Left eval  Hip flexion 4+ 4+  Hip extension 4 4  Hip abduction 5 5  Hip adduction 4+ 4+  Knee flexion 4+ 4+  Knee extension 5 5  Ankle dorsiflexion 5 5  Ankle plantarflexion 5 5   (Blank rows = not tested)  LUMBAR SPECIAL TESTS:  Straight leg raise test: Negative, FABER test: Negative, and Long sit test: Positive  FUNCTIONAL TESTS:    GAIT: Distance walked: 51' Assistive device  utilized: None Level of assistance: Complete Independence Comments: WNFL, good gait speed, no deviation  TODAY'S TREATMENT:                                                                                                                              DATE:  07/02/23 Therapeutic Exercise: to improve strength and mobility.  Demo, verbal and tactile cues throughout for technique. Bike L2x56min Supine pelvic tilts 10x5" Supine bridge x 10  Hooklying ball squeeze + PPT 10x5" Resisted isometric hip ABD 10x5" bil S/L clamshell RTB 10x5" FOTO: 69.7%  06/25/23  Self Care: Findings, POC Therapeutic Exercise: to improve strength and mobility.  Demo, verbal and tactile cues throughout for technique. MET with resisted L hip extension (pushing foot on table 10 x 5 sec hold) with negative supine to long sit test after.  Bridges with pillow under sacrum.      PATIENT EDUCATION:  Education details: see above Person educated: Patient Education method: Medical illustrator Education comprehension: verbalized understanding  HOME EXERCISE PROGRAM: Access Code: [email or web address] URL: [email or web address] Date: 07/02/2023 Prepared by: Verta Ellen  Exercises - Supine Posterior Pelvic Tilt  - 1 x daily - 7 x weekly - 3 sets - 10 reps - Supine Bridge  - 1 x daily - 7 x weekly - 3 sets - 10 reps - Clamshell with Resistance  - 1 x daily - 7 x weekly - 3 sets - 10 reps - 5 sec hold - Hooklying Isometric Hip Abduction with Belt  -  1 x daily - 7 x weekly - 3 sets - 10 reps - 5 sec hold  ASSESSMENT:  CLINICAL IMPRESSION:   Pt responded well to treatment. Cues provided with exercises as needed. FOTO taken today with good score shown. No increased pain from interventions. Ambriel A Melucci would benefit form skilled physical therapy to improve core/hip/SIJ strength, decrease pain and improve activity tolerance and prevent further incidences of pain in future.    OBJECTIVE IMPAIRMENTS:  decreased activity tolerance, decreased strength, increased fascial restrictions, increased muscle spasms, impaired flexibility, and pain.   ACTIVITY LIMITATIONS: lifting, bending, sitting, and locomotion level  PARTICIPATION LIMITATIONS: driving and community activity  PERSONAL FACTORS: 1-2 comorbidities: Osteoporosis, lymphoma, chronic LBP, HTN, history DVT, history C-section  are also affecting patient's functional outcome.   REHAB POTENTIAL: Good  CLINICAL DECISION MAKING: Evolving/moderate complexity  EVALUATION COMPLEXITY: Moderate   GOALS: Goals reviewed with patient? Yes  SHORT TERM GOALS: Target date: 07/09/2023   Patient will be independent with initial HEP.  Baseline:  Goal status: IN PROGRESS   LONG TERM GOALS: Target date: 08/06/2023   Patient will be independent with advanced/ongoing HEP to improve outcomes and carryover.  Baseline:  Goal status: IN PROGRESS  2.  Patient will report 75% improvement in low back pain to improve QOL.  Baseline:  Goal status: IN PROGRESS  3.  Patient will demonstrate full pain free lumbar ROM to perform ADLs.   Baseline: see objective Goal status: IN PROGRESS  4.  Patient will demonstrate improved functional strength as demonstrated by 5/5 hip strength. Baseline: see objective Goal status: IN PROGRESS  5.  Patient will report at least 6 points improvement on modified Oswestry to demonstrate improved functional ability.  Baseline: 7/50 Goal status: IN PROGRESS   6. Patient will report expected outcome on FOTO Baseline: TBD Goal status: IN PROGRESS  PLAN:  PT FREQUENCY: 1-2x/week  PT DURATION: 6 weeks  PLANNED INTERVENTIONS: Therapeutic exercises, Therapeutic activity, Neuromuscular re-education, Balance training, Gait training, Patient/Family education, Self Care, Joint mobilization, Joint manipulation, Orthotic/Fit training, Dry Needling, Electrical stimulation, Spinal manipulation, Spinal mobilization, Cryotherapy,  Moist heat, Taping, Traction, Ultrasound, Ionotophoresis 4mg /ml Dexamethasone, Manual therapy, and Re-evaluation.  PLAN FOR NEXT SESSION: recheck supine to long sit, HEP for core/extensor strengthening, piriformis stretches   Darleene Cleaver, PTA 07/02/2023, 4:48 PM

## 2023-07-04 ENCOUNTER — Ambulatory Visit: Payer: Medicare Other

## 2023-07-04 ENCOUNTER — Other Ambulatory Visit: Payer: Self-pay

## 2023-07-04 ENCOUNTER — Ambulatory Visit (HOSPITAL_BASED_OUTPATIENT_CLINIC_OR_DEPARTMENT_OTHER)
Admission: RE | Admit: 2023-07-04 | Discharge: 2023-07-04 | Disposition: A | Discharge status: Home / Self Care | Payer: Medicare Other | Source: Ambulatory Visit | Attending: Family Medicine | Admitting: Family Medicine

## 2023-07-04 DIAGNOSIS — M5459 Other low back pain: Secondary | ICD-10-CM

## 2023-07-04 DIAGNOSIS — G8929 Other chronic pain: Secondary | ICD-10-CM | POA: Diagnosis not present

## 2023-07-04 DIAGNOSIS — M545 Low back pain, unspecified: Secondary | ICD-10-CM | POA: Diagnosis not present

## 2023-07-04 DIAGNOSIS — R252 Cramp and spasm: Secondary | ICD-10-CM | POA: Diagnosis not present

## 2023-07-04 DIAGNOSIS — M6281 Muscle weakness (generalized): Secondary | ICD-10-CM

## 2023-07-04 DIAGNOSIS — E7800 Pure hypercholesterolemia, unspecified: Secondary | ICD-10-CM | POA: Insufficient documentation

## 2023-07-04 NOTE — Therapy (Signed)
OUTPATIENT PHYSICAL THERAPY THORACOLUMBAR TREATMENT   Patient Name: LEIGH BLAS MRN: [id number] DOB:[birth year], 67 y.o., female Today's Date: 07/04/2023  END OF SESSION:  PT End of Session - 07/04/23 1529     Visit Number 3    Date for PT Re-Evaluation 08/06/23    Progress Note Due on Visit 10    PT Start Time 1530    PT Stop Time 1615    PT Time Calculation (min) 45 min               Past Medical History:  Diagnosis Date   Lymphoma [facility])    Past Surgical History:  Procedure Laterality Date   CESAREAN SECTION     Patient Active Problem List   Diagnosis Date Noted   Osteoporosis 11/19/2022   Acute deep vein thrombosis (DVT) of femoral vein (HCC) 12/19/2017   Fatigue 12/03/2017   Essential hypertension 03/14/2016   Follicular lymphoma grade II of lymph nodes of multiple sites (HCC) 04/06/2015   Vitiligo 01/22/2012    PCP: Sigmund Hazel, MD   REFERRING PROVIDER: Rodolph Bong, MD   REFERRING DIAG: M54.50,G89.29 (ICD-10-CM) - Chronic low back pain, unspecified back pain laterality, unspecified whether sciatica present   Rationale for Evaluation and Treatment: Rehabilitation  THERAPY DIAG:  Other low back pain  Muscle weakness (generalized)  Cramp and spasm  ONSET DATE: June 03, 2023  SUBJECTIVE:                                                                                                                                                                                           SUBJECTIVE STATEMENT:  Patient reports less pain today, has been cleaning the pool, trying to set core with reaching activities. Slight pain L lateral thigh HISTORY:  Osteoporosis, lymphoma, chronic LBP, HTN, history DVT, history C-section  PAIN:  Are you having pain? Yes: NPRS scale: 2/10 Pain location: low back/sacrum, left buttock, occasionally down LLE to ankle Pain description: dull stiffness Aggravating factors: prolonged sitting in car Relieving factors:  walking, gentle stretches  PRECAUTIONS: Other: osteoporosis  RED FLAGS: None   WEIGHT BEARING RESTRICTIONS: No  FALLS:  Has patient fallen in last 6 months? No  LIVING ENVIRONMENT: Lives with: lives with their spouse Lives in: House/apartment Stairs: Yes: Internal: 15 steps; on right going up Has following equipment at home: None  OCCUPATION: retired   PLOF: Independent and Leisure: yoga, workout at [employer], active, likes yard work  PATIENT GOALS: exercises to help with sciatica and prevent from returning  NEXT MD VISIT: not scheduled  OBJECTIVE:   DIAGNOSTIC FINDINGS:  No relevant imaging  PATIENT SURVEYS:  Modified Oswestry 7/50   COGNITION: Overall cognitive status: Within functional limits for tasks assessed     SENSATION: WFL  MUSCLE LENGTH: Hamstrings: Right 90 deg; Left 90 deg Quads - moderate tightness bil   POSTURE: No Significant postural limitations  PALPATION: Tenderness L SIJ, L lumbar paraspinals, L glutes, L piriformis.  Decreased mobility in sacrum, mild tenderness L4/5, but less than at SIJ.   LUMBAR ROM:   AROM eval  Flexion To shins, pulls  Extension WNL  Right lateral flexion To knee  Left lateral flexion To knee, feels in L LB  Right rotation WNL  Left rotation WNL, feels in L LB   (Blank rows = not tested)  LOWER EXTREMITY ROM:     Good hip mobility bil, symmetric and pain free.   LOWER EXTREMITY MMT:    MMT Right eval Left eval  Hip flexion 4+ 4+  Hip extension 4 4  Hip abduction 5 5  Hip adduction 4+ 4+  Knee flexion 4+ 4+  Knee extension 5 5  Ankle dorsiflexion 5 5  Ankle plantarflexion 5 5   (Blank rows = not tested)  LUMBAR SPECIAL TESTS:  Straight leg raise test: Negative, FABER test: Negative, and Long sit test: Positive  FUNCTIONAL TESTS:    GAIT: Distance walked: 27' Assistive device utilized: None Level of assistance: Complete Independence Comments: WNFL, good gait speed, no  deviation       TODAY'S TREATMENT:                                                                                                                              DATE:  07/04/23:  Assessed for cluster of SI testing, March test, supine to sit test, thigh thrust, Pearlean Brownie - today.  Standing posture with symmetrical iliac crests Manual:  some discomfort, tenderness L gluteals and piriformis, so utilized TPDN Trigger Point Dry-Needling  Treatment instructions: Expect mild to moderate muscle soreness. S/S of pneumothorax if dry needled over a lung field, and to seek immediate medical attention should they occur. Patient verbalized understanding of these instructions and education. Patient Consent Given: Yes Education handout provided: No Muscles treated: L glut medius, L glut max, l piriformis  Treatment response/outcome: Twitch Response Elicited and Palpable Increase in Muscle Length  Muscle energy for lumbar region:  patient with increased L lateral thigh pain with standing L side bending today, so utilized R side lying with R SB lumbar stretch with gentle pelvic ant rotation with contract /relax posteriorly, one set, 5 sec holds, 5 reps  Muscle energy side lying on L for lumbar flex, rot , SB restriction,  5 sec holds, 5 reps.   Reassessed standing L side bending at end of session, L SB did not reproduce familiar pain but patient reported general fatigue and light tingling L lat /ant thigh at end of session. 07/02/23 Therapeutic Exercise: to improve strength and mobility.  Demo, verbal and tactile cues throughout for technique. Bike  L2x87min Supine pelvic tilts 10x5" Supine bridge x 10  Hooklying ball squeeze + PPT 10x5" Resisted isometric hip ABD 10x5" bil S/L clamshell RTB 10x5" FOTO: 69.7%  06/25/23  Self Care: Findings, POC Therapeutic Exercise: to improve strength and mobility.  Demo, verbal and tactile cues throughout for technique. MET with resisted L hip extension (pushing foot  on table 10 x 5 sec hold) with negative supine to long sit test after.  Bridges with pillow under sacrum.      PATIENT EDUCATION:  Education details: see above Person educated: Patient Education method: Medical illustrator Education comprehension: verbalized understanding  HOME EXERCISE PROGRAM: Access Code: [email or web address] URL: [email or web address] Date: 07/02/2023 Prepared by: Verta Ellen  Exercises - Supine Posterior Pelvic Tilt  - 1 x daily - 7 x weekly - 3 sets - 10 reps - Supine Bridge  - 1 x daily - 7 x weekly - 3 sets - 10 reps - Clamshell with Resistance  - 1 x daily - 7 x weekly - 3 sets - 10 reps - 5 sec hold - Hooklying Isometric Hip Abduction with Belt  - 1 x daily - 7 x weekly - 3 sets - 10 reps - 5 sec hold  ASSESSMENT:  CLINICAL IMPRESSION:   Pt overall with low grade pain/ Sx L lat/post hip today.  Objectively had decreased L side bending flexibility and tender over gluts and piriformis L.  Therefore utilized manual techniques as indicated above.  She had been in the habit of exercising with resistance training 3 x week but has not done in last month since these Sx developed. Advised her to resume light strengthening ex as her Sx are much less irritable, to see how she responds , while she is attending PT.  Advised her to expect soreness in gluts due to the TPDN for one to 2 days as well. .    OBJECTIVE IMPAIRMENTS: decreased activity tolerance, decreased strength, increased fascial restrictions, increased muscle spasms, impaired flexibility, and pain.   ACTIVITY LIMITATIONS: lifting, bending, sitting, and locomotion level  PARTICIPATION LIMITATIONS: driving and community activity  PERSONAL FACTORS: 1-2 comorbidities: Osteoporosis, lymphoma, chronic LBP, HTN, history DVT, history C-section  are also affecting patient's functional outcome.   REHAB POTENTIAL: Good  CLINICAL DECISION MAKING: Evolving/moderate complexity  EVALUATION COMPLEXITY:  Moderate   GOALS: Goals reviewed with patient? Yes  SHORT TERM GOALS: Target date: 07/09/2023   Patient will be independent with initial HEP.  Baseline:  Goal status: IN PROGRESS   LONG TERM GOALS: Target date: 08/06/2023   Patient will be independent with advanced/ongoing HEP to improve outcomes and carryover.  Baseline:  Goal status: IN PROGRESS  2.  Patient will report 75% improvement in low back pain to improve QOL.  Baseline:  Goal status: IN PROGRESS  3.  Patient will demonstrate full pain free lumbar ROM to perform ADLs.   Baseline: see objective Goal status: IN PROGRESS  4.  Patient will demonstrate improved functional strength as demonstrated by 5/5 hip strength. Baseline: see objective Goal status: IN PROGRESS  5.  Patient will report at least 6 points improvement on modified Oswestry to demonstrate improved functional ability.  Baseline: 7/50 Goal status: IN PROGRESS   6. Patient will report expected outcome on FOTO Baseline: TBD Goal status: IN PROGRESS  PLAN:  PT FREQUENCY: 1-2x/week  PT DURATION: 6 weeks  PLANNED INTERVENTIONS: Therapeutic exercises, Therapeutic activity, Neuromuscular re-education, Balance training, Gait training, Patient/Family education, Self Care, Joint mobilization, Joint manipulation, Orthotic/Fit training, Dry Needling,  Electrical stimulation, Spinal manipulation, Spinal mobilization, Cryotherapy, Moist heat, Taping, Traction, Ultrasound, Ionotophoresis 4mg /ml Dexamethasone, Manual therapy, and Re-evaluation.  PLAN FOR NEXT SESSION: how did she respond to TPDN and reassess SI jt and muscles L post hip, how is she responding to resuming light exercise.   Haddon Fyfe L Kadar Chance, PT, DPT, OCS 07/04/2023, 4:27 PM

## 2023-07-09 ENCOUNTER — Ambulatory Visit: Payer: Medicare Other

## 2023-07-09 ENCOUNTER — Other Ambulatory Visit: Payer: Self-pay

## 2023-07-09 DIAGNOSIS — M5459 Other low back pain: Secondary | ICD-10-CM

## 2023-07-09 DIAGNOSIS — M6281 Muscle weakness (generalized): Secondary | ICD-10-CM | POA: Diagnosis not present

## 2023-07-09 DIAGNOSIS — R252 Cramp and spasm: Secondary | ICD-10-CM | POA: Diagnosis not present

## 2023-07-09 DIAGNOSIS — G8929 Other chronic pain: Secondary | ICD-10-CM | POA: Diagnosis not present

## 2023-07-09 DIAGNOSIS — M545 Low back pain, unspecified: Secondary | ICD-10-CM | POA: Diagnosis not present

## 2023-07-09 NOTE — Therapy (Signed)
OUTPATIENT PHYSICAL THERAPY THORACOLUMBAR TREATMENT/DISCHARGE SUMMARY   Patient Name: Breanna Kirby MRN: [id number] DOB:1955-09-27, 67 y.o., female Today's Date: 07/09/2023  END OF SESSION:  PT End of Session - 07/09/23 0934     Visit Number 4    Date for PT Re-Evaluation 08/06/23    Authorization Type [employer]    Progress Note Due on Visit 10    PT Start Time 0934    PT Stop Time 1015    PT Time Calculation (min) 41 min    Activity Tolerance Patient tolerated treatment well    Behavior During Therapy [facility] for tasks assessed/performed                Past Medical History:  Diagnosis Date   Lymphoma [facility])    Past Surgical History:  Procedure Laterality Date   CESAREAN SECTION     Patient Active Problem List   Diagnosis Date Noted   Osteoporosis 11/19/2022   Acute deep vein thrombosis (DVT) of femoral vein (HCC) 12/19/2017   Fatigue 12/03/2017   Essential hypertension 03/14/2016   Follicular lymphoma grade II of lymph nodes of multiple sites (HCC) 04/06/2015   Vitiligo 01/22/2012    PCP: Sigmund Hazel, MD   REFERRING PROVIDER: Rodolph Bong, MD   REFERRING DIAG: M54.50,G89.29 (ICD-10-CM) - Chronic low back pain, unspecified back pain laterality, unspecified whether sciatica present   Rationale for Evaluation and Treatment: Rehabilitation  THERAPY DIAG:  Other low back pain  Muscle weakness (generalized)  Cramp and spasm  ONSET DATE: June 03, 2023  SUBJECTIVE:                                                                                                                                                                                           SUBJECTIVE STATEMENT:  Patient reports less pain today, responded well to last session, has made some adaptations to seating at home,  resumed light therex without difficulty including her yoga for osteoporosis HISTORY:  Osteoporosis, lymphoma, chronic LBP, HTN, history DVT, history C-section  PAIN:   Are you having pain? Yes: NPRS scale: 2/10 Pain location: low back/sacrum, left buttock, occasionally down LLE to ankle Pain description: dull stiffness Aggravating factors: prolonged sitting in car Relieving factors: walking, gentle stretches  PRECAUTIONS: Other: osteoporosis  RED FLAGS: None   WEIGHT BEARING RESTRICTIONS: No  FALLS:  Has patient fallen in last 6 months? No  LIVING ENVIRONMENT: Lives with: lives with their spouse Lives in: House/apartment Stairs: Yes: Internal: 15 steps; on right going up Has following equipment at home: None  OCCUPATION: retired   PLOF: Independent and Leisure: yoga, workout at [employer],  active, likes yard work  PATIENT GOALS: exercises to help with sciatica and prevent from returning  NEXT MD VISIT: not scheduled  OBJECTIVE:   DIAGNOSTIC FINDINGS:  No relevant imaging  PATIENT SURVEYS:  Modified Oswestry 7/50   COGNITION: Overall cognitive status: Within functional limits for tasks assessed     SENSATION: WFL  MUSCLE LENGTH: Hamstrings: Right 90 deg; Left 90 deg Quads - moderate tightness bil   POSTURE: No Significant postural limitations  PALPATION: Tenderness L SIJ, L lumbar paraspinals, L glutes, L piriformis.  Decreased mobility in sacrum, mild tenderness L4/5, but less than at SIJ.   LUMBAR ROM:   AROM eval  Flexion To shins, pulls  Extension WNL  Right lateral flexion To knee  Left lateral flexion To knee, feels in L LB  Right rotation WNL  Left rotation WNL, feels in L LB   (Blank rows = not tested)  LOWER EXTREMITY ROM:     Good hip mobility bil, symmetric and pain free.   LOWER EXTREMITY MMT:    MMT Right eval Left eval  Hip flexion 4+ 4+  Hip extension 4 4  Hip abduction 5 5  Hip adduction 4+ 4+  Knee flexion 4+ 4+  Knee extension 5 5  Ankle dorsiflexion 5 5  Ankle plantarflexion 5 5   (Blank rows = not tested)  LUMBAR SPECIAL TESTS:  Straight leg raise test: Negative, FABER test: Negative,  and Long sit test: Positive  FUNCTIONAL TESTS:    GAIT: Distance walked: 65' Assistive device utilized: None Level of assistance: Complete Independence Comments: WNFL, good gait speed, no deviation       TODAY'S TREATMENT:                                                                                                                              DATE:  07/09/23:  Instructed in a variety of other therex designed for core stability, proximal hip strength, including:  Prone on elbows,abdomen over 2 pillows for alt arm and leg lifts Prone for heel squeezes, isometrics to engage multifidi Seated isometric hip abd/ ER 5 to 10 sec holds to engage multifidi and improve motor control/ muscle memory Standing penguins,and for/back rocks, small amplitude with green t band around thighs to engage prox hip musculature  07/04/23:  Assessed for cluster of SI testing, March test, supine to sit test, thigh thrust, Pearlean Brownie - today.  Standing posture with symmetrical iliac crests Manual:  some discomfort, tenderness L gluteals and piriformis, so utilized TPDN Trigger Point Dry-Needling  Treatment instructions: Expect mild to moderate muscle soreness. S/S of pneumothorax if dry needled over a lung field, and to seek immediate medical attention should they occur. Patient verbalized understanding of these instructions and education. Patient Consent Given: Yes Education handout provided: No Muscles treated: L glut medius, L glut max, l piriformis  Treatment response/outcome: Twitch Response Elicited and Palpable Increase in Muscle Length  Muscle energy for lumbar region:  patient with increased L lateral thigh pain with standing L side bending today, so utilized R side lying with R SB lumbar stretch with gentle pelvic ant rotation with contract /relax posteriorly, one set, 5 sec holds, 5 reps  Muscle energy side lying on L for lumbar flex, rot , SB restriction,  5 sec holds, 5 reps.   Reassessed  standing L side bending at end of session, L SB did not reproduce familiar pain but patient reported general fatigue and light tingling L lat /ant thigh at end of session. 07/02/23 Therapeutic Exercise: to improve strength and mobility.  Demo, verbal and tactile cues throughout for technique. Bike L2x82min Supine pelvic tilts 10x5" Supine bridge x 10  Hooklying ball squeeze + PPT 10x5" Resisted isometric hip ABD 10x5" bil S/L clamshell RTB 10x5" FOTO: 69.7%  06/25/23  Self Care: Findings, POC Therapeutic Exercise: to improve strength and mobility.  Demo, verbal and tactile cues throughout for technique. MET with resisted L hip extension (pushing foot on table 10 x 5 sec hold) with negative supine to long sit test after.  Bridges with pillow under sacrum.      PATIENT EDUCATION:  Education details: see above Person educated: Patient Education method: Medical illustrator Education comprehension: verbalized understanding  HOME EXERCISE PROGRAM: Access Code: [email or web address] URL: [email or web address] Date: 07/02/2023 Prepared by: Verta Ellen  Exercises - Supine Posterior Pelvic Tilt  - 1 x daily - 7 x weekly - 3 sets - 10 reps - Supine Bridge  - 1 x daily - 7 x weekly - 3 sets - 10 reps - Clamshell with Resistance  - 1 x daily - 7 x weekly - 3 sets - 10 reps - 5 sec hold - Hooklying Isometric Hip Abduction with Belt  - 1 x daily - 7 x weekly - 3 sets - 10 reps - 5 sec hold  ASSESSMENT:  CLINICAL IMPRESSION: Patient has completed skilled physical therapy at this time , her Sx have basically resolved L LE, she has made some adaptations to her sitting posture at home, and has resumed her original exercise routine without difficulty.  She did request more therex today for core/ spine stabilization.     OBJECTIVE IMPAIRMENTS: decreased activity tolerance, decreased strength, increased fascial restrictions, increased muscle spasms, impaired flexibility, and pain.    ACTIVITY LIMITATIONS: lifting, bending, sitting, and locomotion level  PARTICIPATION LIMITATIONS: driving and community activity  PERSONAL FACTORS: 1-2 comorbidities: Osteoporosis, lymphoma, chronic LBP, HTN, history DVT, history C-section  are also affecting patient's functional outcome.   REHAB POTENTIAL: Good  CLINICAL DECISION MAKING: Evolving/moderate complexity  EVALUATION COMPLEXITY: Moderate   GOALS: Goals reviewed with patient? Yes  SHORT TERM GOALS: Target date: 07/09/2023   Patient will be independent with initial HEP.  Baseline:  Goal status: IN PROGRESS   LONG TERM GOALS: Target date: 08/06/2023   Patient will be independent with advanced/ongoing HEP to improve outcomes and carryover.  Baseline:  Goal status: IN PROGRESS 07/09/23: met  2.  Patient will report 75% improvement in low back pain to improve QOL.  Baseline:  Goal status: IN PROGRESS 07/09/23: met  3.  Patient will demonstrate full pain free lumbar ROM to perform ADLs.   Baseline: see objective Goal status: IN PROGRESS10/15/24; met  4.  Patient will demonstrate improved functional strength as demonstrated by 5/5 hip strength. Baseline: see objective Goal status: IN PROGRESS 07/09/23: met  5.  Patient will report at least 6 points improvement on modified Oswestry to demonstrate improved functional  ability.  Baseline: 7/50 Goal status: IN PROGRESS 0/50, goal met 07/09/23  6. Patient will report expected outcome on FOTO Baseline: TBD Goal status: IN PROGRESS NA  PLAN:  PT FREQUENCY: 1-2x/week  PT DURATION: 6 weeks  PLANNED INTERVENTIONS: Therapeutic exercises, Therapeutic activity, Neuromuscular re-education, Balance training, Gait training, Patient/Family education, Self Care, Joint mobilization, Joint manipulation, Orthotic/Fit training, Dry Needling, Electrical stimulation, Spinal manipulation, Spinal mobilization, Cryotherapy, Moist heat, Taping, Traction, Ultrasound, Ionotophoresis  4mg /ml Dexamethasone, Manual therapy, and Re-evaluation.  PLAN FOR NEXT SESSION: DC today  Concepcion Kirkpatrick L Landa Mullinax, PT, DPT, OCS 07/09/2023, 6:05 PM

## 2023-07-10 ENCOUNTER — Ambulatory Visit
Admission: RE | Admit: 2023-07-10 | Discharge: 2023-07-10 | Disposition: A | Discharge status: Home / Self Care | Payer: Medicare Other | Source: Ambulatory Visit | Attending: Family Medicine | Admitting: Family Medicine

## 2023-07-10 DIAGNOSIS — Z1231 Encounter for screening mammogram for malignant neoplasm of breast: Secondary | ICD-10-CM

## 2023-07-11 ENCOUNTER — Encounter: Payer: Medicare Other | Admitting: Physical Therapy

## 2023-07-17 DIAGNOSIS — F411 Generalized anxiety disorder: Secondary | ICD-10-CM | POA: Diagnosis not present

## 2023-07-18 ENCOUNTER — Encounter: Payer: Medicare Other | Admitting: Physical Therapy

## 2023-08-01 ENCOUNTER — Encounter: Payer: Self-pay | Admitting: Family Medicine

## 2023-08-01 ENCOUNTER — Ambulatory Visit: Payer: Medicare Other

## 2023-08-01 ENCOUNTER — Ambulatory Visit: Payer: Medicare Other | Admitting: Family Medicine

## 2023-08-01 VITALS — BP 140/82 | HR 84 | Ht 61.75 in | Wt 113.0 lb

## 2023-08-01 DIAGNOSIS — Z043 Encounter for examination and observation following other accident: Secondary | ICD-10-CM | POA: Diagnosis not present

## 2023-08-01 DIAGNOSIS — M546 Pain in thoracic spine: Secondary | ICD-10-CM | POA: Diagnosis not present

## 2023-08-01 DIAGNOSIS — M5136 Other intervertebral disc degeneration, lumbar region with discogenic back pain only: Secondary | ICD-10-CM | POA: Diagnosis not present

## 2023-08-01 DIAGNOSIS — M48061 Spinal stenosis, lumbar region without neurogenic claudication: Secondary | ICD-10-CM | POA: Diagnosis not present

## 2023-08-01 DIAGNOSIS — M545 Low back pain, unspecified: Secondary | ICD-10-CM | POA: Diagnosis not present

## 2023-08-01 NOTE — Progress Notes (Signed)
   I, Stevenson Clinch, CMA acting as a scribe for Clementeen Graham, MD.  Breanna Kirby is a 67 y.o. female who presents to [facility] at [facility] today for left flank. Pt was previously seen by Dr. Denyse Amass on 05/22/23 for osteoporosis management and was later referred to PT for LBP.  Today, pt reports falling this past Sunday morning. She was skimming leaves from the pool, stepped laterally with the left leg, and ground gave way and she fell to the left catching herself with her left arm. Bruising present on left side. Used ice immediately following injury. Denies bowel/bladder dysfunction. Has grabbing SOB with deep breathing/sharp pain. Sx improving since onset. Notes bruising easily. Got the most relief with IBU.   Radiating pain: left side ribs LE numbness/tingling: no LE weakness: no Aggravates: side bending, laughing, coughing Treatments tried: Gabapentin, ice, IBU, Tylenol Arthritis  Pertinent review of systems: No fevers or chills  Relevant historical information: DVT.  Follicular lymphoma.  Osteoporosis.   Exam:  BP (!) 140/82   Pulse 84   Ht 5' 1.75" (1.568 m)   Wt 113 lb (51.3 kg)   SpO2 98%   BMI 20.84 kg/m  General: Well Developed, well nourished, and in no acute distress.   MSK: L-spine normal appearing Nontender palpation midline normal lumbar motion.  Lower extremity strength is intact.    Lab and Radiology Results  X-ray images lumbar spine obtained today personally and independently interpreted Mild degenerative changes L5-S1.  No acute fractures are visible. Await formal radiology review    Assessment and Plan: 67 y.o. female with acute low back pain after falling.  Fracture very unlikely based on x-ray appearance and physical exam.  Plan to advance activity as tolerated.  Recheck as needed.   PDMP not reviewed this encounter. Orders Placed This Encounter  Procedures   DG Lumbar Spine 2-3 Views    Standing Status:   Future    Number  of Occurrences:   1    Standing Expiration Date:   07/31/2024    Order Specific Question:   Reason for Exam (SYMPTOM  OR DIAGNOSIS REQUIRED)    Answer:   eval low back pain after fall    Order Specific Question:   Preferred imaging location?    Answer:   [facility]   No orders of the defined types were placed in this encounter.    Discussed warning signs or symptoms. Please see discharge instructions. Patient expresses understanding.   The above documentation has been reviewed and is accurate and complete Clementeen Graham, M.D.

## 2023-08-01 NOTE — Patient Instructions (Signed)
Thank you for coming in today.   Please get an Xray today before you leave   Start active.   If needed we could add PT.

## 2023-08-06 ENCOUNTER — Telehealth (HOSPITAL_BASED_OUTPATIENT_CLINIC_OR_DEPARTMENT_OTHER): Payer: Self-pay | Admitting: Family Medicine

## 2023-08-08 DIAGNOSIS — Z7689 Persons encountering health services in other specified circumstances: Secondary | ICD-10-CM | POA: Diagnosis not present

## 2023-08-08 DIAGNOSIS — I10 Essential (primary) hypertension: Secondary | ICD-10-CM | POA: Diagnosis not present

## 2023-08-08 DIAGNOSIS — I2510 Atherosclerotic heart disease of native coronary artery without angina pectoris: Secondary | ICD-10-CM | POA: Diagnosis not present

## 2023-08-08 DIAGNOSIS — E782 Mixed hyperlipidemia: Secondary | ICD-10-CM | POA: Diagnosis not present

## 2023-08-09 DIAGNOSIS — Z86718 Personal history of other venous thrombosis and embolism: Secondary | ICD-10-CM | POA: Diagnosis not present

## 2023-08-09 DIAGNOSIS — Z23 Encounter for immunization: Secondary | ICD-10-CM | POA: Diagnosis not present

## 2023-08-09 DIAGNOSIS — R590 Localized enlarged lymph nodes: Secondary | ICD-10-CM | POA: Diagnosis not present

## 2023-08-09 DIAGNOSIS — C8218 Follicular lymphoma grade II, lymph nodes of multiple sites: Secondary | ICD-10-CM | POA: Diagnosis not present

## 2023-08-12 DIAGNOSIS — I2510 Atherosclerotic heart disease of native coronary artery without angina pectoris: Secondary | ICD-10-CM | POA: Diagnosis not present

## 2023-08-12 DIAGNOSIS — E782 Mixed hyperlipidemia: Secondary | ICD-10-CM | POA: Diagnosis not present

## 2023-08-12 DIAGNOSIS — I10 Essential (primary) hypertension: Secondary | ICD-10-CM | POA: Diagnosis not present

## 2023-08-13 DIAGNOSIS — Z7689 Persons encountering health services in other specified circumstances: Secondary | ICD-10-CM | POA: Diagnosis not present

## 2023-08-19 IMAGING — MG MM DIGITAL SCREENING BILAT W/ TOMO AND CAD
8 series · 9 of 24 positions shown · non-contrast
Comparison: Previous exam(s).

CLINICAL DATA: Screening.

EXAM:
DIGITAL SCREENING BILATERAL MAMMOGRAM WITH TOMOSYNTHESIS AND CAD
TECHNIQUE: Bilateral screening digital craniocaudal and mediolateral oblique
mammograms were obtained. Bilateral screening digital breast
tomosynthesis was performed. The images were evaluated with
computer-aided detection.

[L MLO synth-2D]
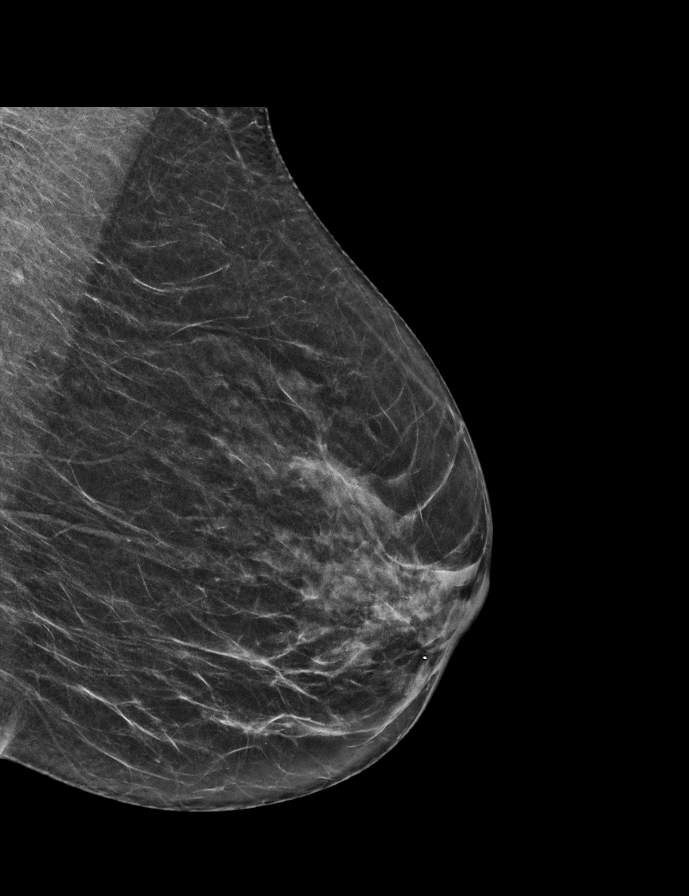

[R CC synth-2D]
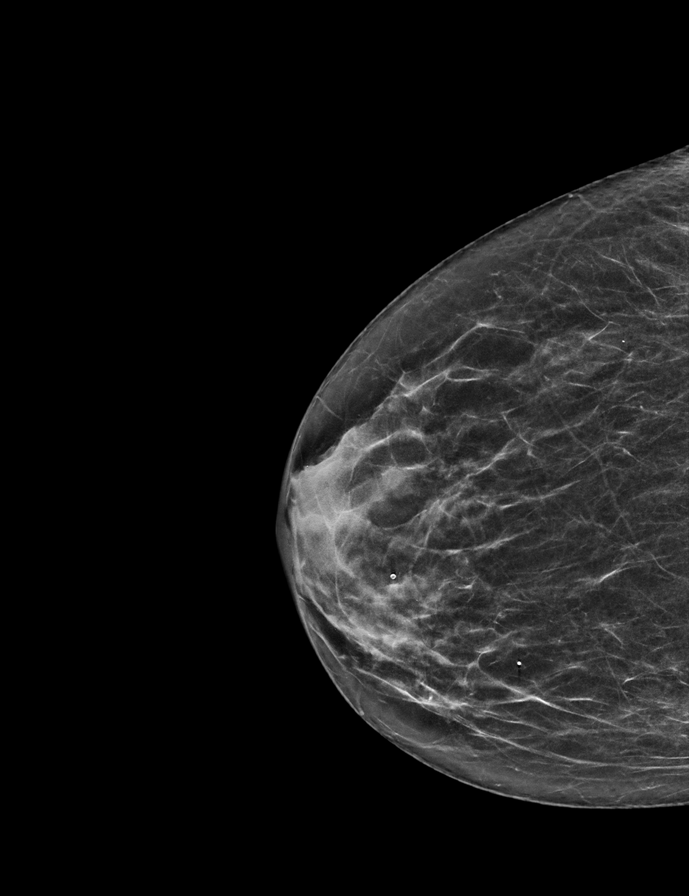

[R MLO synth-2D]
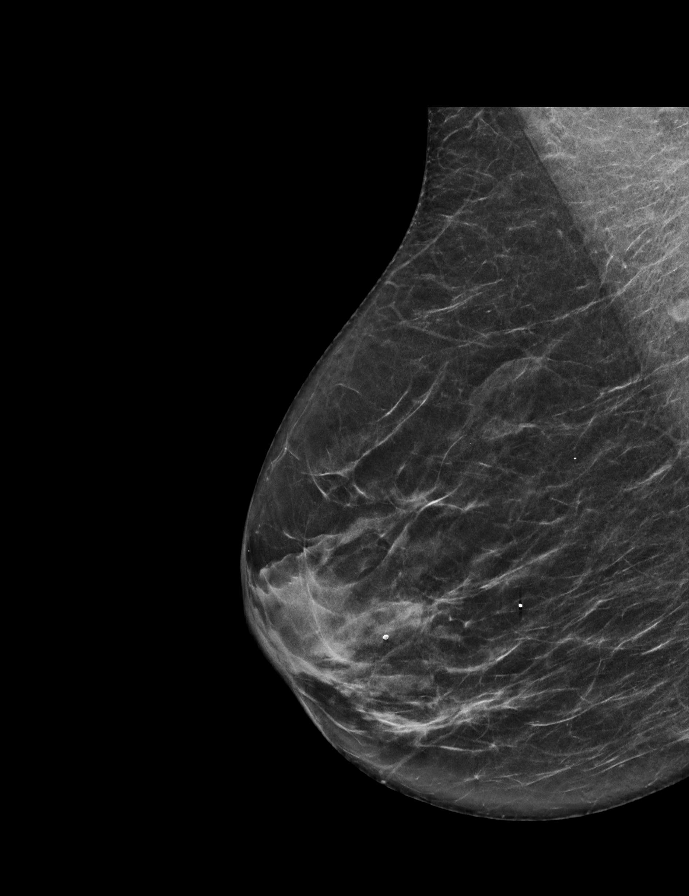

[L CC synth-2D]
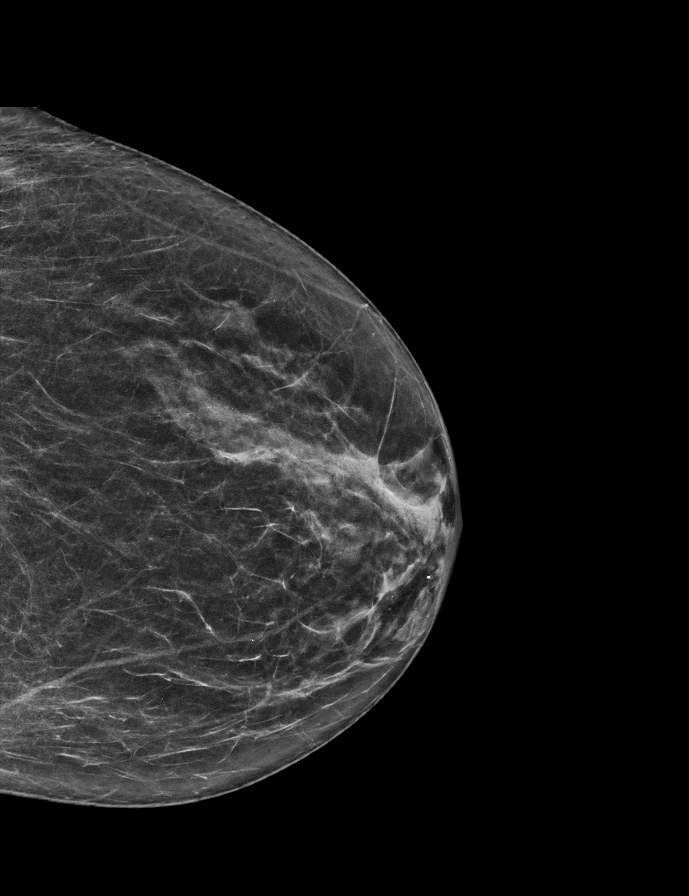

[R CC tomo · 2 of 54 frames shown]
[frame 18/54]
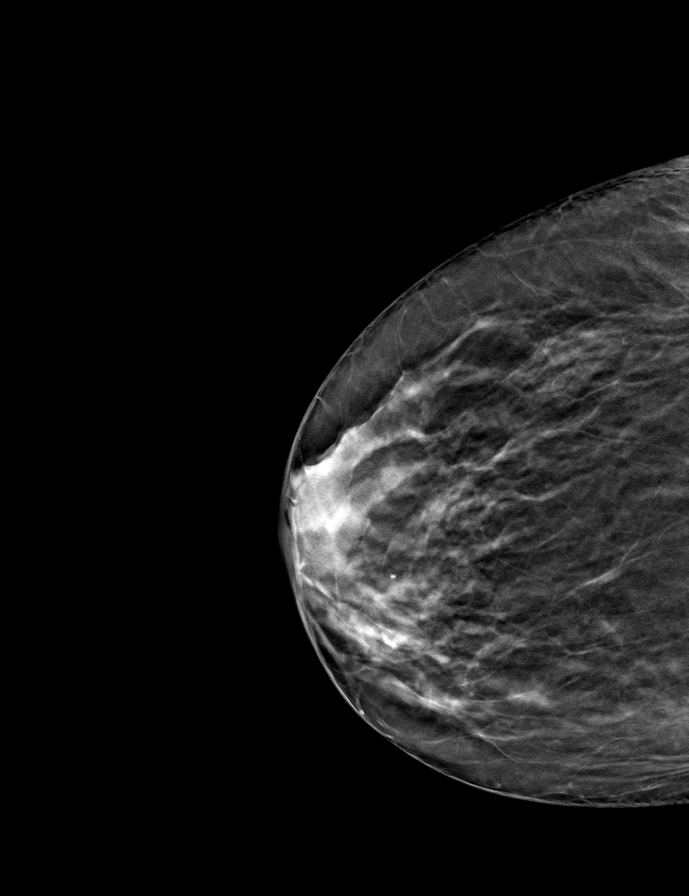
[frame 27/54]
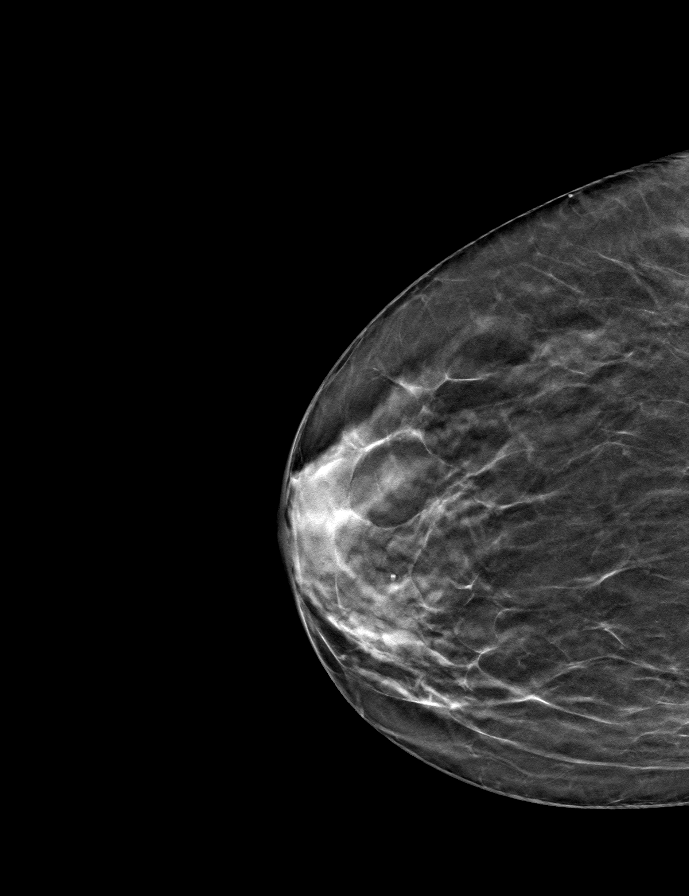

[L CC tomo · tomo slice 28/55.0]
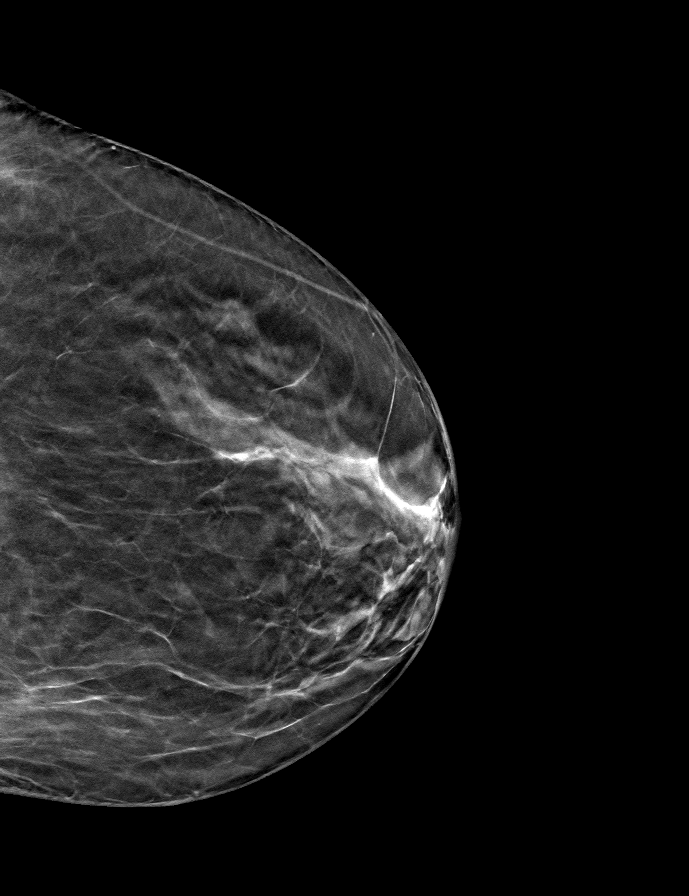

[R MLO tomo · tomo slice 29/56.0]
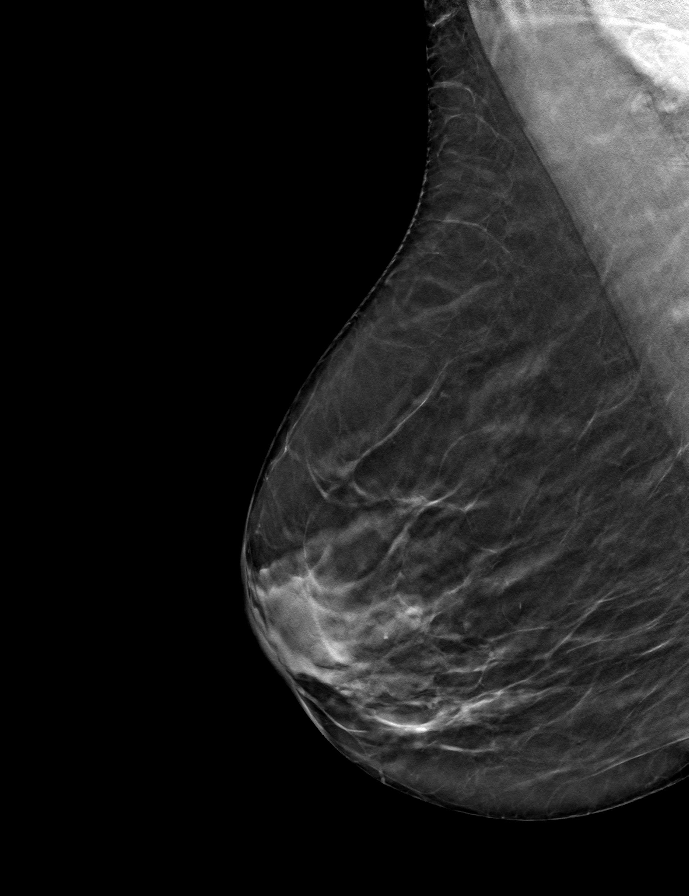

[L MLO tomo · tomo slice 27/53.0]
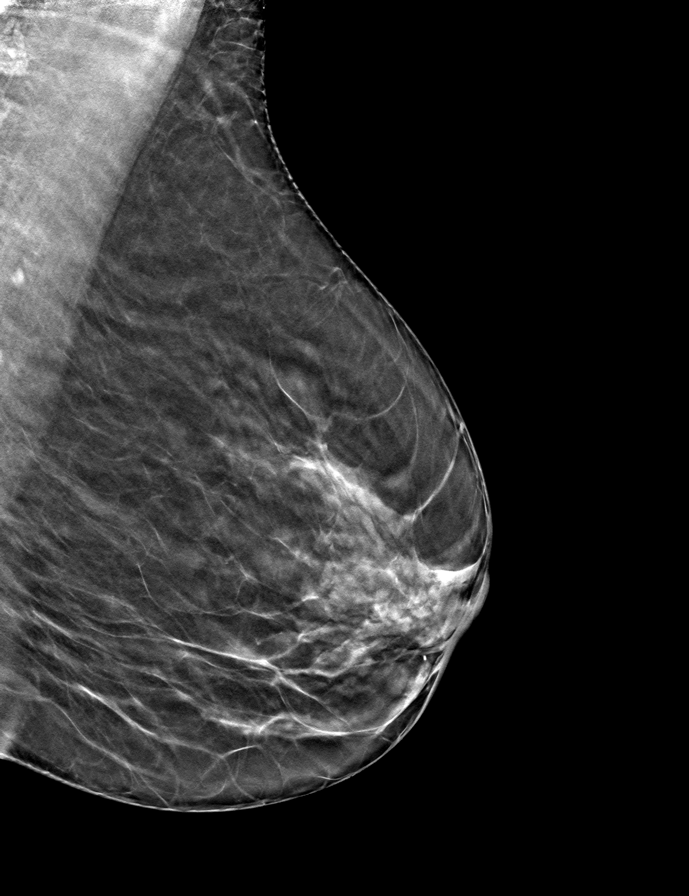

[9 of 24 positions shown; findings below may reference images not displayed]

ACR Breast Density Category b: There are scattered areas of
fibroglandular density.
FINDINGS: There are no findings suspicious for malignancy.
IMPRESSION: No mammographic evidence of malignancy. A result letter of this
screening mammogram will be mailed directly to the patient.

RECOMMENDATION:
Screening mammogram in one year. (Code:51-O-LD2)

BI-RADS CATEGORY  1: Negative.

## 2023-08-21 NOTE — Progress Notes (Signed)
Low back x-ray shows arthritis in the low back

## 2023-09-02 ENCOUNTER — Ambulatory Visit (INDEPENDENT_AMBULATORY_CARE_PROVIDER_SITE_OTHER): Payer: Medicare Other

## 2023-09-02 ENCOUNTER — Encounter: Payer: Self-pay | Admitting: Family Medicine

## 2023-09-02 ENCOUNTER — Ambulatory Visit: Payer: Medicare Other | Admitting: Family Medicine

## 2023-09-02 VITALS — BP 130/82 | HR 87 | Ht 61.75 in | Wt 108.0 lb

## 2023-09-02 DIAGNOSIS — G8929 Other chronic pain: Secondary | ICD-10-CM | POA: Diagnosis not present

## 2023-09-02 DIAGNOSIS — M160 Bilateral primary osteoarthritis of hip: Secondary | ICD-10-CM | POA: Diagnosis not present

## 2023-09-02 DIAGNOSIS — M545 Low back pain, unspecified: Secondary | ICD-10-CM | POA: Diagnosis not present

## 2023-09-02 DIAGNOSIS — M25552 Pain in left hip: Secondary | ICD-10-CM | POA: Diagnosis not present

## 2023-09-02 NOTE — Patient Instructions (Signed)
Thank you for coming in today.   Offloading cushion.   PT Kathryne Sharper.   Please get an Xray today before you leave   Recheck in 6 weeks.   Let me know sooner if this is not working.

## 2023-09-02 NOTE — Progress Notes (Signed)
   I, Stevenson Clinch, CMA acting as a scribe for Clementeen Graham, MD.  Breanna Kirby is a 67 y.o. female who presents to [facility] at [facility] today for hip pain. Pt was previously seen by Dr. Denyse Amass on 08/01/23 for back pain.  Pain ongoing for about 4 to 6 weeks.  Pain located in the mid low back region and on the left buttocks.  Pain does not radiate down the leg.  No weakness or numbness.  No bowel or bladder dysfunction.  Pain worse with activity and sitting especially in a reclined position..  She has tried home exercise program for this which has not helped much.  Pertinent review of systems: No fevers or chills  Relevant historical information: Hypertension.  History of DVT.  Follicular lymphoma.  Osteoporosis.   Exam:  BP 130/82   Pulse 87   Ht 5' 1.75" (1.568 m)   Wt 108 lb (49 kg)   SpO2 94%   BMI 19.91 kg/m  General: Well Developed, well nourished, and in no acute distress.   MSK: L-spine nontender to palpation midline.  Tender palpation mid sacrum bony prominence. Tender palpation SI joint left side. Normal hip and low back motion.  Lower extremity strength is intact.    Lab and Radiology Results  X-ray images left hip obtained today personally and independently interpreted Mild lower lumbar spine degenerative changes.  No acute fractures are visible. Await formal radiology review     Assessment and Plan: 67 y.o. female with chronic buttocks and low back and sacrum pain.  Pain be due to pressure overlying the bony prominence of the mid sacrum and SI dysfunction and hip and core muscle weakness.  Plan on off loader cushion and physical therapy referral to work on strengthening.  Recheck in 6 weeks or return sooner if needed.  Consider advanced imaging or potentially SI injection if not better.  We talked about medication management.  Avoid high-dose oral NSAIDs given age. PDMP not reviewed this encounter. Orders Placed This Encounter   Procedures   DG HIP UNILAT W OR W/O PELVIS 2-3 VIEWS LEFT    Standing Status:   Future    Number of Occurrences:   1    Standing Expiration Date:   10/03/2023    Order Specific Question:   Reason for Exam (SYMPTOM  OR DIAGNOSIS REQUIRED)    Answer:   left hip pain    Order Specific Question:   Preferred imaging location?    Answer:   [facility]   Ambulatory referral to Physical Therapy    Referral Priority:   Routine    Referral Type:   Physical Medicine    Referral Reason:   Specialty Services Required    Requested Specialty:   Physical Therapy    Number of Visits Requested:   1   No orders of the defined types were placed in this encounter.    Discussed warning signs or symptoms. Please see discharge instructions. Patient expresses understanding.   The above documentation has been reviewed and is accurate and complete Clementeen Graham, M.D.

## 2023-09-10 NOTE — Therapy (Signed)
OUTPATIENT PHYSICAL THERAPY THORACOLUMBAR EVALUATION   Patient Name: Breanna Kirby MRN: [id number] DOB:12/29/1955,67 y.o., female Today's Date: 09/11/2023   END OF SESSION:  PT End of Session - 09/11/23 0755     Visit Number 1    Number of Visits 17    Date for PT Re-Evaluation 11/09/23    Authorization Type BCBS MCR    Progress Note Due on Visit 10    PT Start Time 0755    PT Stop Time 0839    PT Time Calculation (min) 44 min    Activity Tolerance Patient tolerated treatment well    Behavior During Therapy [facility] for tasks assessed/performed              Past Medical History:  Diagnosis Date   Lymphoma [facility])    Past Surgical History:  Procedure Laterality Date   CESAREAN SECTION     Patient Active Problem List   Diagnosis Date Noted   Osteoporosis 11/19/2022   Acute deep vein thrombosis (DVT) of femoral vein (HCC) 12/19/2017   Fatigue 12/03/2017   Essential hypertension 03/14/2016   Follicular lymphoma grade II of lymph nodes of multiple sites (HCC) 04/06/2015   Vitiligo 01/22/2012    PCP: Sigmund Hazel, MD  REFERRING PROVIDER: Rodolph Bong, MD   REFERRING DIAG:  [phone] (ICD-10-CM) - Left hip pain  M54.50,G89.29 (ICD-10-CM) - Chronic low back pain, unspecified back pain laterality, unspecified whether sciatica present    Rationale for Evaluation and Treatment: Rehabilitation  THERAPY DIAG:  Other low back pain  Muscle weakness (generalized)  ONSET DATE: September 2024  SUBJECTIVE:                                                                                                                                                                                           SUBJECTIVE STATEMENT: Patient reports in September she fell from stepping on a rotten piece of wood outside her home. She fell on her left side during the fall and remembers having generalized pain,but nothing specific to the left side. She saw Dr. Denyse Amass and was given gabapentin after  this fall, but didn't take much of this. She rested after the fall, but over the past month she has had more localized pain to the left low back/hip. She went back to see Dr. Denyse Amass and was encouraged to begin PT. She is very active, but is still getting pain with her workouts. Tried to do water aerobics, but could still feel pain in the back. She did yoga yesterday and that seemed to be helpful in reducing her pain. She wants to get back into her previous workout  regimen which includes weight lifting three times weekly. She has not experienced any recent numbness/tingling, but was having occasional tingling about the top of the Lt foot. Patient denies any changes in bowel/bladder.   PERTINENT HISTORY:  Lymphoma- currently being monitored, no active treatment  Osteoporosis   PAIN:  Are you having pain? Yes: NPRS scale: 1; at worst 5/10 Pain location: low back/sacrum/posterior Lt hip Pain description: ache Aggravating factors: lifting, first walking Relieving factors: yoga, increased walking  PRECAUTIONS: Other: lymphoma  WEIGHT BEARING RESTRICTIONS: No  FALLS:  Has patient fallen in last 6 months? Yes. Number of falls 1 was redoing deck and stepped on rotten wood and fell  LIVING ENVIRONMENT: Lives with: lives with their spouse Lives in: House/apartment Stairs: Yes: Internal: flight steps; on left going up Has following equipment at home: None  OCCUPATION: retired   PLOF: Independent  PATIENT GOALS: "good exercises I can fall back on at home and get back into working out."    OBJECTIVE:  Note: Objective measures were completed at Evaluation unless otherwise noted.  DIAGNOSTIC FINDINGS:  Lumbar X-ray: IMPRESSION: 1. No fracture or subluxation of the lumbar spine. 2. Mild degenerative disc disease at L3-L4 and L5-S1. Lower lumbar facet hypertrophy.  PATIENT SURVEYS:  FOTO 65% function to 72% predicted   SCREENING FOR RED FLAGS: Bowel or bladder incontinence: No Cauda  equina syndrome: No   COGNITION: Overall cognitive status: Within functional limits for tasks assessed     SENSATION: Not tested  MUSCLE LENGTH: Hamstrings: WNL bilaterally   POSTURE: decreased lumbar lordosis, prominent coccyx/sacrum   PALPATION: TTP bilateral SIJ, posterior ilium, PSIS, Lt gluteals   LUMBAR ROM:   AROM eval  Flexion WFL  Extension 25% limited  Right lateral flexion WFL  Left lateral flexion WFL  Right rotation 25% limited  Left rotation 25% limited    (Blank rows = not tested)  LOWER EXTREMITY ROM:     Active  Right eval Left eval  Hip flexion Full  Full   Hip extension    Hip abduction    Hip adduction    Hip internal rotation    Hip external rotation    Knee flexion    Knee extension    Ankle dorsiflexion    Ankle plantarflexion    Ankle inversion    Ankle eversion     (Blank rows = not tested)  LOWER EXTREMITY MMT:    MMT Right eval Left eval  Hip flexion 4- 4-  Hip extension 4- 4-  Hip abduction 4 4  Hip adduction    Hip internal rotation    Hip external rotation    Knee flexion 5 5  Knee extension 5 5  Ankle dorsiflexion 5 5  Ankle plantarflexion 5 5  Ankle inversion    Ankle eversion 5 5   (Blank rows = not tested)  LUMBAR SPECIAL TESTS:  (-) SLR, FABER, Sacral Thrust (+) Thigh thrust   FUNCTIONAL TESTS:  Squat: limited knee flexion.  SLS: 20 seconds each   GAIT: Distance walked: 10 ft  Assistive device utilized: None Level of assistance: Complete Independence Comments: WNL  [facility] Adult PT Treatment:                                                DATE: 09/11/23  Therapeutic Exercise: Demonstrated and issued initial HEP.  PATIENT EDUCATION:  Education details: see treatment; POC; assessment findings  Person educated: Patient Education method: Explanation, Demonstration, Tactile cues, Verbal cues, and Handouts Education comprehension: verbalized understanding, returned demonstration, verbal cues  required, tactile cues required, and needs further education  HOME EXERCISE PROGRAM: Access Code: [email or web address] URL: [email or web address] Date: 09/11/2023 Prepared by: Letitia Libra  Exercises - Prone Hip Extension  - 1 x daily - 7 x weekly - 2 sets - 10 reps - Sidelying Hip Abduction  - 1 x daily - 7 x weekly - 2 sets - 10 reps - Cat Cow  - 1 x daily - 7 x weekly - 1 sets - 10 reps - Sidelying Thoracic Rotation with Open Book  - 1 x daily - 7 x weekly - 1 sets - 10 reps  ASSESSMENT:  CLINICAL IMPRESSION: Patient is a 67 y.o. female who was seen today for physical therapy evaluation and treatment for chronic low back/posterior hip pain that has been ongoing since a fall in September with recent worsening over the past month. Upon assessment she is noted to have slight limitation into trunk extension and rotation, but pain free ROM in all planes. She exhibits core and gluteal weakness and aberrant squatting mechanics. She will benefit from skilled PT to address the above stated deficits in order to optimize her function and assist in overall pain reduction.   OBJECTIVE IMPAIRMENTS: decreased activity tolerance, decreased knowledge of condition, difficulty walking, decreased ROM, decreased strength, improper body mechanics, postural dysfunction, and pain.   ACTIVITY LIMITATIONS: lifting, bending, squatting, and locomotion level  PARTICIPATION LIMITATIONS:  recreation  PERSONAL FACTORS: Age, Time since onset of injury/illness/exacerbation, and 1-2 comorbidities: osteoporosis, lymphoma  are also affecting patient's functional outcome.   REHAB POTENTIAL: Good  CLINICAL DECISION MAKING: Stable/uncomplicated  EVALUATION COMPLEXITY: Low   GOALS: Goals reviewed with patient? Yes  SHORT TERM GOALS: Target date: 10/09/2023   Patient will be independent and compliant with initial HEP.   Baseline: see above Goal status: INITIAL  2.  Patient will demonstrate proper squat  mechanics to reduce stress on her back with bending/lifting activity.  Baseline: see above  Goal status: INITIAL   LONG TERM GOALS: Target date: 11/09/23  Patient will score at least 72% on FOTO to signify clinically meaningful improvement in functional abilities.   Baseline: see above Goal status: INITIAL  2.  Patient will demonstrate 5/5 bilateral hip strength to improve overall lumbopelvic stability.  Baseline: see above Goal status: INITIAL  3.  Patient will report pain at worst rated as </=2/10 to reduce current functional limitations.  Baseline: 5 Goal status: INITIAL  4.  Patient will return to previous workout regimen without limitations as it relates to her back.  Baseline: not completing weightlifting  Goal status: INITIAL   PLAN:  PT FREQUENCY: 1-2x/week  PT DURATION: 8 weeks  PLANNED INTERVENTIONS: 97164- PT Re-evaluation, 97110-Therapeutic exercises, 97530- Therapeutic activity, [id number]- Neuromuscular re-education, 97535- Self Care, 29562- Manual therapy, [id number]- Aquatic Therapy, [id number]- Traction (mechanical), and Cryotherapy.   PLAN FOR NEXT SESSION: review and progress HEP prn; trunk mobility; hip and core strengthening. no e-stim/heat due to lymphoma; supine exercises not comfortable due to prominent coccyx/sacrum   Letitia Libra, PT, DPT, ATC 09/11/23 4:00 PM

## 2023-09-11 ENCOUNTER — Ambulatory Visit: Payer: Medicare Other | Attending: Family Medicine

## 2023-09-11 ENCOUNTER — Other Ambulatory Visit: Payer: Self-pay

## 2023-09-11 DIAGNOSIS — G8929 Other chronic pain: Secondary | ICD-10-CM | POA: Diagnosis not present

## 2023-09-11 DIAGNOSIS — M5459 Other low back pain: Secondary | ICD-10-CM | POA: Insufficient documentation

## 2023-09-11 DIAGNOSIS — M6281 Muscle weakness (generalized): Secondary | ICD-10-CM | POA: Diagnosis not present

## 2023-09-11 DIAGNOSIS — M545 Low back pain, unspecified: Secondary | ICD-10-CM | POA: Diagnosis not present

## 2023-09-11 DIAGNOSIS — M25552 Pain in left hip: Secondary | ICD-10-CM | POA: Insufficient documentation

## 2023-09-19 ENCOUNTER — Ambulatory Visit: Payer: Medicare Other

## 2023-09-19 DIAGNOSIS — M6281 Muscle weakness (generalized): Secondary | ICD-10-CM

## 2023-09-19 DIAGNOSIS — M25552 Pain in left hip: Secondary | ICD-10-CM | POA: Diagnosis not present

## 2023-09-19 DIAGNOSIS — M545 Low back pain, unspecified: Secondary | ICD-10-CM | POA: Diagnosis not present

## 2023-09-19 DIAGNOSIS — G8929 Other chronic pain: Secondary | ICD-10-CM | POA: Diagnosis not present

## 2023-09-19 DIAGNOSIS — M5459 Other low back pain: Secondary | ICD-10-CM

## 2023-09-19 NOTE — Therapy (Signed)
OUTPATIENT PHYSICAL THERAPY THORACOLUMBAR TREATMENT   Patient Name: Breanna Kirby MRN: [id number] DOB:1956/05/07,67 y.o., female Today's Date: 09/19/2023   END OF SESSION:  PT End of Session - 09/19/23 1618     Visit Number 2    Number of Visits 17    Date for PT Re-Evaluation 11/09/23    Authorization Type BCBS MCR    Progress Note Due on Visit 10    PT Start Time 1617    PT Stop Time 1656    PT Time Calculation (min) 39 min    Activity Tolerance Patient tolerated treatment well    Behavior During Therapy [facility] for tasks assessed/performed               Past Medical History:  Diagnosis Date   Lymphoma (HCC)    Past Surgical History:  Procedure Laterality Date   CESAREAN SECTION     Patient Active Problem List   Diagnosis Date Noted   Osteoporosis 11/19/2022   Acute deep vein thrombosis (DVT) of femoral vein (HCC) 12/19/2017   Fatigue 12/03/2017   Essential hypertension 03/14/2016   Follicular lymphoma grade II of lymph nodes of multiple sites (HCC) 04/06/2015   Vitiligo 01/22/2012    PCP: Sigmund Hazel, MD  REFERRING PROVIDER: Rodolph Bong, MD   REFERRING DIAG:  [phone] (ICD-10-CM) - Left hip pain  M54.50,G89.29 (ICD-10-CM) - Chronic low back pain, unspecified back pain laterality, unspecified whether sciatica present    Rationale for Evaluation and Treatment: Rehabilitation  THERAPY DIAG:  Other low back pain  Muscle weakness (generalized)  ONSET DATE: September 2024  SUBJECTIVE:                                                                                                                                                                                           SUBJECTIVE STATEMENT: Patient was hurting a Monty bit after her evaluation. Has had a couple random bouts of discomfort about the low back.   EVAL: Patient reports in September she fell from stepping on a rotten piece of wood outside her home. She fell on her left side during the fall  and remembers having generalized pain,but nothing specific to the left side. She saw Dr. Denyse Amass and was given gabapentin after this fall, but didn't take much of this. She rested after the fall, but over the past month she has had more localized pain to the left low back/hip. She went back to see Dr. Denyse Amass and was encouraged to begin PT. She is very active, but is still getting pain with her workouts. Tried to do water aerobics, but could still feel pain in  the back. She did yoga yesterday and that seemed to be helpful in reducing her pain. She wants to get back into her previous workout regimen which includes weight lifting three times weekly. She has not experienced any recent numbness/tingling, but was having occasional tingling about the top of the Lt foot. Patient denies any changes in bowel/bladder.   PERTINENT HISTORY:  Lymphoma- currently being monitored, no active treatment  Osteoporosis   PAIN:  Are you having pain? Yes: NPRS scale: 0 currently; at worst 4/10 Pain location: low back/sacrum/posterior Lt hip Pain description: ache Aggravating factors: lifting, first walking Relieving factors: yoga, increased walking  PRECAUTIONS: Other: lymphoma  WEIGHT BEARING RESTRICTIONS: No  FALLS:  Has patient fallen in last 6 months? Yes. Number of falls 1 was redoing deck and stepped on rotten wood and fell  LIVING ENVIRONMENT: Lives with: lives with their spouse Lives in: House/apartment Stairs: Yes: Internal: flight steps; on left going up Has following equipment at home: None  OCCUPATION: retired   PLOF: Independent  PATIENT GOALS: "good exercises I can fall back on at home and get back into working out."    OBJECTIVE:  Note: Objective measures were completed at Evaluation unless otherwise noted.  DIAGNOSTIC FINDINGS:  Lumbar X-ray: IMPRESSION: 1. No fracture or subluxation of the lumbar spine. 2. Mild degenerative disc disease at L3-L4 and L5-S1. Lower lumbar facet  hypertrophy.  PATIENT SURVEYS:  FOTO 65% function to 72% predicted   SCREENING FOR RED FLAGS: Bowel or bladder incontinence: No Cauda equina syndrome: No   COGNITION: Overall cognitive status: Within functional limits for tasks assessed     SENSATION: Not tested  MUSCLE LENGTH: Hamstrings: WNL bilaterally   POSTURE: decreased lumbar lordosis, prominent coccyx/sacrum   PALPATION: TTP bilateral SIJ, posterior ilium, PSIS, Lt gluteals   LUMBAR ROM:   AROM eval  Flexion WFL  Extension 25% limited  Right lateral flexion WFL  Left lateral flexion WFL  Right rotation 25% limited  Left rotation 25% limited    (Blank rows = not tested)  LOWER EXTREMITY ROM:     Active  Right eval Left eval  Hip flexion Full  Full   Hip extension    Hip abduction    Hip adduction    Hip internal rotation    Hip external rotation    Knee flexion    Knee extension    Ankle dorsiflexion    Ankle plantarflexion    Ankle inversion    Ankle eversion     (Blank rows = not tested)  LOWER EXTREMITY MMT:    MMT Right eval Left eval  Hip flexion 4- 4-  Hip extension 4- 4-  Hip abduction 4 4  Hip adduction    Hip internal rotation    Hip external rotation    Knee flexion 5 5  Knee extension 5 5  Ankle dorsiflexion 5 5  Ankle plantarflexion 5 5  Ankle inversion    Ankle eversion 5 5   (Blank rows = not tested)  LUMBAR SPECIAL TESTS:  (-) SLR, FABER, Sacral Thrust (+) Thigh thrust   FUNCTIONAL TESTS:  Squat: limited knee flexion.  SLS: 20 seconds each   GAIT: Distance walked: 10 ft  Assistive device utilized: None Level of assistance: Complete Independence Comments: WNL [facility] Adult PT Treatment:  DATE: 09/19/23 Therapeutic Exercise: Cat/cow x 10; gentle ROM Open book x 10  Prone hip extension 2 x 10  Sidelying hip abduction 2 x 10  Seated figure 4 stretch x 30 sec each  Seated pelvic tilts 2 x 10  Seated TA march 2 x 10   Clamshells 2 x 10; blue band  Updated HEP    [facility] Adult PT Treatment:                                                DATE: 09/11/23  Therapeutic Exercise: Demonstrated and issued initial HEP.      PATIENT EDUCATION:  Education details: HEP update  Person educated: Patient Education method: Explanation, Demonstration, Tactile cues, Verbal cues, and Handouts Education comprehension: verbalized understanding, returned demonstration, verbal cues required, tactile cues required, and needs further education  HOME EXERCISE PROGRAM: Access Code: [email or web address] URL: [email or web address] Date: 09/19/2023 Prepared by: Letitia Libra  Exercises - Prone Hip Extension  - 1 x daily - 7 x weekly - 2 sets - 10 reps - Sidelying Hip Abduction  - 1 x daily - 7 x weekly - 2 sets - 10 reps - Cat Cow  - 1 x daily - 7 x weekly - 1 sets - 10 reps - Sidelying Thoracic Rotation with Open Book  - 1 x daily - 7 x weekly - 1 sets - 10 reps - Seated Pelvic Tilt  - 1 x daily - 7 x weekly - 2 sets - 10 reps - Clamshell with Resistance  - 1 x daily - 7 x weekly - 2 sets - 10 reps  ASSESSMENT:  CLINICAL IMPRESSION: Patient arrives without reports of back pain. Reviewed initial HEP with patient requiring cues for setup of sidelying hip abduction as she has tendency to roll the hip forward. With seated marching she has slight lateral trunk lean present with lifting her RLE that she is able to correct with cueing. No reports of pain throughout session.   EVAL: Patient is a 67 y.o. female who was seen today for physical therapy evaluation and treatment for chronic low back/posterior hip pain that has been ongoing since a fall in September with recent worsening over the past month. Upon assessment she is noted to have slight limitation into trunk extension and rotation, but pain free ROM in all planes. She exhibits core and gluteal weakness and aberrant squatting mechanics. She will benefit from skilled PT to  address the above stated deficits in order to optimize her function and assist in overall pain reduction.   OBJECTIVE IMPAIRMENTS: decreased activity tolerance, decreased knowledge of condition, difficulty walking, decreased ROM, decreased strength, improper body mechanics, postural dysfunction, and pain.   ACTIVITY LIMITATIONS: lifting, bending, squatting, and locomotion level  PARTICIPATION LIMITATIONS:  recreation  PERSONAL FACTORS: Age, Time since onset of injury/illness/exacerbation, and 1-2 comorbidities: osteoporosis, lymphoma  are also affecting patient's functional outcome.   REHAB POTENTIAL: Good  CLINICAL DECISION MAKING: Stable/uncomplicated  EVALUATION COMPLEXITY: Low   GOALS: Goals reviewed with patient? Yes  SHORT TERM GOALS: Target date: 10/09/2023   Patient will be independent and compliant with initial HEP.   Baseline: see above Goal status: INITIAL  2.  Patient will demonstrate proper squat mechanics to reduce stress on her back with bending/lifting activity.  Baseline: see above  Goal status: INITIAL   LONG TERM GOALS: Target date:  11/09/23  Patient will score at least 72% on FOTO to signify clinically meaningful improvement in functional abilities.   Baseline: see above Goal status: INITIAL  2.  Patient will demonstrate 5/5 bilateral hip strength to improve overall lumbopelvic stability.  Baseline: see above Goal status: INITIAL  3.  Patient will report pain at worst rated as </=2/10 to reduce current functional limitations.  Baseline: 5 Goal status: INITIAL  4.  Patient will return to previous workout regimen without limitations as it relates to her back.  Baseline: not completing weightlifting  Goal status: INITIAL   PLAN:  PT FREQUENCY: 1-2x/week  PT DURATION: 8 weeks  PLANNED INTERVENTIONS: 97164- PT Re-evaluation, 97110-Therapeutic exercises, 97530- Therapeutic activity, [id number]- Neuromuscular re-education, 97535- Self Care, 16109-  Manual therapy, [id number]- Aquatic Therapy, [id number]- Traction (mechanical), and Cryotherapy.   PLAN FOR NEXT SESSION: review and progress HEP prn; trunk mobility; hip and core strengthening. no e-stim/heat due to lymphoma; supine exercises not comfortable due to prominent coccyx/sacrum; lifting mechanics   Letitia Libra, PT, DPT, ATC 09/19/23 4:56 PM

## 2023-09-23 ENCOUNTER — Ambulatory Visit: Payer: Medicare Other

## 2023-09-23 DIAGNOSIS — M6281 Muscle weakness (generalized): Secondary | ICD-10-CM | POA: Diagnosis not present

## 2023-09-23 DIAGNOSIS — G8929 Other chronic pain: Secondary | ICD-10-CM | POA: Diagnosis not present

## 2023-09-23 DIAGNOSIS — M5459 Other low back pain: Secondary | ICD-10-CM

## 2023-09-23 DIAGNOSIS — M25552 Pain in left hip: Secondary | ICD-10-CM | POA: Diagnosis not present

## 2023-09-23 DIAGNOSIS — M545 Low back pain, unspecified: Secondary | ICD-10-CM | POA: Diagnosis not present

## 2023-09-23 NOTE — Therapy (Signed)
OUTPATIENT PHYSICAL THERAPY THORACOLUMBAR TREATMENT   Patient Name: Breanna Kirby MRN: [id number] DOB:03-22-1956,67 y.o., female Today's Date: 09/23/2023   END OF SESSION:  PT End of Session - 09/23/23 1408     Visit Number 3    Number of Visits 17    Date for PT Re-Evaluation 11/09/23    Authorization Type BCBS MCR    Progress Note Due on Visit 10    PT Start Time 1409   patient late   PT Stop Time 1444    PT Time Calculation (min) 35 min    Activity Tolerance Patient tolerated treatment well    Behavior During Therapy [facility] for tasks assessed/performed                Past Medical History:  Diagnosis Date   Lymphoma (HCC)    Past Surgical History:  Procedure Laterality Date   CESAREAN SECTION     Patient Active Problem List   Diagnosis Date Noted   Osteoporosis 11/19/2022   Acute deep vein thrombosis (DVT) of femoral vein (HCC) 12/19/2017   Fatigue 12/03/2017   Essential hypertension 03/14/2016   Follicular lymphoma grade II of lymph nodes of multiple sites (HCC) 04/06/2015   Vitiligo 01/22/2012    PCP: Sigmund Hazel, MD  REFERRING PROVIDER: Rodolph Bong, MD   REFERRING DIAG:  [phone] (ICD-10-CM) - Left hip pain  M54.50,G89.29 (ICD-10-CM) - Chronic low back pain, unspecified back pain laterality, unspecified whether sciatica present    Rationale for Evaluation and Treatment: Rehabilitation  THERAPY DIAG:  Other low back pain  Muscle weakness (generalized)  ONSET DATE: September 2024  SUBJECTIVE:                                                                                                                                                                                           SUBJECTIVE STATEMENT: Patient used the elliptical at the gym today and this was ok. She feels sore from the gym, but not really pain.   EVAL: Patient reports in September she fell from stepping on a rotten piece of wood outside her home. She fell on her left side during  the fall and remembers having generalized pain,but nothing specific to the left side. She saw Dr. Denyse Amass and was given gabapentin after this fall, but didn't take much of this. She rested after the fall, but over the past month she has had more localized pain to the left low back/hip. She went back to see Dr. Denyse Amass and was encouraged to begin PT. She is very active, but is still getting pain with her workouts. Tried to do water aerobics, but  could still feel pain in the back. She did yoga yesterday and that seemed to be helpful in reducing her pain. She wants to get back into her previous workout regimen which includes weight lifting three times weekly. She has not experienced any recent numbness/tingling, but was having occasional tingling about the top of the Lt foot. Patient denies any changes in bowel/bladder.   PERTINENT HISTORY:  Lymphoma- currently being monitored, no active treatment  Osteoporosis   PAIN:  Are you having pain? Yes: NPRS scale: 0 currently; at worst 3-4/10 Pain location: low back/sacrum/posterior Lt hip Pain description: ache Aggravating factors: lifting, first walking Relieving factors: yoga, increased walking  PRECAUTIONS: Other: lymphoma  WEIGHT BEARING RESTRICTIONS: No  FALLS:  Has patient fallen in last 6 months? Yes. Number of falls 1 was redoing deck and stepped on rotten wood and fell  LIVING ENVIRONMENT: Lives with: lives with their spouse Lives in: House/apartment Stairs: Yes: Internal: flight steps; on left going up Has following equipment at home: None  OCCUPATION: retired   PLOF: Independent  PATIENT GOALS: "good exercises I can fall back on at home and get back into working out."    OBJECTIVE:  Note: Objective measures were completed at Evaluation unless otherwise noted.  DIAGNOSTIC FINDINGS:  Lumbar X-ray: IMPRESSION: 1. No fracture or subluxation of the lumbar spine. 2. Mild degenerative disc disease at L3-L4 and L5-S1. Lower  lumbar facet hypertrophy.  PATIENT SURVEYS:  FOTO 65% function to 72% predicted   SCREENING FOR RED FLAGS: Bowel or bladder incontinence: No Cauda equina syndrome: No   COGNITION: Overall cognitive status: Within functional limits for tasks assessed     SENSATION: Not tested  MUSCLE LENGTH: Hamstrings: WNL bilaterally   POSTURE: decreased lumbar lordosis, prominent coccyx/sacrum   PALPATION: TTP bilateral SIJ, posterior ilium, PSIS, Lt gluteals   LUMBAR ROM:   AROM eval  Flexion WFL  Extension 25% limited  Right lateral flexion WFL  Left lateral flexion WFL  Right rotation 25% limited  Left rotation 25% limited    (Blank rows = not tested)  LOWER EXTREMITY ROM:     Active  Right eval Left eval  Hip flexion Full  Full   Hip extension    Hip abduction    Hip adduction    Hip internal rotation    Hip external rotation    Knee flexion    Knee extension    Ankle dorsiflexion    Ankle plantarflexion    Ankle inversion    Ankle eversion     (Blank rows = not tested)  LOWER EXTREMITY MMT:    MMT Right eval Left eval  Hip flexion 4- 4-  Hip extension 4- 4-  Hip abduction 4 4  Hip adduction    Hip internal rotation    Hip external rotation    Knee flexion 5 5  Knee extension 5 5  Ankle dorsiflexion 5 5  Ankle plantarflexion 5 5  Ankle inversion    Ankle eversion 5 5   (Blank rows = not tested)  LUMBAR SPECIAL TESTS:  (-) SLR, FABER, Sacral Thrust (+) Thigh thrust   FUNCTIONAL TESTS:  Squat: limited knee flexion.  SLS: 20 seconds each   GAIT: Distance walked: 10 ft  Assistive device utilized: None Level of assistance: Complete Independence Comments: WNL [facility] Adult PT Treatment:  DATE: 09/23/23 Therapeutic Exercise: Seated hip hinge with dowel x 10  Standing hip hinge with dowel x 10  Hip hinge with 5 lb kettlebell to 6 inch step x 10  Clamshells 2 x 10 blue band  Leg press 2 x 10 @ 65 lbs   Updated HEP   Self Care: Discussed appropriate gym equipment to utilize currently    [facility] Adult PT Treatment:                                                DATE: 09/19/23 Therapeutic Exercise: Cat/cow x 10; gentle ROM Open book x 10  Prone hip extension 2 x 10  Sidelying hip abduction 2 x 10  Seated figure 4 stretch x 30 sec each  Seated pelvic tilts 2 x 10  Seated TA march 2 x 10  Clamshells 2 x 10; blue band  Updated HEP    [facility] Adult PT Treatment:                                                DATE: 09/11/23  Therapeutic Exercise: Demonstrated and issued initial HEP.      PATIENT EDUCATION:  Education details: HEP update  Person educated: Patient Education method: Explanation, Demonstration, Tactile cues, Verbal cues, and Handouts Education comprehension: verbalized understanding, returned demonstration, verbal cues required, tactile cues required, and needs further education  HOME EXERCISE PROGRAM: Access Code: [email or web address] URL: [email or web address] Date: 09/23/2023 Prepared by: Letitia Libra  Exercises - Prone Hip Extension  - 1 x daily - 7 x weekly - 2 sets - 10 reps - Sidelying Hip Abduction  - 1 x daily - 7 x weekly - 2 sets - 10 reps - Cat Cow  - 1 x daily - 7 x weekly - 1 sets - 10 reps - Sidelying Thoracic Rotation with Open Book  - 1 x daily - 7 x weekly - 1 sets - 10 reps - Seated Pelvic Tilt  - 1 x daily - 7 x weekly - 2 sets - 10 reps - Clamshell with Resistance  - 1 x daily - 7 x weekly - 2 sets - 10 reps - Standing Hip Hinge with Dowel  - 1 x daily - 7 x weekly - 2 sets - 10 reps  ASSESSMENT:  CLINICAL IMPRESSION: Worked on bending mechanics today focusing on hip hinge with good tolerance. She is able to demonstrate proper hip hinge with dowel in seated and standing without onset of pain. Able to lift light weight with proper form utilizing hip hinge technique and no pain.   EVAL: Patient is a 67 y.o. female who was seen today for  physical therapy evaluation and treatment for chronic low back/posterior hip pain that has been ongoing since a fall in September with recent worsening over the past month. Upon assessment she is noted to have slight limitation into trunk extension and rotation, but pain free ROM in all planes. She exhibits core and gluteal weakness and aberrant squatting mechanics. She will benefit from skilled PT to address the above stated deficits in order to optimize her function and assist in overall pain reduction.   OBJECTIVE IMPAIRMENTS: decreased activity tolerance, decreased knowledge of condition, difficulty walking, decreased ROM, decreased  strength, improper body mechanics, postural dysfunction, and pain.   ACTIVITY LIMITATIONS: lifting, bending, squatting, and locomotion level  PARTICIPATION LIMITATIONS:  recreation  PERSONAL FACTORS: Age, Time since onset of injury/illness/exacerbation, and 1-2 comorbidities: osteoporosis, lymphoma  are also affecting patient's functional outcome.   REHAB POTENTIAL: Good  CLINICAL DECISION MAKING: Stable/uncomplicated  EVALUATION COMPLEXITY: Low   GOALS: Goals reviewed with patient? Yes  SHORT TERM GOALS: Target date: 10/09/2023   Patient will be independent and compliant with initial HEP.   Baseline: see above Goal status: MET  2.  Patient will demonstrate proper squat mechanics to reduce stress on her back with bending/lifting activity.  Baseline: see above  Goal status: INITIAL   LONG TERM GOALS: Target date: 11/09/23  Patient will score at least 72% on FOTO to signify clinically meaningful improvement in functional abilities.   Baseline: see above Goal status: INITIAL  2.  Patient will demonstrate 5/5 bilateral hip strength to improve overall lumbopelvic stability.  Baseline: see above Goal status: INITIAL  3.  Patient will report pain at worst rated as </=2/10 to reduce current functional limitations.  Baseline: 5 Goal status:  INITIAL  4.  Patient will return to previous workout regimen without limitations as it relates to her back.  Baseline: not completing weightlifting  Goal status: INITIAL   PLAN:  PT FREQUENCY: 1-2x/week  PT DURATION: 8 weeks  PLANNED INTERVENTIONS: 97164- PT Re-evaluation, 97110-Therapeutic exercises, 97530- Therapeutic activity, [id number]- Neuromuscular re-education, 97535- Self Care, 82956- Manual therapy, [id number]- Aquatic Therapy, [id number]- Traction (mechanical), and Cryotherapy.   PLAN FOR NEXT SESSION: review and progress HEP prn; trunk mobility; hip and core strengthening. no e-stim/heat due to lymphoma; supine exercises not comfortable due to prominent coccyx/sacrum; lifting mechanics   Letitia Libra, PT, DPT, ATC 09/23/23 2:45 PM

## 2023-09-23 NOTE — Progress Notes (Signed)
Left hip x-ray did take use of both hips and they both have arthritis

## 2023-09-26 ENCOUNTER — Ambulatory Visit: Payer: Medicare Other | Attending: Family Medicine

## 2023-09-26 DIAGNOSIS — M5459 Other low back pain: Secondary | ICD-10-CM | POA: Diagnosis not present

## 2023-09-26 DIAGNOSIS — M6281 Muscle weakness (generalized): Secondary | ICD-10-CM | POA: Diagnosis not present

## 2023-09-26 NOTE — Therapy (Signed)
 OUTPATIENT PHYSICAL THERAPY THORACOLUMBAR TREATMENT   Patient Name: Breanna Kirby MRN: [id number] DOB:1956-09-01,68 y.o., female Today's Date: 09/26/2023   END OF SESSION:  PT End of Session - 09/26/23 1403     Visit Number 4    Number of Visits 17    Date for PT Re-Evaluation 11/09/23    Authorization Type BCBS MCR    Progress Note Due on Visit 10    PT Start Time 1402    PT Stop Time 1443    PT Time Calculation (min) 41 min    Activity Tolerance Patient tolerated treatment well    Behavior During Therapy [facility] for tasks assessed/performed                 Past Medical History:  Diagnosis Date   Lymphoma (HCC)    Past Surgical History:  Procedure Laterality Date   CESAREAN SECTION     Patient Active Problem List   Diagnosis Date Noted   Osteoporosis 11/19/2022   Acute deep vein thrombosis (DVT) of femoral vein (HCC) 12/19/2017   Fatigue 12/03/2017   Essential hypertension 03/14/2016   Follicular lymphoma grade II of lymph nodes of multiple sites (HCC) 04/06/2015   Vitiligo 01/22/2012    PCP: Cleotilde Planas, MD  REFERRING PROVIDER: Joane Artist RAMAN, MD   REFERRING DIAG:  [phone] (ICD-10-CM) - Left hip pain  M54.50,G89.29 (ICD-10-CM) - Chronic low back pain, unspecified back pain laterality, unspecified whether sciatica present    Rationale for Evaluation and Treatment: Rehabilitation  THERAPY DIAG:  Other low back pain  Muscle weakness (generalized)  ONSET DATE: September 2024  SUBJECTIVE:                                                                                                                                                                                           SUBJECTIVE STATEMENT: Patient reports the back is feeling good without pain currently. Had some pain last night, but otherwise has been feeling good.   EVAL: Patient reports in September she fell from stepping on a rotten piece of wood outside her home. She fell on her left side  during the fall and remembers having generalized pain,but nothing specific to the left side. She saw Dr. Joane and was given gabapentin  after this fall, but didn't take much of this. She rested after the fall, but over the past month she has had more localized pain to the left low back/hip. She went back to see Dr. Joane and was encouraged to begin PT. She is very active, but is still getting pain with her workouts. Tried to do water aerobics, but could still feel  pain in the back. She did yoga yesterday and that seemed to be helpful in reducing her pain. She wants to get back into her previous workout regimen which includes weight lifting three times weekly. She has not experienced any recent numbness/tingling, but was having occasional tingling about the top of the Lt foot. Patient denies any changes in bowel/bladder.   PERTINENT HISTORY:  Lymphoma- currently being monitored, no active treatment  Osteoporosis   PAIN:  Are you having pain? Yes: NPRS scale: 0 currently; at worst 3-4/10 Pain location: low back/sacrum/posterior Lt hip Pain description: ache Aggravating factors: lifting, first walking Relieving factors: yoga, increased walking  PRECAUTIONS: Other: lymphoma  WEIGHT BEARING RESTRICTIONS: No  FALLS:  Has patient fallen in last 6 months? Yes. Number of falls 1 was redoing deck and stepped on rotten wood and fell  LIVING ENVIRONMENT: Lives with: lives with their spouse Lives in: House/apartment Stairs: Yes: Internal: flight steps; on left going up Has following equipment at home: None  OCCUPATION: retired   PLOF: Independent  PATIENT GOALS: good exercises I can fall back on at home and get back into working out.    OBJECTIVE:  Note: Objective measures were completed at Evaluation unless otherwise noted.  DIAGNOSTIC FINDINGS:  Lumbar X-ray: IMPRESSION: 1. No fracture or subluxation of the lumbar spine. 2. Mild degenerative disc disease at L3-L4 and L5-S1. Lower  lumbar facet hypertrophy.  PATIENT SURVEYS:  FOTO 65% function to 72% predicted   SCREENING FOR RED FLAGS: Bowel or bladder incontinence: No Cauda equina syndrome: No   COGNITION: Overall cognitive status: Within functional limits for tasks assessed     SENSATION: Not tested  MUSCLE LENGTH: Hamstrings: WNL bilaterally   POSTURE: decreased lumbar lordosis, prominent coccyx/sacrum   PALPATION: TTP bilateral SIJ, posterior ilium, PSIS, Lt gluteals   LUMBAR ROM:   AROM eval  Flexion WFL  Extension 25% limited  Right lateral flexion WFL  Left lateral flexion WFL  Right rotation 25% limited  Left rotation 25% limited    (Blank rows = not tested)  LOWER EXTREMITY ROM:     Active  Right eval Left eval  Hip flexion Full  Full   Hip extension    Hip abduction    Hip adduction    Hip internal rotation    Hip external rotation    Knee flexion    Knee extension    Ankle dorsiflexion    Ankle plantarflexion    Ankle inversion    Ankle eversion     (Blank rows = not tested)  LOWER EXTREMITY MMT:    MMT Right eval Left eval 09/26/23  Hip flexion 4- 4- 4+ bilateral   Hip extension 4- 4-   Hip abduction 4 4   Hip adduction     Hip internal rotation     Hip external rotation     Knee flexion 5 5   Knee extension 5 5   Ankle dorsiflexion 5 5   Ankle plantarflexion 5 5   Ankle inversion     Ankle eversion 5 5    (Blank rows = not tested)  LUMBAR SPECIAL TESTS:  (-) SLR, FABER, Sacral Thrust (+) Thigh thrust   FUNCTIONAL TESTS:  Squat: limited knee flexion.  SLS: 20 seconds each   GAIT: Distance walked: 10 ft  Assistive device utilized: None Level of assistance: Complete Independence Comments: WNL [facility] Adult PT Treatment:  DATE: 09/26/23 Therapeutic Exercise: Elliptical level 1.5 x 5 minutes  Physioball wall squats 2 x 10  [place] press 2 x 10 blue band  Standing hip abduction green band 2 x 10  Standing  hip extension green band 2 x 10  Plank x 30 sec. Updated HEP   Self Care: Discussed gym equipment to utilize    [facility] Adult PT Treatment:                                                DATE: 09/23/23 Therapeutic Exercise: Seated hip hinge with dowel x 10  Standing hip hinge with dowel x 10  Hip hinge with 5 lb kettlebell to 6 inch step x 10  Clamshells 2 x 10 blue band  Leg press 2 x 10 @ 65 lbs  Updated HEP   Self Care: Discussed appropriate gym equipment to utilize currently    [facility] Adult PT Treatment:                                                DATE: 09/19/23 Therapeutic Exercise: Cat/cow x 10; gentle ROM Open book x 10  Prone hip extension 2 x 10  Sidelying hip abduction 2 x 10  Seated figure 4 stretch x 30 sec each  Seated pelvic tilts 2 x 10  Seated TA march 2 x 10  Clamshells 2 x 10; blue band  Updated HEP   PATIENT EDUCATION:  Education details: HEP update  Person educated: Patient Education method: Explanation, Demonstration, Tactile cues, Verbal cues, and Handouts Education comprehension: verbalized understanding, returned demonstration, verbal cues required, tactile cues required, and needs further education  HOME EXERCISE PROGRAM: Access Code: [email or web address] URL: [email or web address] Date: 09/26/2023 Prepared by: Lucie Meeter  Exercises - Prone Hip Extension  - 1 x daily - 7 x weekly - 2 sets - 10 reps - Sidelying Hip Abduction  - 1 x daily - 7 x weekly - 2 sets - 10 reps - Cat Cow  - 1 x daily - 7 x weekly - 1 sets - 10 reps - Sidelying Thoracic Rotation with Open Book  - 1 x daily - 7 x weekly - 1 sets - 10 reps - Seated Pelvic Tilt  - 1 x daily - 7 x weekly - 2 sets - 10 reps - Clamshell with Resistance  - 1 x daily - 7 x weekly - 2 sets - 10 reps - Standing Hip Hinge with Dowel  - 1 x daily - 7 x weekly - 2 sets - 10 reps - Standing Anti-Rotation Press with Anchored Resistance  - 1 x daily - 7 x weekly - 2 sets - 10 reps - Standing Hip  Abduction with Resistance at Ankles and Counter Support  - 1 x daily - 7 x weekly - 2 sets - 10 reps - Standing Hip Extension with Resistance at Ankles and Counter Support  - 1 x daily - 7 x weekly - 2 sets - 10 reps - Wall Squat with Swiss Ball  - 1 x daily - 3 x weekly - 2 sets - 10 reps  ASSESSMENT:  CLINICAL IMPRESSION: Patient arrives without reports of pain. Progressed standing strengthening focusing on core and hip strengthening with  good tolerance. Proper squat form noted with physioball wall squats. Minimal cues required to achieve proper plank form as she has slight lordotic posturing initially. No reports of pain throughout session.   EVAL: Patient is a 68 y.o. female who was seen today for physical therapy evaluation and treatment for chronic low back/posterior hip pain that has been ongoing since a fall in September with recent worsening over the past month. Upon assessment she is noted to have slight limitation into trunk extension and rotation, but pain free ROM in all planes. She exhibits core and gluteal weakness and aberrant squatting mechanics. She will benefit from skilled PT to address the above stated deficits in order to optimize her function and assist in overall pain reduction.   OBJECTIVE IMPAIRMENTS: decreased activity tolerance, decreased knowledge of condition, difficulty walking, decreased ROM, decreased strength, improper body mechanics, postural dysfunction, and pain.   ACTIVITY LIMITATIONS: lifting, bending, squatting, and locomotion level  PARTICIPATION LIMITATIONS:  recreation  PERSONAL FACTORS: Age, Time since onset of injury/illness/exacerbation, and 1-2 comorbidities: osteoporosis, lymphoma  are also affecting patient's functional outcome.   REHAB POTENTIAL: Good  CLINICAL DECISION MAKING: Stable/uncomplicated  EVALUATION COMPLEXITY: Low   GOALS: Goals reviewed with patient? Yes  SHORT TERM GOALS: Target date: 10/09/2023   Patient will be  independent and compliant with initial HEP.   Baseline: see above Goal status: MET  2.  Patient will demonstrate proper squat mechanics to reduce stress on her back with bending/lifting activity.  Baseline: see above  Goal status: INITIAL   LONG TERM GOALS: Target date: 11/09/23  Patient will score at least 72% on FOTO to signify clinically meaningful improvement in functional abilities.   Baseline: see above Goal status: INITIAL  2.  Patient will demonstrate 5/5 bilateral hip strength to improve overall lumbopelvic stability.  Baseline: see above Goal status: INITIAL  3.  Patient will report pain at worst rated as </=2/10 to reduce current functional limitations.  Baseline: 5 Goal status: INITIAL  4.  Patient will return to previous workout regimen without limitations as it relates to her back.  Baseline: not completing weightlifting  Goal status: INITIAL   PLAN:  PT FREQUENCY: 1-2x/week  PT DURATION: 8 weeks  PLANNED INTERVENTIONS: 97164- PT Re-evaluation, 97110-Therapeutic exercises, 97530- Therapeutic activity, [id number]- Neuromuscular re-education, 97535- Self Care, [id number]- Manual therapy, [id number]- Aquatic Therapy, [id number]- Traction (mechanical), and Cryotherapy.   PLAN FOR NEXT SESSION: review and progress HEP prn; trunk mobility; hip and core strengthening. no e-stim/heat due to lymphoma; supine exercises not comfortable due to prominent coccyx/sacrum; lifting mechanics   Lucie Meeter, PT, DPT, ATC 09/26/23 2:44 PM

## 2023-09-30 ENCOUNTER — Ambulatory Visit: Payer: Medicare Other

## 2023-09-30 DIAGNOSIS — M5459 Other low back pain: Secondary | ICD-10-CM | POA: Diagnosis not present

## 2023-09-30 DIAGNOSIS — M6281 Muscle weakness (generalized): Secondary | ICD-10-CM

## 2023-09-30 NOTE — Therapy (Signed)
 OUTPATIENT PHYSICAL THERAPY THORACOLUMBAR TREATMENT   Patient Name: Breanna Kirby MRN: [id number] DOB:03/20/1956,67 y.o., female Today's Date: 09/30/2023   END OF SESSION:  PT End of Session - 09/30/23 0843     Visit Number 5    Number of Visits 17    Date for PT Re-Evaluation 11/09/23    Authorization Type [employer] MCR    Progress Note Due on Visit 10    PT Start Time 0846    PT Stop Time 0929    PT Time Calculation (min) 43 min    Activity Tolerance Patient tolerated treatment well    Behavior During Therapy [facility] for tasks assessed/performed                  Past Medical History:  Diagnosis Date   Lymphoma [facility])    Past Surgical History:  Procedure Laterality Date   CESAREAN SECTION     Patient Active Problem List   Diagnosis Date Noted   Osteoporosis 11/19/2022   Acute deep vein thrombosis (DVT) of femoral vein (HCC) 12/19/2017   Fatigue 12/03/2017   Essential hypertension 03/14/2016   Follicular lymphoma grade II of lymph nodes of multiple sites (HCC) 04/06/2015   Vitiligo 01/22/2012    PCP: Cleotilde Planas, MD  REFERRING PROVIDER: Joane Artist RAMAN, MD   REFERRING DIAG:  [phone] (ICD-10-CM) - Left hip pain  M54.50,G89.29 (ICD-10-CM) - Chronic low back pain, unspecified back pain laterality, unspecified whether sciatica present    Rationale for Evaluation and Treatment: Rehabilitation  THERAPY DIAG:  Other low back pain  Muscle weakness (generalized)  ONSET DATE: September 2024  SUBJECTIVE:                                                                                                                                                                                           SUBJECTIVE STATEMENT: Patient reports she was a Ober sore in her back after last visit. Went to the gym yesterday, no issues with this. No pain right now.   EVAL: Patient reports in September she fell from stepping on a rotten piece of wood outside her home. She fell on her left  side during the fall and remembers having generalized pain,but nothing specific to the left side. She saw Dr. Joane and was given gabapentin  after this fall, but didn't take much of this. She rested after the fall, but over the past month she has had more localized pain to the left low back/hip. She went back to see Dr. Joane and was encouraged to begin PT. She is very active, but is still getting pain with her workouts. Tried to do  water aerobics, but could still feel pain in the back. She did yoga yesterday and that seemed to be helpful in reducing her pain. She wants to get back into her previous workout regimen which includes weight lifting three times weekly. She has not experienced any recent numbness/tingling, but was having occasional tingling about the top of the Lt foot. Patient denies any changes in bowel/bladder.   PERTINENT HISTORY:  Lymphoma- currently being monitored, no active treatment  Osteoporosis   PAIN:  Are you having pain? Yes: NPRS scale: 0 currently; at worst 2-3/10 Pain location: low back/sacrum/posterior Lt hip Pain description: ache Aggravating factors: lifting, first walking Relieving factors: yoga, increased walking  PRECAUTIONS: Other: lymphoma  WEIGHT BEARING RESTRICTIONS: No  FALLS:  Has patient fallen in last 6 months? Yes. Number of falls 1 was redoing deck and stepped on rotten wood and fell  LIVING ENVIRONMENT: Lives with: lives with their spouse Lives in: House/apartment Stairs: Yes: Internal: flight steps; on left going up Has following equipment at home: None  OCCUPATION: retired   PLOF: Independent  PATIENT GOALS: good exercises I can fall back on at home and get back into working out.    OBJECTIVE:  Note: Objective measures were completed at Evaluation unless otherwise noted.  DIAGNOSTIC FINDINGS:  Lumbar X-ray: IMPRESSION: 1. No fracture or subluxation of the lumbar spine. 2. Mild degenerative disc disease at L3-L4 and L5-S1. Lower  lumbar facet hypertrophy.  PATIENT SURVEYS:  FOTO 65% function to 72% predicted   SCREENING FOR RED FLAGS: Bowel or bladder incontinence: No Cauda equina syndrome: No   COGNITION: Overall cognitive status: Within functional limits for tasks assessed     SENSATION: Not tested  MUSCLE LENGTH: Hamstrings: WNL bilaterally   POSTURE: decreased lumbar lordosis, prominent coccyx/sacrum   PALPATION: TTP bilateral SIJ, posterior ilium, PSIS, Lt gluteals   LUMBAR ROM:   AROM eval  Flexion WFL  Extension 25% limited  Right lateral flexion WFL  Left lateral flexion WFL  Right rotation 25% limited  Left rotation 25% limited    (Blank rows = not tested)  LOWER EXTREMITY ROM:     Active  Right eval Left eval  Hip flexion Full  Full   Hip extension    Hip abduction    Hip adduction    Hip internal rotation    Hip external rotation    Knee flexion    Knee extension    Ankle dorsiflexion    Ankle plantarflexion    Ankle inversion    Ankle eversion     (Blank rows = not tested)  LOWER EXTREMITY MMT:    MMT Right eval Left eval 09/26/23  Hip flexion 4- 4- 4+ bilateral   Hip extension 4- 4-   Hip abduction 4 4   Hip adduction     Hip internal rotation     Hip external rotation     Knee flexion 5 5   Knee extension 5 5   Ankle dorsiflexion 5 5   Ankle plantarflexion 5 5   Ankle inversion     Ankle eversion 5 5    (Blank rows = not tested)  LUMBAR SPECIAL TESTS:  (-) SLR, FABER, Sacral Thrust (+) Thigh thrust   FUNCTIONAL TESTS:  Squat: limited knee flexion.  SLS: 20 seconds each   GAIT: Distance walked: 10 ft  Assistive device utilized: None Level of assistance: Complete Independence Comments: WNL [facility] Adult PT Treatment:  DATE: 09/30/23 Therapeutic Exercise: Ellipitical level 1.5 x 5 minutes  Squat to table 5# kettlebell 2 x 10  Quadruped arm reach 2 x 10  Quadruped leg extension 2 x 10  Fire hydrant d/c  due to form  Standing hip extension green band x 10; x 10 with hip hinge  Lateral band walks green band at shins 2 sets d/b x 10 ft  Sidelying hip abduction 2 x 10 @ 1.5 lbs Updated HEP   Self Care: Progress into previous gym routine and yoga to tolerance    [facility] Adult PT Treatment:                                                DATE: 09/26/23 Therapeutic Exercise: Elliptical level 1.5 x 5 minutes  Physioball wall squats 2 x 10  Pallof press 2 x 10 blue band  Standing hip abduction green band 2 x 10  Standing hip extension green band 2 x 10  Plank x 30 sec. Updated HEP   Self Care: Discussed gym equipment to utilize    [facility] Adult PT Treatment:                                                DATE: 09/23/23 Therapeutic Exercise: Seated hip hinge with dowel x 10  Standing hip hinge with dowel x 10  Hip hinge with 5 lb kettlebell to 6 inch step x 10  Clamshells 2 x 10 blue band  Leg press 2 x 10 @ 65 lbs  Updated HEP   Self Care: Discussed appropriate gym equipment to utilize currently    [facility] Adult PT Treatment:                                                DATE: 09/19/23 Therapeutic Exercise: Cat/cow x 10; gentle ROM Open book x 10  Prone hip extension 2 x 10  Sidelying hip abduction 2 x 10  Seated figure 4 stretch x 30 sec each  Seated pelvic tilts 2 x 10  Seated TA march 2 x 10  Clamshells 2 x 10; blue band  Updated HEP   PATIENT EDUCATION:  Education details: HEP update  Person educated: Patient Education method: Explanation, Demonstration, Tactile cues, Verbal cues, and Handouts Education comprehension: verbalized understanding, returned demonstration, verbal cues required, tactile cues required, and needs further education  HOME EXERCISE PROGRAM: Access Code: [email or web address] URL: [email or web address] Date: 09/30/2023 Prepared by: Lucie Meeter  Exercises - Prone Hip Extension  - 1 x daily - 7 x weekly - 2 sets - 10 reps - Sidelying Hip Abduction   - 1 x daily - 7 x weekly - 2 sets - 10 reps - Cat Cow  - 1 x daily - 7 x weekly - 1 sets - 10 reps - Sidelying Thoracic Rotation with Open Book  - 1 x daily - 7 x weekly - 1 sets - 10 reps - Seated Pelvic Tilt  - 1 x daily - 7 x weekly - 2 sets - 10 reps - Clamshell with Resistance  - 1 x daily -  7 x weekly - 2 sets - 10 reps - Standing Hip Hinge with Dowel  - 1 x daily - 7 x weekly - 2 sets - 10 reps - Standing Anti-Rotation Press with Anchored Resistance  - 1 x daily - 7 x weekly - 2 sets - 10 reps - Standing Hip Abduction with Resistance at Ankles and Counter Support  - 1 x daily - 7 x weekly - 2 sets - 10 reps - Standing Hip Extension with Resistance at Ankles and Counter Support  - 1 x daily - 7 x weekly - 2 sets - 10 reps - Wall Squat with Swiss Ball  - 1 x daily - 3 x weekly - 2 sets - 10 reps - Quadruped Alternating Arm Lift  - 1 x daily - 7 x weekly - 2 sets - 10 reps  ASSESSMENT:  CLINICAL IMPRESSION: Patient arrives without reports of pain. Progressed hip/core strengthening with introduction to quadruped strengthening. Unable to maintain stability when attempting fire hydrant exercise in quadruped with significant trunk rotation noted, so this was discontinued. Better stability with quadruped arm reach and leg lift. She demonstrates proper squat form to table with light resistance without onset of pain. She was encouraged to work back into her previous gym routine as she has been tolerating strength progression well in PT with patient verbalizing understanding.   EVAL: Patient is a 68 y.o. female who was seen today for physical therapy evaluation and treatment for chronic low back/posterior hip pain that has been ongoing since a fall in September with recent worsening over the past month. Upon assessment she is noted to have slight limitation into trunk extension and rotation, but pain free ROM in all planes. She exhibits core and gluteal weakness and aberrant squatting mechanics. She  will benefit from skilled PT to address the above stated deficits in order to optimize her function and assist in overall pain reduction.   OBJECTIVE IMPAIRMENTS: decreased activity tolerance, decreased knowledge of condition, difficulty walking, decreased ROM, decreased strength, improper body mechanics, postural dysfunction, and pain.   ACTIVITY LIMITATIONS: lifting, bending, squatting, and locomotion level  PARTICIPATION LIMITATIONS:  recreation  PERSONAL FACTORS: Age, Time since onset of injury/illness/exacerbation, and 1-2 comorbidities: osteoporosis, lymphoma  are also affecting patient's functional outcome.   REHAB POTENTIAL: Good  CLINICAL DECISION MAKING: Stable/uncomplicated  EVALUATION COMPLEXITY: Low   GOALS: Goals reviewed with patient? Yes  SHORT TERM GOALS: Target date: 10/09/2023   Patient will be independent and compliant with initial HEP.   Baseline: see above Goal status: MET  2.  Patient will demonstrate proper squat mechanics to reduce stress on her back with bending/lifting activity.  Baseline: see above  Goal status: INITIAL   LONG TERM GOALS: Target date: 11/09/23  Patient will score at least 72% on FOTO to signify clinically meaningful improvement in functional abilities.   Baseline: see above Goal status: INITIAL  2.  Patient will demonstrate 5/5 bilateral hip strength to improve overall lumbopelvic stability.  Baseline: see above Goal status: INITIAL  3.  Patient will report pain at worst rated as </=2/10 to reduce current functional limitations.  Baseline: 5 Goal status: INITIAL  4.  Patient will return to previous workout regimen without limitations as it relates to her back.  Baseline: not completing weightlifting  Goal status: INITIAL   PLAN:  PT FREQUENCY: 1-2x/week  PT DURATION: 8 weeks  PLANNED INTERVENTIONS: 97164- PT Re-evaluation, 97110-Therapeutic exercises, 97530- Therapeutic activity, [id number]- Neuromuscular re-education,  97535- Self Care, [id number]- Manual  therapy, 97113- Aquatic Therapy, [id number]- Traction (mechanical), and Cryotherapy.   PLAN FOR NEXT SESSION: review and progress HEP prn; trunk mobility; hip and core strengthening. no e-stim/heat due to lymphoma; supine exercises not comfortable due to prominent coccyx/sacrum; lifting mechanics   Lucie Meeter, PT, DPT, ATC 09/30/23 9:32 AM

## 2023-10-03 ENCOUNTER — Ambulatory Visit: Payer: Medicare Other

## 2023-10-03 DIAGNOSIS — M6281 Muscle weakness (generalized): Secondary | ICD-10-CM

## 2023-10-03 DIAGNOSIS — M5459 Other low back pain: Secondary | ICD-10-CM

## 2023-10-03 NOTE — Therapy (Signed)
 OUTPATIENT PHYSICAL THERAPY THORACOLUMBAR TREATMENT   Patient Name: Breanna Kirby MRN: [id number] DOB:Apr 22, 1956,67 y.o., female Today's Date: 10/03/2023   END OF SESSION:  PT End of Session - 10/03/23 0847     Visit Number 6    Number of Visits 17    Date for PT Re-Evaluation 11/09/23    Authorization Type [employer] MCR    Progress Note Due on Visit 10    PT Start Time 0847    PT Stop Time 0929    PT Time Calculation (min) 42 min    Activity Tolerance Patient tolerated treatment well    Behavior During Therapy [facility] for tasks assessed/performed                   Past Medical History:  Diagnosis Date   Lymphoma [facility])    Past Surgical History:  Procedure Laterality Date   CESAREAN SECTION     Patient Active Problem List   Diagnosis Date Noted   Osteoporosis 11/19/2022   Acute deep vein thrombosis (DVT) of femoral vein (HCC) 12/19/2017   Fatigue 12/03/2017   Essential hypertension 03/14/2016   Follicular lymphoma grade II of lymph nodes of multiple sites (HCC) 04/06/2015   Vitiligo 01/22/2012    PCP: Cleotilde Planas, MD  REFERRING PROVIDER: Joane Artist RAMAN, MD   REFERRING DIAG:  [phone] (ICD-10-CM) - Left hip pain  M54.50,G89.29 (ICD-10-CM) - Chronic low back pain, unspecified back pain laterality, unspecified whether sciatica present    Rationale for Evaluation and Treatment: Rehabilitation  THERAPY DIAG:  Other low back pain  Muscle weakness (generalized)  ONSET DATE: September 2024  SUBJECTIVE:                                                                                                                                                                                           SUBJECTIVE STATEMENT: Patient reports the back was a [place] achy after she went to the gym. Is unsure if there was a particular exercise that aggravated her back.   EVAL: Patient reports in September she fell from stepping on a rotten piece of wood outside her home. She fell on  her left side during the fall and remembers having generalized pain,but nothing specific to the left side. She saw Dr. Joane and was given gabapentin  after this fall, but didn't take much of this. She rested after the fall, but over the past month she has had more localized pain to the left low back/hip. She went back to see Dr. Joane and was encouraged to begin PT. She is very active, but is still getting pain with her workouts. Tried to  do water aerobics, but could still feel pain in the back. She did yoga yesterday and that seemed to be helpful in reducing her pain. She wants to get back into her previous workout regimen which includes weight lifting three times weekly. She has not experienced any recent numbness/tingling, but was having occasional tingling about the top of the Lt foot. Patient denies any changes in bowel/bladder.   PERTINENT HISTORY:  Lymphoma- currently being monitored, no active treatment  Osteoporosis   PAIN:  Are you having pain? Yes: NPRS scale: 0 currently; at worst 2/10 Pain location: low back/sacrum/posterior Lt hip Pain description: ache Aggravating factors: lifting, first walking Relieving factors: yoga, increased walking  PRECAUTIONS: Other: lymphoma  WEIGHT BEARING RESTRICTIONS: No  FALLS:  Has patient fallen in last 6 months? Yes. Number of falls 1 was redoing deck and stepped on rotten wood and fell  LIVING ENVIRONMENT: Lives with: lives with their spouse Lives in: House/apartment Stairs: Yes: Internal: flight steps; on left going up Has following equipment at home: None  OCCUPATION: retired   PLOF: Independent  PATIENT GOALS: good exercises I can fall back on at home and get back into working out.    OBJECTIVE:  Note: Objective measures were completed at Evaluation unless otherwise noted.  DIAGNOSTIC FINDINGS:  Lumbar X-ray: IMPRESSION: 1. No fracture or subluxation of the lumbar spine. 2. Mild degenerative disc disease at L3-L4 and L5-S1.  Lower lumbar facet hypertrophy.  PATIENT SURVEYS:  FOTO 65% function to 72% predicted  10/03/23: 83% function  SCREENING FOR RED FLAGS: Bowel or bladder incontinence: No Cauda equina syndrome: No   COGNITION: Overall cognitive status: Within functional limits for tasks assessed     SENSATION: Not tested  MUSCLE LENGTH: Hamstrings: WNL bilaterally   POSTURE: decreased lumbar lordosis, prominent coccyx/sacrum   PALPATION: TTP bilateral SIJ, posterior ilium, PSIS, Lt gluteals   LUMBAR ROM:   AROM eval  Flexion WFL  Extension 25% limited  Right lateral flexion WFL  Left lateral flexion WFL  Right rotation 25% limited  Left rotation 25% limited    (Blank rows = not tested)  LOWER EXTREMITY ROM:     Active  Right eval Left eval  Hip flexion Full  Full   Hip extension    Hip abduction    Hip adduction    Hip internal rotation    Hip external rotation    Knee flexion    Knee extension    Ankle dorsiflexion    Ankle plantarflexion    Ankle inversion    Ankle eversion     (Blank rows = not tested)  LOWER EXTREMITY MMT:    MMT Right eval Left eval 09/26/23  Hip flexion 4- 4- 4+ bilateral   Hip extension 4- 4-   Hip abduction 4 4   Hip adduction     Hip internal rotation     Hip external rotation     Knee flexion 5 5   Knee extension 5 5   Ankle dorsiflexion 5 5   Ankle plantarflexion 5 5   Ankle inversion     Ankle eversion 5 5    (Blank rows = not tested)  LUMBAR SPECIAL TESTS:  (-) SLR, FABER, Sacral Thrust (+) Thigh thrust   FUNCTIONAL TESTS:  Squat: limited knee flexion.  SLS: 20 seconds each   GAIT: Distance walked: 10 ft  Assistive device utilized: None Level of assistance: Complete Independence Comments: WNL [facility] Adult PT Treatment:  DATE: 10/03/23 Therapeutic Exercise: Elliptical level 1.5 x 5 minutes  Squats with kettlebell 2 x 10 @ 5 lbs  Quadruped (on elbows) with leg extension 2 x 10   Sidelying hip taps 2 x 10  Updated HEP   Self Care: Discussed bending, lifting mechanics with handout provided    [facility] Adult PT Treatment:                                                DATE: 09/30/23 Therapeutic Exercise: Ellipitical level 1.5 x 5 minutes  Squat to table 5# kettlebell 2 x 10  Quadruped arm reach 2 x 10  Quadruped leg extension 2 x 10  Fire hydrant d/c due to form  Standing hip extension green band x 10; x 10 with hip hinge  Lateral band walks green band at shins 2 sets d/b x 10 ft  Sidelying hip abduction 2 x 10 @ 1.5 lbs Updated HEP   Self Care: Progress into previous gym routine and yoga to tolerance    [facility] Adult PT Treatment:                                                DATE: 09/26/23 Therapeutic Exercise: Elliptical level 1.5 x 5 minutes  Physioball wall squats 2 x 10  Pallof press 2 x 10 blue band  Standing hip abduction green band 2 x 10  Standing hip extension green band 2 x 10  Plank x 30 sec. Updated HEP   Self Care: Discussed gym equipment to utilize    [facility] Adult PT Treatment:                                                DATE: 09/23/23 Therapeutic Exercise: Seated hip hinge with dowel x 10  Standing hip hinge with dowel x 10  Hip hinge with 5 lb kettlebell to 6 inch step x 10  Clamshells 2 x 10 blue band  Leg press 2 x 10 @ 65 lbs  Updated HEP   Self Care: Discussed appropriate gym equipment to utilize currently    [facility] Adult PT Treatment:                                                DATE: 09/19/23 Therapeutic Exercise: Cat/cow x 10; gentle ROM Open book x 10  Prone hip extension 2 x 10  Sidelying hip abduction 2 x 10  Seated figure 4 stretch x 30 sec each  Seated pelvic tilts 2 x 10  Seated TA march 2 x 10  Clamshells 2 x 10; blue band  Updated HEP   PATIENT EDUCATION:  Education details: HEP update  Person educated: Patient Education method: Explanation, Demonstration, Tactile cues, Verbal cues, and  Handouts Education comprehension: verbalized understanding, returned demonstration, verbal cues required, tactile cues required, and needs further education  HOME EXERCISE PROGRAM: Access Code: [email or web address] URL: [email or web address] Date: 10/03/2023 Prepared by: Lucie Meeter  Exercises - Prone  Hip Extension  - 1 x daily - 7 x weekly - 2 sets - 10 reps - Sidelying Hip Abduction  - 1 x daily - 7 x weekly - 2 sets - 10 reps - Cat Cow  - 1 x daily - 7 x weekly - 1 sets - 10 reps - Sidelying Thoracic Rotation with Open Book  - 1 x daily - 7 x weekly - 1 sets - 10 reps - Seated Pelvic Tilt  - 1 x daily - 7 x weekly - 2 sets - 10 reps - Clamshell with Resistance  - 1 x daily - 7 x weekly - 2 sets - 10 reps - Standing Hip Hinge with Dowel  - 1 x daily - 7 x weekly - 2 sets - 10 reps - Standing Anti-Rotation Press with Anchored Resistance  - 1 x daily - 7 x weekly - 2 sets - 10 reps - Standing Hip Abduction with Resistance at Ankles and Counter Support  - 1 x daily - 7 x weekly - 2 sets - 10 reps - Standing Hip Extension with Resistance at Ankles and Counter Support  - 1 x daily - 7 x weekly - 2 sets - 10 reps - Wall Squat with Swiss Ball  - 1 x daily - 3 x weekly - 2 sets - 10 reps - Quadruped Alternating Leg Extensions  - 1 x daily - 7 x weekly - 2 sets - 10 reps - Squat with Chair Touch  - 1 x daily - 7 x weekly - 2 sets - 10 reps  ASSESSMENT:  CLINICAL IMPRESSION: Patient arrives without reports of pain. Time spent discussing proper bending/lifting mechanics with handout provided. With squats she has initial difficulty allowing for hip flexion (excessive knee flexion, trunk extension). With cues she is better able to engage her glutes and allow for increased lowering at the hips. FOTO score has significantly improved, having surpassed the predicted outcome. No reports of back pain throughout session.   EVAL: Patient is a 68 y.o. female who was seen today for physical therapy  evaluation and treatment for chronic low back/posterior hip pain that has been ongoing since a fall in September with recent worsening over the past month. Upon assessment she is noted to have slight limitation into trunk extension and rotation, but pain free ROM in all planes. She exhibits core and gluteal weakness and aberrant squatting mechanics. She will benefit from skilled PT to address the above stated deficits in order to optimize her function and assist in overall pain reduction.   OBJECTIVE IMPAIRMENTS: decreased activity tolerance, decreased knowledge of condition, difficulty walking, decreased ROM, decreased strength, improper body mechanics, postural dysfunction, and pain.   ACTIVITY LIMITATIONS: lifting, bending, squatting, and locomotion level  PARTICIPATION LIMITATIONS:  recreation  PERSONAL FACTORS: Age, Time since onset of injury/illness/exacerbation, and 1-2 comorbidities: osteoporosis, lymphoma  are also affecting patient's functional outcome.   REHAB POTENTIAL: Good  CLINICAL DECISION MAKING: Stable/uncomplicated  EVALUATION COMPLEXITY: Low   GOALS: Goals reviewed with patient? Yes  SHORT TERM GOALS: Target date: 10/09/2023   Patient will be independent and compliant with initial HEP.   Baseline: see above Goal status: MET  2.  Patient will demonstrate proper squat mechanics to reduce stress on her back with bending/lifting activity.  Baseline: see above  Goal status: progressing    LONG TERM GOALS: Target date: 11/09/23  Patient will score at least 72% on FOTO to signify clinically meaningful improvement in functional abilities.   Baseline:  see above Goal status: MET  2.  Patient will demonstrate 5/5 bilateral hip strength to improve overall lumbopelvic stability.  Baseline: see above Goal status: INITIAL  3.  Patient will report pain at worst rated as </=2/10 to reduce current functional limitations.  Baseline: 5 Goal status: INITIAL  4.  Patient  will return to previous workout regimen without limitations as it relates to her back.  Baseline: not completing weightlifting  Goal status: INITIAL   PLAN:  PT FREQUENCY: 1-2x/week  PT DURATION: 8 weeks  PLANNED INTERVENTIONS: 97164- PT Re-evaluation, 97110-Therapeutic exercises, 97530- Therapeutic activity, [id number]- Neuromuscular re-education, 97535- Self Care, [id number]- Manual therapy, [id number]- Aquatic Therapy, [id number]- Traction (mechanical), and Cryotherapy.   PLAN FOR NEXT SESSION: review and progress HEP prn; trunk mobility; hip and core strengthening. no e-stim/heat due to lymphoma; supine exercises not comfortable due to prominent coccyx/sacrum; lifting mechanics   Lucie Meeter, PT, DPT, ATC 10/03/23 9:29 AM

## 2023-10-03 NOTE — Patient Instructions (Signed)

## 2023-10-07 ENCOUNTER — Ambulatory Visit: Payer: Medicare Other

## 2023-10-07 DIAGNOSIS — M5459 Other low back pain: Secondary | ICD-10-CM | POA: Diagnosis not present

## 2023-10-07 DIAGNOSIS — M6281 Muscle weakness (generalized): Secondary | ICD-10-CM

## 2023-10-07 NOTE — Therapy (Signed)
 OUTPATIENT PHYSICAL THERAPY THORACOLUMBAR TREATMENT   Patient Name: Breanna Kirby MRN: [id number] DOB:09/13/1956,67 y.o., female Today's Date: 10/07/2023   END OF SESSION:  PT End of Session - 10/07/23 0851     Visit Number 7    Number of Visits 17    Date for PT Re-Evaluation 11/09/23    Authorization Type [employer] MCR    Progress Note Due on Visit 10    PT Start Time [phone]    PT Stop Time 0930    PT Time Calculation (min) 39 min    Activity Tolerance Patient tolerated treatment well    Behavior During Therapy [facility] for tasks assessed/performed                    Past Medical History:  Diagnosis Date   Lymphoma (HCC)    Past Surgical History:  Procedure Laterality Date   CESAREAN SECTION     Patient Active Problem List   Diagnosis Date Noted   Osteoporosis 11/19/2022   Acute deep vein thrombosis (DVT) of femoral vein (HCC) 12/19/2017   Fatigue 12/03/2017   Essential hypertension 03/14/2016   Follicular lymphoma grade II of lymph nodes of multiple sites (HCC) 04/06/2015   Vitiligo 01/22/2012    PCP: Cleotilde Planas, MD  REFERRING PROVIDER: Joane Artist RAMAN, MD   REFERRING DIAG:  [phone] (ICD-10-CM) - Left hip pain  M54.50,G89.29 (ICD-10-CM) - Chronic low back pain, unspecified back pain laterality, unspecified whether sciatica present    Rationale for Evaluation and Treatment: Rehabilitation  THERAPY DIAG:  Other low back pain  Muscle weakness (generalized)  ONSET DATE: September 2024  SUBJECTIVE:                                                                                                                                                                                           SUBJECTIVE STATEMENT: Occasional twinges in the back, but overall doing really well.   EVAL: Patient reports in September she fell from stepping on a rotten piece of wood outside her home. She fell on her left side during the fall and remembers having generalized pain,but  nothing specific to the left side. She saw Dr. Joane and was given gabapentin  after this fall, but didn't take much of this. She rested after the fall, but over the past month she has had more localized pain to the left low back/hip. She went back to see Dr. Joane and was encouraged to begin PT. She is very active, but is still getting pain with her workouts. Tried to do water aerobics, but could still feel pain in the back. She did yoga yesterday  and that seemed to be helpful in reducing her pain. She wants to get back into her previous workout regimen which includes weight lifting three times weekly. She has not experienced any recent numbness/tingling, but was having occasional tingling about the top of the Lt foot. Patient denies any changes in bowel/bladder.   PERTINENT HISTORY:  Lymphoma- currently being monitored, no active treatment  Osteoporosis   PAIN:  Are you having pain? Yes: NPRS scale: 0 currently; at worst 2/10 Pain location: low back/sacrum/posterior Lt hip Pain description: ache Aggravating factors: lifting, first walking Relieving factors: yoga, increased walking  PRECAUTIONS: Other: lymphoma  WEIGHT BEARING RESTRICTIONS: No  FALLS:  Has patient fallen in last 6 months? Yes. Number of falls 1 was redoing deck and stepped on rotten wood and fell  LIVING ENVIRONMENT: Lives with: lives with their spouse Lives in: House/apartment Stairs: Yes: Internal: flight steps; on left going up Has following equipment at home: None  OCCUPATION: retired   PLOF: Independent  PATIENT GOALS: good exercises I can fall back on at home and get back into working out.    OBJECTIVE:  Note: Objective measures were completed at Evaluation unless otherwise noted.  DIAGNOSTIC FINDINGS:  Lumbar X-ray: IMPRESSION: 1. No fracture or subluxation of the lumbar spine. 2. Mild degenerative disc disease at L3-L4 and L5-S1. Lower lumbar facet hypertrophy.  PATIENT SURVEYS:  FOTO 65%  function to 72% predicted  10/03/23: 83% function  SCREENING FOR RED FLAGS: Bowel or bladder incontinence: No Cauda equina syndrome: No   COGNITION: Overall cognitive status: Within functional limits for tasks assessed     SENSATION: Not tested  MUSCLE LENGTH: Hamstrings: WNL bilaterally   POSTURE: decreased lumbar lordosis, prominent coccyx/sacrum   PALPATION: TTP bilateral SIJ, posterior ilium, PSIS, Lt gluteals   LUMBAR ROM:   AROM eval  Flexion WFL  Extension 25% limited  Right lateral flexion WFL  Left lateral flexion WFL  Right rotation 25% limited  Left rotation 25% limited    (Blank rows = not tested)  LOWER EXTREMITY ROM:     Active  Right eval Left eval  Hip flexion Full  Full   Hip extension    Hip abduction    Hip adduction    Hip internal rotation    Hip external rotation    Knee flexion    Knee extension    Ankle dorsiflexion    Ankle plantarflexion    Ankle inversion    Ankle eversion     (Blank rows = not tested)  LOWER EXTREMITY MMT:    MMT Right eval Left eval 09/26/23  Hip flexion 4- 4- 4+ bilateral   Hip extension 4- 4-   Hip abduction 4 4   Hip adduction     Hip internal rotation     Hip external rotation     Knee flexion 5 5   Knee extension 5 5   Ankle dorsiflexion 5 5   Ankle plantarflexion 5 5   Ankle inversion     Ankle eversion 5 5    (Blank rows = not tested)  LUMBAR SPECIAL TESTS:  (-) SLR, FABER, Sacral Thrust (+) Thigh thrust   FUNCTIONAL TESTS:  Squat: limited knee flexion.  SLS: 20 seconds each   GAIT: Distance walked: 10 ft  Assistive device utilized: None Level of assistance: Complete Independence Comments: WNL  [facility] Adult PT Treatment:  DATE: 10/07/23 Therapeutic Exercise: Elliptical level 2 x 5 minutes  Squat to table x 10 @ 5 lbs; x 10 @ 10 lbs  Squat 5 lbs x 10; 10 lbs 2 x 10  Standing hip extension at plinth 2 x 10  Overhead press x 10 @ 5 lb  dumbbells  Bent over row x 10 each @ 5 lbs dumbbell  Mini lunge x 5 each  Updated HEP   Therapeutic Activity: Lifting 10 lbs educated on proper form with demo and returned demo x 1    [facility] Adult PT Treatment:                                                DATE: 10/03/23 Therapeutic Exercise: Elliptical level 1.5 x 5 minutes  Squats with kettlebell 2 x 10 @ 5 lbs  Quadruped (on elbows) with leg extension 2 x 10  Sidelying hip taps 2 x 10  Updated HEP   Self Care: Discussed bending, lifting mechanics with handout provided    [facility] Adult PT Treatment:                                                DATE: 09/30/23 Therapeutic Exercise: Ellipitical level 1.5 x 5 minutes  Squat to table 5# kettlebell 2 x 10  Quadruped arm reach 2 x 10  Quadruped leg extension 2 x 10  Fire hydrant d/c due to form  Standing hip extension green band x 10; x 10 with hip hinge  Lateral band walks green band at shins 2 sets d/b x 10 ft  Sidelying hip abduction 2 x 10 @ 1.5 lbs Updated HEP   Self Care: Progress into previous gym routine and yoga to tolerance    [facility] Adult PT Treatment:                                                DATE: 09/26/23 Therapeutic Exercise: Elliptical level 1.5 x 5 minutes  Physioball wall squats 2 x 10  Pallof press 2 x 10 blue band  Standing hip abduction green band 2 x 10  Standing hip extension green band 2 x 10  Plank x 30 sec. Updated HEP   Self Care: Discussed gym equipment to utilize    [facility] Adult PT Treatment:                                                DATE: 09/23/23 Therapeutic Exercise: Seated hip hinge with dowel x 10  Standing hip hinge with dowel x 10  Hip hinge with 5 lb kettlebell to 6 inch step x 10  Clamshells 2 x 10 blue band  Leg press 2 x 10 @ 65 lbs  Updated HEP   Self Care: Discussed appropriate gym equipment to utilize currently    [facility] Adult PT Treatment:  DATE: 09/19/23 Therapeutic  Exercise: Cat/cow x 10; gentle ROM Open book x 10  Prone hip extension 2 x 10  Sidelying hip abduction 2 x 10  Seated figure 4 stretch x 30 sec each  Seated pelvic tilts 2 x 10  Seated TA march 2 x 10  Clamshells 2 x 10; blue band  Updated HEP   PATIENT EDUCATION:  Education details: HEP update  Person educated: Patient Education method: Explanation, Demonstration, Tactile cues, Verbal cues, and Handouts Education comprehension: verbalized understanding, returned demonstration, verbal cues required, tactile cues required, and needs further education  HOME EXERCISE PROGRAM: Access Code: [email or web address] URL: [email or web address] Date: 10/07/2023 Prepared by: Lucie Meeter  Exercises - Prone Hip Extension  - 1 x daily - 7 x weekly - 2 sets - 10 reps - Sidelying Hip Abduction  - 1 x daily - 7 x weekly - 2 sets - 10 reps - Cat Cow  - 1 x daily - 7 x weekly - 1 sets - 10 reps - Sidelying Thoracic Rotation with Open Book  - 1 x daily - 7 x weekly - 1 sets - 10 reps - Seated Pelvic Tilt  - 1 x daily - 7 x weekly - 2 sets - 10 reps - Clamshell with Resistance  - 1 x daily - 7 x weekly - 2 sets - 10 reps - Standing Anti-Rotation Press with Anchored Resistance  - 1 x daily - 7 x weekly - 2 sets - 10 reps - Standing Hip Abduction with Resistance at Ankles and Counter Support  - 1 x daily - 7 x weekly - 2 sets - 10 reps - Standing Hip Extension with Resistance at Ankles and Counter Support  - 1 x daily - 7 x weekly - 2 sets - 10 reps - Wall Squat with Swiss Ball  - 1 x daily - 3 x weekly - 2 sets - 10 reps - Quadruped Alternating Leg Extensions  - 1 x daily - 7 x weekly - 2 sets - 10 reps - Squat with Chair Touch  - 1 x daily - 7 x weekly - 2 sets - 10 reps - Quarter Squat with Dumbbells  - 1 x daily - 7 x weekly - 2 sets - 10 reps - Standing Bent Over Single Arm Shoulder Row  - 1 x daily - 7 x weekly - 2 sets - 10 reps - Standing Partial Lunge  - 1 x daily - 7 x weekly - 2 sets - 10  reps  ASSESSMENT:  CLINICAL IMPRESSION: Patient arrives without reports of pain. She demonstrates proper squat form without onset of pain with light lifting, having met this goal. Progressed strengthening including gym specific exercises that patient was unsure if she was doing correctly with patient requiring cues for proper form for bent over rows and mini lunges. With mini lunges she does exhibit weakness with notable trunk lean/hip drop when she attempts without UE support.   EVAL: Patient is a 68 y.o. female who was seen today for physical therapy evaluation and treatment for chronic low back/posterior hip pain that has been ongoing since a fall in September with recent worsening over the past month. Upon assessment she is noted to have slight limitation into trunk extension and rotation, but pain free ROM in all planes. She exhibits core and gluteal weakness and aberrant squatting mechanics. She will benefit from skilled PT to address the above stated deficits in order to optimize her function and assist in overall pain  reduction.   OBJECTIVE IMPAIRMENTS: decreased activity tolerance, decreased knowledge of condition, difficulty walking, decreased ROM, decreased strength, improper body mechanics, postural dysfunction, and pain.   ACTIVITY LIMITATIONS: lifting, bending, squatting, and locomotion level  PARTICIPATION LIMITATIONS:  recreation  PERSONAL FACTORS: Age, Time since onset of injury/illness/exacerbation, and 1-2 comorbidities: osteoporosis, lymphoma  are also affecting patient's functional outcome.   REHAB POTENTIAL: Good  CLINICAL DECISION MAKING: Stable/uncomplicated  EVALUATION COMPLEXITY: Low   GOALS: Goals reviewed with patient? Yes  SHORT TERM GOALS: Target date: 10/09/2023   Patient will be independent and compliant with initial HEP.   Baseline: see above Goal status: MET  2.  Patient will demonstrate proper squat mechanics to reduce stress on her back with  bending/lifting activity.  Baseline: see above  Goal status: MET    LONG TERM GOALS: Target date: 11/09/23  Patient will score at least 72% on FOTO to signify clinically meaningful improvement in functional abilities.   Baseline: see above Goal status: MET  2.  Patient will demonstrate 5/5 bilateral hip strength to improve overall lumbopelvic stability.  Baseline: see above Goal status: INITIAL  3.  Patient will report pain at worst rated as </=2/10 to reduce current functional limitations.  Baseline: 5 Goal status: INITIAL  4.  Patient will return to previous workout regimen without limitations as it relates to her back.  Baseline: not completing weightlifting  Goal status: INITIAL   PLAN:  PT FREQUENCY: 1-2x/week  PT DURATION: 8 weeks  PLANNED INTERVENTIONS: 97164- PT Re-evaluation, 97110-Therapeutic exercises, 97530- Therapeutic activity, [id number]- Neuromuscular re-education, 97535- Self Care, [id number]- Manual therapy, [id number]- Aquatic Therapy, [id number]- Traction (mechanical), and Cryotherapy.   PLAN FOR NEXT SESSION: review and progress HEP prn; trunk mobility; hip and core strengthening. no e-stim/heat due to lymphoma; supine exercises not comfortable due to prominent coccyx/sacrum; lifting mechanics   Lucie Meeter, PT, DPT, ATC 10/07/23 10:22 AM

## 2023-10-08 DIAGNOSIS — M810 Age-related osteoporosis without current pathological fracture: Secondary | ICD-10-CM | POA: Diagnosis not present

## 2023-10-09 DIAGNOSIS — K080 Exfoliation of teeth due to systemic causes: Secondary | ICD-10-CM | POA: Diagnosis not present

## 2023-10-10 ENCOUNTER — Ambulatory Visit: Payer: Medicare Other

## 2023-10-10 DIAGNOSIS — M5459 Other low back pain: Secondary | ICD-10-CM

## 2023-10-10 DIAGNOSIS — M6281 Muscle weakness (generalized): Secondary | ICD-10-CM | POA: Diagnosis not present

## 2023-10-10 NOTE — Therapy (Signed)
OUTPATIENT PHYSICAL THERAPY THORACOLUMBAR TREATMENT PHYSICAL THERAPY DISCHARGE SUMMARY  Visits from Start of Care: 8  Current functional level related to goals / functional outcomes: See goals below   Remaining deficits: Intermittent low back/posterior hip pain    Education / Equipment: See education below    Patient agrees to discharge. Patient goals were met. Patient is being discharged due to meeting the stated rehab goals.   Patient Name: Breanna Kirby MRN: [id number] DOB:Jan 24, 1956,67 y.o., female Today's Date: 10/10/2023   END OF SESSION:  PT End of Session - 10/10/23 0847     Visit Number 8    Number of Visits 17    Date for PT Re-Evaluation 11/09/23    Authorization Type BCBS MCR    Progress Note Due on Visit 10    PT Start Time 0847    PT Stop Time 0926    PT Time Calculation (min) 39 min    Activity Tolerance Patient tolerated treatment well    Behavior During Therapy [facility] for tasks assessed/performed                     Past Medical History:  Diagnosis Date   Lymphoma [facility])    Past Surgical History:  Procedure Laterality Date   CESAREAN SECTION     Patient Active Problem List   Diagnosis Date Noted   Osteoporosis 11/19/2022   Acute deep vein thrombosis (DVT) of femoral vein (HCC) 12/19/2017   Fatigue 12/03/2017   Essential hypertension 03/14/2016   Follicular lymphoma grade II of lymph nodes of multiple sites (HCC) 04/06/2015   Vitiligo 01/22/2012    PCP: Sigmund Hazel, MD  REFERRING PROVIDER: Rodolph Bong, MD   REFERRING DIAG:  [phone] (ICD-10-CM) - Left hip pain  M54.50,G89.29 (ICD-10-CM) - Chronic low back pain, unspecified back pain laterality, unspecified whether sciatica present    Rationale for Evaluation and Treatment: Rehabilitation  THERAPY DIAG:  Other low back pain  Muscle weakness (generalized)  ONSET DATE: September 2024  SUBJECTIVE:                                                                                                                                                                                            SUBJECTIVE STATEMENT: Patient reports overall she is doing well. Still gets some coccyx/low back pain with prolonged sitting. She has been able to return to previous gym exercises and the back has been fine. She feels pleased with her progress and feels that she is ready for discharge.   EVAL: Patient reports in September she fell from stepping on a rotten piece of wood outside her  home. She fell on her left side during the fall and remembers having generalized pain,but nothing specific to the left side. She saw Dr. Denyse Amass and was given gabapentin after this fall, but didn't take much of this. She rested after the fall, but over the past month she has had more localized pain to the left low back/hip. She went back to see Dr. Denyse Amass and was encouraged to begin PT. She is very active, but is still getting pain with her workouts. Tried to do water aerobics, but could still feel pain in the back. She did yoga yesterday and that seemed to be helpful in reducing her pain. She wants to get back into her previous workout regimen which includes weight lifting three times weekly. She has not experienced any recent numbness/tingling, but was having occasional tingling about the top of the Lt foot. Patient denies any changes in bowel/bladder.   PERTINENT HISTORY:  Lymphoma- currently being monitored, no active treatment  Osteoporosis   PAIN:  Are you having pain? Yes: NPRS scale: 0 currently; at worst 2/10 Pain location: low back/sacrum/posterior Lt hip Pain description: ache Aggravating factors: lifting, first walking Relieving factors: yoga, increased walking  PRECAUTIONS: Other: lymphoma  WEIGHT BEARING RESTRICTIONS: No  FALLS:  Has patient fallen in last 6 months? Yes. Number of falls 1 was redoing deck and stepped on rotten wood and fell  LIVING ENVIRONMENT: Lives with: lives with their  spouse Lives in: House/apartment Stairs: Yes: Internal: flight steps; on left going up Has following equipment at home: None  OCCUPATION: retired   PLOF: Independent  PATIENT GOALS: "good exercises I can fall back on at home and get back into working out."    OBJECTIVE:  Note: Objective measures were completed at Evaluation unless otherwise noted.  DIAGNOSTIC FINDINGS:  Lumbar X-ray: IMPRESSION: 1. No fracture or subluxation of the lumbar spine. 2. Mild degenerative disc disease at L3-L4 and L5-S1. Lower lumbar facet hypertrophy.  PATIENT SURVEYS:  FOTO 65% function to 72% predicted  10/03/23: 83% function  SCREENING FOR RED FLAGS: Bowel or bladder incontinence: No Cauda equina syndrome: No   COGNITION: Overall cognitive status: Within functional limits for tasks assessed     SENSATION: Not tested  MUSCLE LENGTH: Hamstrings: WNL bilaterally   POSTURE: decreased lumbar lordosis, prominent coccyx/sacrum   PALPATION: TTP bilateral SIJ, posterior ilium, PSIS, Lt gluteals   LUMBAR ROM:   AROM eval 10/10/23  Flexion WFL WFL  Extension 25% limited WFL  Right lateral flexion [facility] WFL  Left lateral flexion [facility] WFL  Right rotation 25% limited 25% limited  Left rotation 25% limited  25% limited    (Blank rows = not tested)  LOWER EXTREMITY ROM:     Active  Right eval Left eval  Hip flexion Full  Full   Hip extension    Hip abduction    Hip adduction    Hip internal rotation    Hip external rotation    Knee flexion    Knee extension    Ankle dorsiflexion    Ankle plantarflexion    Ankle inversion    Ankle eversion     (Blank rows = not tested)  LOWER EXTREMITY MMT:    MMT Right eval Left eval 09/26/23 10/10/23  Hip flexion 4- 4- 4+ bilateral  5 bilateral   Hip extension 4- 4-  5 bilateral   Hip abduction 4 4  5  bilateral   Hip adduction      Hip internal rotation  Hip external rotation      Knee flexion 5 5    Knee extension 5 5    Ankle  dorsiflexion 5 5    Ankle plantarflexion 5 5    Ankle inversion      Ankle eversion 5 5     (Blank rows = not tested)  LUMBAR SPECIAL TESTS:  (-) SLR, FABER, Sacral Thrust (+) Thigh thrust   FUNCTIONAL TESTS:  Squat: limited knee flexion.  SLS: 20 seconds each   GAIT: Distance walked: 10 ft  Assistive device utilized: None Level of assistance: Complete Independence Comments: WNL [facility] Adult PT Treatment:                                                DATE: 10/10/23 Therapeutic Exercise: Reviewed and performed updated HEP discussing frequency, sets, reps.   Therapeutic Activity: Re-assessment of goals to determine overall progress, educating patient on progress towards goals.   Self Care: Seat cushion recommendation for the car with printout provided.   [facility] Adult PT Treatment:                                                DATE: 10/07/23 Therapeutic Exercise: Elliptical level 2 x 5 minutes  Squat to table x 10 @ 5 lbs; x 10 @ 10 lbs  Squat 5 lbs x 10; 10 lbs 2 x 10  Standing hip extension at plinth 2 x 10  Overhead press x 10 @ 5 lb dumbbells  Bent over row x 10 each @ 5 lbs dumbbell  Mini lunge x 5 each  Updated HEP   Therapeutic Activity: Lifting 10 lbs educated on proper form with demo and returned demo x 1    [facility] Adult PT Treatment:                                                DATE: 10/03/23 Therapeutic Exercise: Elliptical level 1.5 x 5 minutes  Squats with kettlebell 2 x 10 @ 5 lbs  Quadruped (on elbows) with leg extension 2 x 10  Sidelying hip taps 2 x 10  Updated HEP   Self Care: Discussed bending, lifting mechanics with handout provided    [facility] Adult PT Treatment:                                                DATE: 09/30/23 Therapeutic Exercise: Ellipitical level 1.5 x 5 minutes  Squat to table 5# kettlebell 2 x 10  Quadruped arm reach 2 x 10  Quadruped leg extension 2 x 10  Fire hydrant d/c due to form  Standing hip extension green band x 10; x 10  with hip hinge  Lateral band walks green band at shins 2 sets d/b x 10 ft  Sidelying hip abduction 2 x 10 @ 1.5 lbs Updated HEP   Self Care: Progress into previous gym routine and yoga to tolerance    [facility] Adult PT Treatment:  DATE: 09/26/23 Therapeutic Exercise: Elliptical level 1.5 x 5 minutes  Physioball wall squats 2 x 10  Pallof press 2 x 10 blue band  Standing hip abduction green band 2 x 10  Standing hip extension green band 2 x 10  Plank x 30 sec. Updated HEP   Self Care: Discussed gym equipment to utilize    [facility] Adult PT Treatment:                                                DATE: 09/23/23 Therapeutic Exercise: Seated hip hinge with dowel x 10  Standing hip hinge with dowel x 10  Hip hinge with 5 lb kettlebell to 6 inch step x 10  Clamshells 2 x 10 blue band  Leg press 2 x 10 @ 65 lbs  Updated HEP   Self Care: Discussed appropriate gym equipment to utilize currently    [facility] Adult PT Treatment:                                                DATE: 09/19/23 Therapeutic Exercise: Cat/cow x 10; gentle ROM Open book x 10  Prone hip extension 2 x 10  Sidelying hip abduction 2 x 10  Seated figure 4 stretch x 30 sec each  Seated pelvic tilts 2 x 10  Seated TA march 2 x 10  Clamshells 2 x 10; blue band  Updated HEP   PATIENT EDUCATION:  Education details: HEP; D/C education  Person educated: Patient Education method: Explanation, Facilities manager, and Handouts Education comprehension: verbalized understanding, returned demonstration,  HOME EXERCISE PROGRAM: Access Code: [email or web address] URL: [email or web address] Date: 10/10/2023 Prepared by: Letitia Libra  Exercises - Seated Cat Cow  - 1 x daily - 7 x weekly - 1 sets - 10 reps - Seated Pelvic Tilt  - 1 x daily - 7 x weekly - 1 sets - 10 reps - Sidelying Thoracic Rotation with Open Book  - 1 x daily - 7 x weekly - 1 sets - 10 reps - Prone Hip Extension   - 1 x daily - 3 x weekly - 2 sets - 10 reps - Sidelying Hip Abduction  - 1 x daily - 3 x weekly - 2 sets - 10 reps - Clamshell with Resistance  - 1 x daily - 3 x weekly - 2 sets - 10 reps - Standing Anti-Rotation Press with Anchored Resistance  - 1 x daily - 3 x weekly - 2 sets - 10 reps - Standing Hip Abduction with Resistance at Ankles and Counter Support  - 1 x daily - 3 x weekly - 2 sets - 10 reps - Standing Hip Extension with Resistance at Ankles and Counter Support  - 1 x daily - 3 x weekly - 2 sets - 10 reps - Wall Squat with Swiss Ball  - 1 x daily - 3 x weekly - 2 sets - 10 reps - Quadruped Alternating Leg Extensions  - 1 x daily - 3 x weekly - 2 sets - 10 reps - Squat with Chair Touch  - 1 x daily - 3 x weekly - 2 sets - 10 reps - Quarter Squat with Dumbbells  - 1 x daily - 3 x weekly -  2 sets - 10 reps - Standing Bent Over Single Arm Shoulder Row  - 1 x daily - 3 x weekly - 2 sets - 10 reps - Standing Partial Lunge  - 1 x daily - 3 x weekly - 2 sets - 10 reps  ASSESSMENT:  CLINICAL IMPRESSION: Tocara has progressed well throughout duration of care for her chronic low back/hip pain. She has been able to return to her previous workout regimen without issues and states her pain is mild and intermittent at this time. She demonstrates improvements in strength, ROM, and lifting mechanics having met all established functional goals. She demonstrates independence with advanced home program and is therefore appropriate for discharge at this time with patient in agreement with this plan.   EVAL: Patient is a 68 y.o. female who was seen today for physical therapy evaluation and treatment for chronic low back/posterior hip pain that has been ongoing since a fall in September with recent worsening over the past month. Upon assessment she is noted to have slight limitation into trunk extension and rotation, but pain free ROM in all planes. She exhibits core and gluteal weakness and aberrant squatting  mechanics. She will benefit from skilled PT to address the above stated deficits in order to optimize her function and assist in overall pain reduction.   OBJECTIVE IMPAIRMENTS: decreased activity tolerance, decreased knowledge of condition, difficulty walking, decreased ROM, decreased strength, improper body mechanics, postural dysfunction, and pain.   ACTIVITY LIMITATIONS: lifting, bending, squatting, and locomotion level  PARTICIPATION LIMITATIONS:  recreation  PERSONAL FACTORS: Age, Time since onset of injury/illness/exacerbation, and 1-2 comorbidities: osteoporosis, lymphoma  are also affecting patient's functional outcome.   REHAB POTENTIAL: Good  CLINICAL DECISION MAKING: Stable/uncomplicated  EVALUATION COMPLEXITY: Low   GOALS: Goals reviewed with patient? Yes  SHORT TERM GOALS: Target date: 10/09/2023   Patient will be independent and compliant with initial HEP.   Baseline: see above Goal status: MET  2.  Patient will demonstrate proper squat mechanics to reduce stress on her back with bending/lifting activity.  Baseline: see above  Goal status: MET    LONG TERM GOALS: Target date: 11/09/23  Patient will score at least 72% on FOTO to signify clinically meaningful improvement in functional abilities.   Baseline: see above Goal status: MET  2.  Patient will demonstrate 5/5 bilateral hip strength to improve overall lumbopelvic stability.  Baseline: see above Goal status: MET  3.  Patient will report pain at worst rated as </=2/10 to reduce current functional limitations.  Baseline: 5 Goal status: MET  4.  Patient will return to previous workout regimen without limitations as it relates to her back.  Baseline: not completing weightlifting  Goal status: MET   PLAN:  PT FREQUENCY: 1-2x/week  PT DURATION: 8 weeks  PLANNED INTERVENTIONS: 97164- PT Re-evaluation, 97110-Therapeutic exercises, 97530- Therapeutic activity, [id number]- Neuromuscular re-education,  97535- Self Care, 95284- Manual therapy, [id number]- Aquatic Therapy, [id number]- Traction (mechanical), and Cryotherapy.   PLAN FOR NEXT SESSION: N/A D/C   Letitia Libra, PT, DPT, ATC 10/10/23 9:28 AM

## 2023-10-14 ENCOUNTER — Ambulatory Visit: Payer: Medicare Other | Admitting: Family Medicine

## 2023-10-14 VITALS — BP 146/90 | HR 94 | Ht 61.75 in | Wt 109.0 lb

## 2023-10-14 DIAGNOSIS — M25552 Pain in left hip: Secondary | ICD-10-CM

## 2023-10-14 DIAGNOSIS — M545 Low back pain, unspecified: Secondary | ICD-10-CM | POA: Diagnosis not present

## 2023-10-14 DIAGNOSIS — M810 Age-related osteoporosis without current pathological fracture: Secondary | ICD-10-CM

## 2023-10-14 DIAGNOSIS — G8929 Other chronic pain: Secondary | ICD-10-CM

## 2023-10-14 NOTE — Patient Instructions (Signed)
Thank you for coming in today.   Glad you are feeling better.  Continue home exercises.  Check back as needed.

## 2023-10-14 NOTE — Progress Notes (Signed)
   Rubin Payor, PhD, LAT, ATC acting as a scribe for Clementeen Graham, MD.  Breanna Kirby is a 68 y.o. female who presents to [facility] at [facility] today for f/u L hip and low back pain. Pt was last seen by Dr. Denyse Amass on 09/02/23 and was advised to use an off-loader cushion, and was referred to PT, completing 8 visits (d/c last wk).  Today, pt reports L hip and low back pain are mostly resolved. She has been able to resume working out. She cont to work on [employer] on her own. She raves about her PT experience w/ Samantha.  Dx imaging: 09/02/23 L hip & L-spine XR  09/20/22 DEXA scan  Pertinent review of systems: No fevers or chills  Relevant historical information: Osteoporosis treated with bisphosphonate.  Follicular lymphoma.   Exam:  BP (!) 146/90   Pulse 94   Ht 5' 1.75" (1.568 m)   Wt 109 lb (49.4 kg)   SpO2 97%   BMI 20.10 kg/m  General: Well Developed, well nourished, and in no acute distress.   MSK: Normal lumbar and hip motion normal gait.       Assessment and Plan: 67 y.o. female with chronic hip and back pain improving with PT.  Plan to finish out PT and continue home exercise program.  Recheck back with me as needed.  Osteoporosis management with endocrinology at [facility] is appropriate.   PDMP not reviewed this encounter. No orders of the defined types were placed in this encounter.  No orders of the defined types were placed in this encounter.    Discussed warning signs or symptoms. Please see discharge instructions. Patient expresses understanding.   The above documentation has been reviewed and is accurate and complete Clementeen Graham, M.D.

## 2023-10-16 ENCOUNTER — Telehealth: Payer: Self-pay | Admitting: Family Medicine

## 2023-10-16 NOTE — Telephone Encounter (Signed)
Pt seen 10/14/2023 and reported her back pain had resolved. The past couple of days it is returned "with a vengeance" in the same area. She took gabapentin and ibuprofen but wanted Dr. Denyse Amass to know her pain has returned and seeking advise. Since she was just here I was unsure if I should schedule.

## 2023-10-17 ENCOUNTER — Ambulatory Visit: Payer: Medicare Other | Admitting: Family Medicine

## 2023-10-17 ENCOUNTER — Encounter: Payer: Self-pay | Admitting: Family Medicine

## 2023-10-17 ENCOUNTER — Other Ambulatory Visit: Payer: Self-pay

## 2023-10-17 VITALS — BP 156/108 | HR 100 | Ht 61.75 in | Wt 107.0 lb

## 2023-10-17 DIAGNOSIS — G8929 Other chronic pain: Secondary | ICD-10-CM

## 2023-10-17 DIAGNOSIS — M545 Low back pain, unspecified: Secondary | ICD-10-CM | POA: Diagnosis not present

## 2023-10-17 DIAGNOSIS — R1905 Periumbilic swelling, mass or lump: Secondary | ICD-10-CM

## 2023-10-17 MED ORDER — HYDROCODONE-ACETAMINOPHEN 5-325 MG PO TABS
1.0000 | ORAL_TABLET | Freq: Four times a day (QID) | ORAL | 0 refills | Status: DC | PRN
Start: 2023-10-17 — End: 2024-07-16

## 2023-10-17 NOTE — Telephone Encounter (Signed)
Pt called, scheduled appt today with Dr. Denyse Amass.

## 2023-10-17 NOTE — Telephone Encounter (Signed)
Okay to schedule with me.  We may want to consider an injection in your SI joint in clinic which I can do.

## 2023-10-17 NOTE — Patient Instructions (Addendum)
Thank you for coming in today.   You received an injection today. Seek immediate medical attention if the joint becomes red, extremely painful, or is oozing fluid.   Follow up with Oncology at [facility].   Let me know if you need anything.

## 2023-10-17 NOTE — Progress Notes (Signed)
I, Stevenson Clinch, CMA acting as a scribe for Clementeen Graham, MD.   Breanna Kirby is a 68 y.o. female who presents to [facility] at [facility] today for re-occurring L hip and low back pain. Pt was last seen by Dr. Denyse Amass on 10/14/23 and her pain was improving. Yesterday she called the office reporting pain had suddenly returned.  Today, pt reports lower back and sacral pain radiating into the hips and abd. Sx tend to be worse during the evenings. Denies new injury. Continued doing PT HEP. She was concerned do to her hx of follicular lymphoma.  Pt also noticed a lump on her abdomen, left of the navel, TTP.  She does have a history of follicular lymphoma that has been well-managed for quite some time.  She contacted her oncologist and will likely be scheduled to be seen soon.  Dx imaging: 09/02/23 L hip & L-spine XR             09/20/22 DEXA scan  Pertinent review of systems: No fevers or chills  Relevant historical information: Hypertension.  Follicular lymphoma managed by [facility]   Exam:  BP (!) 156/108   Pulse 100   Ht 5' 1.75" (1.568 m)   Wt 107 lb (48.5 kg)   SpO2 96%   BMI 19.73 kg/m  General: Well Developed, well nourished, and in no acute distress.   MSK: L-spine nontender palpation midline.  Tender palpation lumbar paraspinal musculature and SI joints bilaterally.  Normal lumbar motion.  Abdomen: Normal-appearing Soft and nontender.  Patient does have a palpable mass just lateral to the umbilicus.  It is partially mobile and not particularly tender to palpation.  Lab and Radiology Results  Diagnostic Limited MSK Ultrasound of: Left abdominal mass Heterogeneous circular/ovoid structure deep to the most superficial muscle layer measuring 1.7 x 1.4 x 0.9 centimeters.  There is vascular activity near the mass but not within the mass on Doppler. Impression: Concern large lymph node.  Does not appear consistent with lipoma.  Procedure: Real-time Ultrasound  Guided Injection of right SI joint Device: Philips Affiniti 50G/GE Logiq Images permanently stored and available for review in PACS Verbal informed consent obtained.  Discussed risks and benefits of procedure. Warned about infection, bleeding, hyperglycemia damage to structures among others. Patient expresses understanding and agreement Time-out conducted.   Noted no overlying erythema, induration, or other signs of local infection.   Skin prepped in a sterile fashion.   Local anesthesia: Topical Ethyl chloride.   With sterile technique and under real time ultrasound guidance: 40 mg of Kenalog and 2 ml of Marcaine injected into right SI joint. Fluid seen entering the joint cleft.   Completed without difficulty   Pain immediately resolved suggesting accurate placement of the medication.   Advised to call if fevers/chills, erythema, induration, drainage, or persistent bleeding.   Images permanently stored and available for review in the ultrasound unit.  Impression: Technically successful ultrasound guided injection.    Procedure: Real-time Ultrasound Guided Injection of left SI joint Device: Philips Affiniti 50G/GE Logiq Images permanently stored and available for review in PACS Verbal informed consent obtained.  Discussed risks and benefits of procedure. Warned about infection, bleeding, hyperglycemia damage to structures among others. Patient expresses understanding and agreement Time-out conducted.   Noted no overlying erythema, induration, or other signs of local infection.   Skin prepped in a sterile fashion.   Local anesthesia: Topical Ethyl chloride.   With sterile technique and under real time ultrasound  guidance: 40 mg of Kenalog and 2 mL of Marcaine injected into SI joint. Fluid seen entering the joint cleft.   Completed without difficulty   Pain immediately resolved suggesting accurate placement of the medication.   Advised to call if fevers/chills, erythema, induration,  drainage, or persistent bleeding.   Images permanently stored and available for review in the ultrasound unit.  Impression: Technically successful ultrasound guided injection.        Assessment and Plan: 68 y.o. female with exacerbation of chronic low back pain.  She was seen for this in November and December and had x-rays of her lumbar spine and hip that showed some degenerative changes but no aggressive appearing bony changes or fracture.  She had done quite well with some physical therapy and offloading.  Today her pain has worsened again located primarily in the SI joint region.  Plan for bilateral SI joint injections.  I did prescribe some hydrocodone that she can take if needed in addition to the over-the-counter medications that she already is taking.  Could use muscle relaxers as well if needed.  Additionally she has a nontender abdominal mass.  She does have a relevant medical history for long-term follicular lymphoma.  Per my interpretation of the in office ultrasound the mass looks to be consistent with enlarged lymph node.  However lymph nodes are outside of my area of expertise on musculoskeletal ultrasound and I would not consider my interpretation to be as reliable as a radiologist's interpretation.  I did contact the triage nurse at her oncology clinic [place] (number listed below) and provided my interpretation of the physical examination and ultrasound examination.  She has an appointment scheduled with her oncology clinic next Wednesday, October 22, 2022.  I expect she is probably going to be getting a PET CT scan which would be revealing for this issue.  Happy to provide further help for this including imaging locally if needed.  I provided my cell phone number to the triage nurse.  Providers can contact me with text messaging or phone call if needed prior to or following her visit.   PDMP reviewed during this encounter. Orders Placed This Encounter  Procedures   [place] LIMITED JOINT  SPACE STRUCTURES LOW BILAT(NO LINKED CHARGES)    Reason for Exam (SYMPTOM  OR DIAGNOSIS REQUIRED):   bilateral SI joint pain    Preferred imaging location?:   [facility]   Meds ordered this encounter  Medications   HYDROcodone-acetaminophen (NORCO/VICODIN) 5-325 MG tablet    Sig: Take 1 tablet by mouth every 6 (six) hours as needed.    Dispense:  15 tablet    Refill:  0     Discussed warning signs or symptoms. Please see discharge instructions. Patient expresses understanding.   The above documentation has been reviewed and is accurate and complete Clementeen Graham, M.D.   [facility]  [address]  [address]  [place], [place] [address]  [phone]

## 2023-10-18 ENCOUNTER — Telehealth: Payer: Self-pay | Admitting: Family Medicine

## 2023-10-18 NOTE — Telephone Encounter (Signed)
Okay to take up to 600 mg of gabapentin at bedtime.  This would be 6 pills of the 100 mg pills.  You could either take 2 of the hydrocodone if needed 2.

## 2023-10-18 NOTE — Telephone Encounter (Signed)
Pt took 1 Norco last night and it did not do well for her. She felt antsy and unable to sleep, did not help with pain.  Possible to increase gabapentin, as she tolerates that well?  Breanna Kirby

## 2023-10-20 ENCOUNTER — Encounter (HOSPITAL_COMMUNITY): Payer: Self-pay

## 2023-10-20 ENCOUNTER — Emergency Department (HOSPITAL_COMMUNITY)
Admission: EM | Admit: 2023-10-20 | Discharge: 2023-10-20 | Disposition: A | Discharge status: Home / Self Care | Payer: Medicare Other | Attending: Emergency Medicine | Admitting: Emergency Medicine

## 2023-10-20 ENCOUNTER — Other Ambulatory Visit: Payer: Self-pay

## 2023-10-20 ENCOUNTER — Emergency Department (HOSPITAL_COMMUNITY): Payer: Medicare Other

## 2023-10-20 DIAGNOSIS — K769 Liver disease, unspecified: Secondary | ICD-10-CM | POA: Insufficient documentation

## 2023-10-20 DIAGNOSIS — C787 Secondary malignant neoplasm of liver and intrahepatic bile duct: Secondary | ICD-10-CM | POA: Diagnosis not present

## 2023-10-20 DIAGNOSIS — Z8572 Personal history of non-Hodgkin lymphomas: Secondary | ICD-10-CM | POA: Diagnosis not present

## 2023-10-20 DIAGNOSIS — R590 Localized enlarged lymph nodes: Secondary | ICD-10-CM | POA: Insufficient documentation

## 2023-10-20 DIAGNOSIS — M549 Dorsalgia, unspecified: Secondary | ICD-10-CM | POA: Diagnosis not present

## 2023-10-20 DIAGNOSIS — G8929 Other chronic pain: Secondary | ICD-10-CM | POA: Diagnosis not present

## 2023-10-20 DIAGNOSIS — Z79899 Other long term (current) drug therapy: Secondary | ICD-10-CM | POA: Insufficient documentation

## 2023-10-20 DIAGNOSIS — M545 Low back pain, unspecified: Secondary | ICD-10-CM | POA: Diagnosis not present

## 2023-10-20 LAB — COMPREHENSIVE METABOLIC PANEL
ALT: 21 U/L (ref 0–44)
AST: 36 U/L (ref 15–41)
Albumin: 3.8 g/dL (ref 3.5–5.0)
Alkaline Phosphatase: 78 U/L (ref 38–126)
Anion gap: 11 (ref 5–15)
BUN: 16 mg/dL (ref 8–23)
CO2: 23 mmol/L (ref 22–32)
Calcium: 9.6 mg/dL (ref 8.9–10.3)
Chloride: 102 mmol/L (ref 98–111)
Creatinine, Ser: 0.79 mg/dL (ref 0.44–1.00)
GFR, Estimated: >60 mL/min (ref >60)
Glucose, Bld: 104 mg/dL — ABNORMAL HIGH (ref 70–99)
Potassium: 4.1 mmol/L (ref 3.5–5.1)
Sodium: 136 mmol/L (ref 135–145)
Total Bilirubin: 0.7 mg/dL (ref 0.0–1.2)
Total Protein: 6.4 g/dL — ABNORMAL LOW (ref 6.5–8.1)

## 2023-10-20 LAB — CBC WITH DIFFERENTIAL/PLATELET
Abs Immature Granulocytes: 0.02 10*3/uL (ref 0.00–0.07)
Basophils Absolute: 0 10*3/uL (ref 0.0–0.1)
Basophils Relative: 0 %
Eosinophils Absolute: 0 10*3/uL (ref 0.0–0.5)
Eosinophils Relative: 0 %
HCT: 40.7 % (ref 36.0–46.0)
Hemoglobin: 13.6 g/dL (ref 12.0–15.0)
Immature Granulocytes: 0 %
Lymphocytes Relative: 12 %
Lymphs Abs: 0.8 10*3/uL (ref 0.7–4.0)
MCH: 30 pg (ref 26.0–34.0)
MCHC: 33.4 g/dL (ref 30.0–36.0)
MCV: 89.8 fL (ref 80.0–100.0)
Monocytes Absolute: 0.5 10*3/uL (ref 0.1–1.0)
Monocytes Relative: 8 %
Neutro Abs: 5.1 10*3/uL (ref 1.7–7.7)
Neutrophils Relative %: 80 %
Platelets: 260 10*3/uL (ref 150–400)
RBC: 4.53 MIL/uL (ref 3.87–5.11)
RDW: 12.4 % (ref 11.5–15.5)
WBC: 6.4 10*3/uL (ref 4.0–10.5)
nRBC: 0 % (ref 0.0–0.2)

## 2023-10-20 MED ORDER — IOHEXOL 350 MG/ML SOLN
75.0000 mL | Freq: Once | INTRAVENOUS | Status: AC | PRN
  Administered 2023-10-20: 75 mL via INTRAVENOUS

## 2023-10-20 MED ORDER — CYCLOBENZAPRINE HCL 10 MG PO TABS
10.0000 mg | ORAL_TABLET | Freq: Once | ORAL | Status: AC
  Administered 2023-10-20: 10 mg via ORAL
  Filled 2023-10-20: qty 1

## 2023-10-20 MED ORDER — IBUPROFEN 400 MG PO TABS
600.0000 mg | ORAL_TABLET | Freq: Once | ORAL | Status: AC
  Administered 2023-10-20: 600 mg via ORAL
  Filled 2023-10-20: qty 1

## 2023-10-20 MED ORDER — LIDOCAINE 5 % EX PTCH
1.0000 | MEDICATED_PATCH | Freq: Once | CUTANEOUS | Status: DC
  Administered 2023-10-20: 1 via TRANSDERMAL
  Filled 2023-10-20: qty 1

## 2023-10-20 MED ORDER — OXYCODONE HCL 5 MG PO TABS
5.0000 mg | ORAL_TABLET | Freq: Four times a day (QID) | ORAL | 0 refills | Status: DC | PRN
Start: 2023-10-20 — End: 2023-10-24

## 2023-10-20 MED ORDER — CYCLOBENZAPRINE HCL 10 MG PO TABS: 10.0000 mg | ORAL_TABLET | Freq: Two times a day (BID) | ORAL | 0 refills | Status: DC | PRN

## 2023-10-20 MED ORDER — LIDOCAINE 5 % EX PTCH: 1.0000 | MEDICATED_PATCH | CUTANEOUS | 0 refills | Status: AC

## 2023-10-20 MED ORDER — OXYCODONE-ACETAMINOPHEN 5-325 MG PO TABS
1.0000 | ORAL_TABLET | Freq: Once | ORAL | Status: AC
  Administered 2023-10-20: 1 via ORAL
  Filled 2023-10-20: qty 1

## 2023-10-20 NOTE — ED Notes (Signed)
Patient transported to CT

## 2023-10-20 NOTE — ED Provider Triage Note (Signed)
Emergency Medicine Provider Triage Evaluation Note  Breanna Kirby , a 68 y.o. female  was evaluated in triage.  Pt complains of low back pain and generalized abdominal pain. Hx of lymphoma w/ known mass in abdomen. Currently being seen by [facility]. Has an appointment scheduled for Saturday for a CT abdomen. Has had 2 cortisone injection last week.  Pain became so bad that she began vomiting.   Denies headaches, blurry vision, chest pain, shortness of breath, diarrhea, constipation, dysuria, weakness, incontinence, numbness/tingling  Review of Systems  Positive: See above Negative: See above  Physical Exam  BP (!) 167/84 (BP Location: Right Arm)   Pulse (!) 105   Temp (!) 97.3 F (36.3 C)   Resp 18   Ht 5' 1.75" (1.568 m)   Wt 48.5 kg   SpO2 97%   BMI 19.73 kg/m  Gen:   Awake, no distress   Resp:  Normal effort  MSK:   Moves extremities without difficulty  Other:    Medical Decision Making  Medically screening exam initiated at 12:15 PM.  Appropriate orders placed.  Breanna Kirby was informed that the remainder of the evaluation will be completed by another provider, this initial triage assessment does not replace that evaluation, and the importance of remaining in the ED until their evaluation is complete.     Breanna Kirby, [place] 10/20/23 1221

## 2023-10-20 NOTE — Discharge Instructions (Signed)
You were seen in the emergency department for your back pain.  Your workup did unfortunately show a lot of enlarged lymph nodes in your abdomen including in what is called the retroperitoneal area, on your mesentery and along your left iliac region of your abdomen.  There was also a lesion in your liver.  This is concerning for cancerous findings that may be causing your pain.  You can take Tylenol and Motrin every 6 hours as needed for pain and you can take oxycodone instead of the hydrocodone for breakthrough pain.  This can make you drowsy so do not take before driving, working or operating heavy machinery.  I have also given you Flexeril which is a muscle relaxer.  This can also make you drowsy so I recommend spacing it out by few hours with the oxycodone as well.  You should follow-up with your oncologist in the next few days for further workup and evaluation.  You should return to the emergency department if you are having significantly worsening pain, develop numbness or weakness in your legs, you are unable to walk, you are unable to urinate or if you have any other new or concerning symptoms.

## 2023-10-20 NOTE — ED Triage Notes (Signed)
Patient has hx of lymphoma and has been having severe lower back pain x 6 weeks and has been seeing Dr Denyse Amass at [facility] had had PT and steroid injections but over the  past 5 days has got progressively worse and saw Dr Denyse Amass and identified a mass in abdomen.  Dr Denyse Amass called [facility] which is where her cancer treatment is and wanted her to have PET scan but soonest they could get is not until next satruday.  Patient reports pain is so severe she isnt able to sleep. Denies numbness or tingling to extremities.  Reports pain is worse at night. n

## 2023-10-20 NOTE — ED Provider Notes (Signed)
[place] EMERGENCY DEPARTMENT AT [facility] Provider Note   CSN: [id number] Arrival date & time: 10/20/23  1147     History  Chief Complaint  Patient presents with   Back Pain   Abdominal Pain    Breanna Kirby is a 68 y.o. female.  Patient is a 68 year old female with past medical history of follicular lymphoma not currently on any chemo or radiation presenting to the emergency department with back pain.  The patient states that she has had lower back pain for about the past 6 weeks.  She states that she did have a fall in November but had x-rays done by her doctor at that time was normal and this pain feels unrelated.  She states that she initially saw her sports medicine doctor who started her in physical therapy.  She states that she did not have any pain with therapy but would tend to have more pain at night.  She states that despite doing therapy the pain got worse.  She states that her sports medicine doctor started her on gabapentin and also did a cortisone injection 2 days ago.  The patient states that over the last 2 days she is also started to develop a discomfort across her lower abdomen and felt a lump on her abdomen.  She states that her sports medicine doctor evaluated with ultrasound and said that this was concerning for a possible lymph node and she has scheduled follow-up with her oncologist next week for CT scans.  She states that despite increasing her gabapentin dose and taking Norco she has had uncontrolled pain and was recommended to come to the emergency department.  She denies any fevers, numbness or weakness, loss of bowel or bladder function, dysuria or hematuria.  The history is provided by the patient and the spouse.  Back Pain Associated symptoms: abdominal pain   Abdominal Pain      Home Medications Prior to Admission medications   Medication Sig Start Date End Date Taking? Authorizing Provider  cyclobenzaprine (FLEXERIL) 10 MG tablet Take 1  tablet (10 mg total) by mouth 2 (two) times daily as needed for muscle spasms. 10/20/23  Yes Theresia Lo, [place] K, DO  lidocaine (LIDODERM) 5 % Place 1 patch onto the skin daily. Remove & Discard patch within 12 hours or as directed by MD 10/20/23  Yes Theresia Lo, Benetta Spar K, DO  oxyCODONE (ROXICODONE) 5 MG immediate release tablet Take 1 tablet (5 mg total) by mouth every 6 (six) hours as needed. 10/20/23  Yes Theresia Lo, [place] K, DO  ALPRAZolam Prudy Feeler) 0.25 MG tablet     [provider]  amLODipine (NORVASC) 2.5 MG tablet Take 1 tablet by mouth daily.    [provider]  B Complex Vitamins (VITAMIN B COMPLEX) TABS     [provider]  Cholecalciferol 125 MCG (5000 UT) TABS Take by mouth.    [provider]  COVID-19 mRNA vaccine, Moderna, 100 MCG/0.5ML injection Inject into the muscle. 01/23/21   Judyann Munson, MD  cyanocobalamin (VITAMIN B12) 1000 MCG tablet 1 tablet Orally Once a day    [provider]  gabapentin (NEURONTIN) 100 MG capsule Take 1-3 capsules (100-300 mg total) by mouth 3 (three) times daily as needed. 06/12/23   Rodolph Bong, MD  HYDROcodone-acetaminophen (NORCO/VICODIN) 5-325 MG tablet Take 1 tablet by mouth every 6 (six) hours as needed. 10/17/23   Rodolph Bong, MD  influenza vac split quadrivalent PF (FLUARIX) 0.5 ML injection Inject into the  muscle. 06/28/21   Judyann Munson, MD  influenza vac split quadrivalent PF (FLUARIX) 0.5 ML injection Inject into the muscle. 07/16/22   Judyann Munson, MD  L-Lysine 500 MG TABS as directed Orally    [provider]  Magnesium Glycinate 100 MG CAPS as directed Orally    [provider]  Menaquinone-7 (VITAMIN K2) 100 MCG CAPS as directed 100-300 mcg Orally daily 12/17/22   [provider]  Multiple Vitamin (MULTIVITAMIN WITH MINERALS) TABS tablet Take 1 tablet by mouth daily.    [provider]  Omega 3-6-9 Fatty Acids (SUPER OMEGA-3) CAPS as directed Orally     [provider]  PARoxetine (PAXIL) 10 MG tablet Take 10 mg by mouth daily.    [provider]  predniSONE (DELTASONE) 50 MG tablet Take 1 tablet (50 mg total) by mouth daily. 06/12/23   Rodolph Bong, MD  Theanine 200 MG CAPS as directed Orally    [provider]  VITAMIN D PO Take 1 tablet by mouth daily.    [provider]      Allergies    Alendronate and Amoxicillin-pot clavulanate    Review of Systems   Review of Systems  Gastrointestinal:  Positive for abdominal pain.  Musculoskeletal:  Positive for back pain.    Physical Exam Updated Vital Signs BP (!) 158/78 (BP Location: Right Arm)   Pulse 98   Temp 97.6 F (36.4 C) (Oral)   Resp 17   Ht 5' 1.75" (1.568 m)   Wt 48.5 kg   SpO2 99%   BMI 19.73 kg/m  Physical Exam Vitals and nursing note reviewed.  Constitutional:      General: She is not in acute distress.    Appearance: She is well-developed.  HENT:     Head: Normocephalic.     Mouth/Throat:     Mouth: Mucous membranes are moist.  Eyes:     Extraocular Movements: Extraocular movements intact.  Cardiovascular:     Rate and Rhythm: Normal rate and regular rhythm.     Heart sounds: Normal heart sounds.  Pulmonary:     Effort: Pulmonary effort is normal.     Breath sounds: Normal breath sounds.  Abdominal:     General: Abdomen is flat.     Palpations: Abdomen is soft.     Tenderness: There is abdominal tenderness (Minimal across lower abdomen). There is no right CVA tenderness, left CVA tenderness, guarding or rebound.  Musculoskeletal:     Comments: No midline back tenderness, mild lumbar paraspinal muscle tenderness to palpation  Skin:    General: Skin is warm and dry.  Neurological:     General: No focal deficit present.     Mental Status: She is alert and oriented to person, place, and time.     Motor: No weakness.     Comments: 5 out of 5 strength in bilateral hip flexion, knee extension and plantar/dorsi flexion,  sensation intact in bilateral lower extremities  Psychiatric:        Mood and Affect: Mood normal.        Behavior: Behavior normal.     ED Results / Procedures / Treatments   Labs (all labs ordered are listed, but only abnormal results are displayed) Labs Reviewed  COMPREHENSIVE METABOLIC PANEL - Abnormal; Notable for the following components:      Result Value   Glucose, Bld 104 (*)    Total Protein 6.4 (*)    All other components within normal limits  CBC WITH DIFFERENTIAL/PLATELET    EKG None  Radiology CT L-SPINE NO CHARGE Result Date: 10/20/2023 CLINICAL DATA:  Chronic back pain, palpable mass, metastatic disease evaluation * Tracking Code: BO * EXAM: CT CHEST, ABDOMEN, AND PELVIS WITH CONTRAST CT THORACIC AND LUMBAR SPINE WITH CONTRAST TECHNIQUE: Multidetector CT imaging of the chest, abdomen and pelvis was performed following the standard protocol during bolus administration of intravenous contrast. Multidetector CT imaging of the thoracic and lumbar spine was performed following the standard protocol during bolus administration of intravenous contrast. RADIATION DOSE REDUCTION: This exam was performed according to the departmental dose-optimization program which includes automated exposure control, adjustment of the mA and/or kV according to patient size and/or use of iterative reconstruction technique. CONTRAST:  75mL OMNIPAQUE IOHEXOL 350 MG/ML SOLN COMPARISON:  None Available. FINDINGS: CT CHEST FINDINGS Cardiovascular: No significant vascular findings. Normal heart size. Left coronary artery calcifications. No pericardial effusion. Mediastinum/Nodes: No enlarged mediastinal, hilar, or axillary lymph nodes. Thyroid gland, trachea, and esophagus demonstrate no significant findings. Lungs/Pleura: Minimal bibasilar scarring or atelectasis. No pleural effusion or pneumothorax. Musculoskeletal: No chest wall mass or suspicious osseous lesions identified. CT ABDOMEN PELVIS FINDINGS  Hepatobiliary: Hypodense mass of the liver dome, hepatic segment VIII measuring 1.6 x 1.5 cm (series 3, image 8). No gallstones, gallbladder wall thickening, or biliary dilatation. Pancreas: Unremarkable. No pancreatic ductal dilatation or surrounding inflammatory changes. Spleen: Normal in size without significant abnormality. Adrenals/Urinary Tract: Adrenal glands are normal. Kidneys are normal, without renal calculi, solid lesion, or hydronephrosis. Bladder is unremarkable. Stomach/Bowel: Stomach is within normal limits. Appendix appears normal. No evidence of bowel wall thickening, distention, or inflammatory changes. Moderate burden of stool throughout the colon and rectum. Vascular/Lymphatic: Scattered aortic atherosclerosis. Bulky mesenteric, retroperitoneal, and left iliac lymph nodes, largest mesenteric node in the left hemiabdomen measuring 5.8 x 5.3 cm (series 4, image 78), large low left retroperitoneal/iliac node measuring 5.7 x 4.8 cm (series 4, image 87). Reproductive: No mass or other abnormality. Other: No abdominal wall hernia or abnormality. Trace free fluid in the low pelvis. Musculoskeletal: No acute osseous findings. CT THORACIC AND LUMBAR SPINE FINDINGS Alignment: Normal thoracic kyphosis. Normal lumbar lordosis. Vertebral bodies: Intact. No fracture or dislocation. Disc spaces: Mild disc space height loss and osteophytosis throughout the thoracic and lumbar spine, worst at L5-S1. Paraspinous soft tissues: Unremarkable. IMPRESSION: 1. Bulky hypodense mesenteric, retroperitoneal, and left iliac lymph nodes. 2. Hypodense metastasis of the liver dome. 3. Constellation of findings consistent with metastatic disease, without obvious primary in the chest, abdomen, or pelvis. Most likely general differential considerations include colon and reproductive primary. 4. No evidence of lymphadenopathy or metastatic disease in the chest. 5. No fracture or dislocation of the thoracic or lumbar spine. Mild  disc space height loss and osteophytosis throughout the thoracic and lumbar spine, worst at L5-S1. No obvious osseous metastatic disease by CT; MRI may offer additional sensitivity for osseous metastatic disease. 6. Coronary artery disease. Aortic Atherosclerosis (ICD10-I70.0). Electronically Signed   By: Jearld Lesch M.D.   On: 10/20/2023 16:25   CT T-SPINE NO CHARGE Result Date: 10/20/2023 CLINICAL DATA:  Chronic back pain, palpable mass, metastatic disease evaluation * Tracking Code: BO * EXAM: CT CHEST, ABDOMEN, AND PELVIS WITH CONTRAST CT THORACIC AND LUMBAR SPINE WITH CONTRAST TECHNIQUE: Multidetector CT imaging of the chest, abdomen and pelvis was performed following the standard protocol during bolus administration of intravenous contrast. Multidetector CT imaging of the thoracic and lumbar spine was performed following the standard protocol during bolus  administration of intravenous contrast. RADIATION DOSE REDUCTION: This exam was performed according to the departmental dose-optimization program which includes automated exposure control, adjustment of the mA and/or kV according to patient size and/or use of iterative reconstruction technique. CONTRAST:  75mL OMNIPAQUE IOHEXOL 350 MG/ML SOLN COMPARISON:  None Available. FINDINGS: CT CHEST FINDINGS Cardiovascular: No significant vascular findings. Normal heart size. Left coronary artery calcifications. No pericardial effusion. Mediastinum/Nodes: No enlarged mediastinal, hilar, or axillary lymph nodes. Thyroid gland, trachea, and esophagus demonstrate no significant findings. Lungs/Pleura: Minimal bibasilar scarring or atelectasis. No pleural effusion or pneumothorax. Musculoskeletal: No chest wall mass or suspicious osseous lesions identified. CT ABDOMEN PELVIS FINDINGS Hepatobiliary: Hypodense mass of the liver dome, hepatic segment VIII measuring 1.6 x 1.5 cm (series 3, image 8). No gallstones, gallbladder wall thickening, or biliary dilatation.  Pancreas: Unremarkable. No pancreatic ductal dilatation or surrounding inflammatory changes. Spleen: Normal in size without significant abnormality. Adrenals/Urinary Tract: Adrenal glands are normal. Kidneys are normal, without renal calculi, solid lesion, or hydronephrosis. Bladder is unremarkable. Stomach/Bowel: Stomach is within normal limits. Appendix appears normal. No evidence of bowel wall thickening, distention, or inflammatory changes. Moderate burden of stool throughout the colon and rectum. Vascular/Lymphatic: Scattered aortic atherosclerosis. Bulky mesenteric, retroperitoneal, and left iliac lymph nodes, largest mesenteric node in the left hemiabdomen measuring 5.8 x 5.3 cm (series 4, image 78), large low left retroperitoneal/iliac node measuring 5.7 x 4.8 cm (series 4, image 87). Reproductive: No mass or other abnormality. Other: No abdominal wall hernia or abnormality. Trace free fluid in the low pelvis. Musculoskeletal: No acute osseous findings. CT THORACIC AND LUMBAR SPINE FINDINGS Alignment: Normal thoracic kyphosis. Normal lumbar lordosis. Vertebral bodies: Intact. No fracture or dislocation. Disc spaces: Mild disc space height loss and osteophytosis throughout the thoracic and lumbar spine, worst at L5-S1. Paraspinous soft tissues: Unremarkable. IMPRESSION: 1. Bulky hypodense mesenteric, retroperitoneal, and left iliac lymph nodes. 2. Hypodense metastasis of the liver dome. 3. Constellation of findings consistent with metastatic disease, without obvious primary in the chest, abdomen, or pelvis. Most likely general differential considerations include colon and reproductive primary. 4. No evidence of lymphadenopathy or metastatic disease in the chest. 5. No fracture or dislocation of the thoracic or lumbar spine. Mild disc space height loss and osteophytosis throughout the thoracic and lumbar spine, worst at L5-S1. No obvious osseous metastatic disease by CT; MRI may offer additional sensitivity  for osseous metastatic disease. 6. Coronary artery disease. Aortic Atherosclerosis (ICD10-I70.0). Electronically Signed   By: Jearld Lesch M.D.   On: 10/20/2023 16:25   CT CHEST ABDOMEN PELVIS W CONTRAST Result Date: 10/20/2023 CLINICAL DATA:  Chronic back pain, palpable mass, metastatic disease evaluation * Tracking Code: BO * EXAM: CT CHEST, ABDOMEN, AND PELVIS WITH CONTRAST CT THORACIC AND LUMBAR SPINE WITH CONTRAST TECHNIQUE: Multidetector CT imaging of the chest, abdomen and pelvis was performed following the standard protocol during bolus administration of intravenous contrast. Multidetector CT imaging of the thoracic and lumbar spine was performed following the standard protocol during bolus administration of intravenous contrast. RADIATION DOSE REDUCTION: This exam was performed according to the departmental dose-optimization program which includes automated exposure control, adjustment of the mA and/or kV according to patient size and/or use of iterative reconstruction technique. CONTRAST:  75mL OMNIPAQUE IOHEXOL 350 MG/ML SOLN COMPARISON:  None Available. FINDINGS: CT CHEST FINDINGS Cardiovascular: No significant vascular findings. Normal heart size. Left coronary artery calcifications. No pericardial effusion. Mediastinum/Nodes: No enlarged mediastinal, hilar, or axillary lymph nodes. Thyroid gland, trachea, and esophagus demonstrate no  significant findings. Lungs/Pleura: Minimal bibasilar scarring or atelectasis. No pleural effusion or pneumothorax. Musculoskeletal: No chest wall mass or suspicious osseous lesions identified. CT ABDOMEN PELVIS FINDINGS Hepatobiliary: Hypodense mass of the liver dome, hepatic segment VIII measuring 1.6 x 1.5 cm (series 3, image 8). No gallstones, gallbladder wall thickening, or biliary dilatation. Pancreas: Unremarkable. No pancreatic ductal dilatation or surrounding inflammatory changes. Spleen: Normal in size without significant abnormality. Adrenals/Urinary Tract:  Adrenal glands are normal. Kidneys are normal, without renal calculi, solid lesion, or hydronephrosis. Bladder is unremarkable. Stomach/Bowel: Stomach is within normal limits. Appendix appears normal. No evidence of bowel wall thickening, distention, or inflammatory changes. Moderate burden of stool throughout the colon and rectum. Vascular/Lymphatic: Scattered aortic atherosclerosis. Bulky mesenteric, retroperitoneal, and left iliac lymph nodes, largest mesenteric node in the left hemiabdomen measuring 5.8 x 5.3 cm (series 4, image 78), large low left retroperitoneal/iliac node measuring 5.7 x 4.8 cm (series 4, image 87). Reproductive: No mass or other abnormality. Other: No abdominal wall hernia or abnormality. Trace free fluid in the low pelvis. Musculoskeletal: No acute osseous findings. CT THORACIC AND LUMBAR SPINE FINDINGS Alignment: Normal thoracic kyphosis. Normal lumbar lordosis. Vertebral bodies: Intact. No fracture or dislocation. Disc spaces: Mild disc space height loss and osteophytosis throughout the thoracic and lumbar spine, worst at L5-S1. Paraspinous soft tissues: Unremarkable. IMPRESSION: 1. Bulky hypodense mesenteric, retroperitoneal, and left iliac lymph nodes. 2. Hypodense metastasis of the liver dome. 3. Constellation of findings consistent with metastatic disease, without obvious primary in the chest, abdomen, or pelvis. Most likely general differential considerations include colon and reproductive primary. 4. No evidence of lymphadenopathy or metastatic disease in the chest. 5. No fracture or dislocation of the thoracic or lumbar spine. Mild disc space height loss and osteophytosis throughout the thoracic and lumbar spine, worst at L5-S1. No obvious osseous metastatic disease by CT; MRI may offer additional sensitivity for osseous metastatic disease. 6. Coronary artery disease. Aortic Atherosclerosis (ICD10-I70.0). Electronically Signed   By: Jearld Lesch M.D.   On: 10/20/2023 16:25     Procedures Procedures    Medications Ordered in ED Medications  lidocaine (LIDODERM) 5 % 1-3 patch (1 patch Transdermal Patch Applied 10/20/23 1639)  iohexol (OMNIPAQUE) 350 MG/ML injection 75 mL (75 mLs Intravenous Contrast Given 10/20/23 1559)  ibuprofen (ADVIL) tablet 600 mg (600 mg Oral Given 10/20/23 1638)  cyclobenzaprine (FLEXERIL) tablet 10 mg (10 mg Oral Given 10/20/23 1638)  oxyCODONE-acetaminophen (PERCOCET/ROXICET) 5-325 MG per tablet 1 tablet (1 tablet Oral Given 10/20/23 1637)    ED Course/ Medical Decision Making/ A&P Clinical Course as of 10/20/23 Everrett Coombe Oct 20, 2023  1702 CT shows evidence of new mesenteric, retroperitoneal and left iliac lymph nodes with likely metastasis in the liver, unclear primary etiology.  No bony lesions. [VK]    Clinical Course User Index [VK] Rexford Maus, DO                                 Medical Decision Making This patient presents to the ED with chief complaint(s) of back pain with pertinent past medical history of follicular lymphoma which further complicates the presenting complaint. The complaint involves an extensive differential diagnosis and also carries with it a high risk of complications and morbidity.    The differential diagnosis includes muscle strain or spasm, lytic lesion or cancer pain, no radiation down her legs making sciatica unlikely, no neurologic deficits, loss of bowel  or bladder function making cauda equina unlikely  Additional history obtained: Additional history obtained from spouse Records reviewed Care Everywhere/External Records and sports medicine records  ED Course and Reassessment: On patient's arrival she was hemodynamically stable in no acute distress.  She was initially evaluated in triage and had labs and CT imaging ordered.  Patient's labs are within normal range.  CT is pending at this time.  She states that she is having tolerable pain at this time and initially declined any pain  control.  Independent labs interpretation:  The following labs were independently interpreted: within normal range  Independent visualization of imaging: - I independently visualized the following imaging with scope of interpretation limited to determining acute life threatening conditions related to emergency care: CTCAP, CT lumbar/thoracic spine, which revealed no bony abnormality, retroperitoneal, mesenteric and left iliac lymph nodes, suspicious for mets in the liver  Consultation: - Consulted or discussed management/test interpretation w/ external professional: N/A  Consideration for admission or further workup: Patient has no emergent conditions requiring admission or further work-up at this time and is stable for discharge home with primary care and oncology follow-up  Social Determinants of health: n/A    Risk Prescription drug management.          Final Clinical Impression(s) / ED Diagnoses Final diagnoses:  Retroperitoneal lymphadenopathy  Mesenteric lymphadenopathy  Liver lesion    Rx / DC Orders ED Discharge Orders          Ordered    oxyCODONE (ROXICODONE) 5 MG immediate release tablet  Every 6 hours PRN        10/20/23 1835    lidocaine (LIDODERM) 5 %  Every 24 hours        10/20/23 1835    cyclobenzaprine (FLEXERIL) 10 MG tablet  2 times daily PRN        10/20/23 1835              Rexford Maus, DO 10/20/23 Paulo Fruit

## 2023-10-21 ENCOUNTER — Telehealth: Payer: Self-pay | Admitting: Family Medicine

## 2023-10-21 DIAGNOSIS — K769 Liver disease, unspecified: Secondary | ICD-10-CM | POA: Diagnosis not present

## 2023-10-21 DIAGNOSIS — C8218 Follicular lymphoma grade II, lymph nodes of multiple sites: Secondary | ICD-10-CM | POA: Diagnosis not present

## 2023-10-21 NOTE — Telephone Encounter (Signed)
I received a voicemail from the patient's husband from over the weekend stating that her pain has continued to get worse and at this point he felt he needed to take her to the emergency room.   Just FYI.

## 2023-10-21 NOTE — Telephone Encounter (Signed)
I saw Breanna Kirby's ER notes. I called her. Her pain is better controlled with Oxycodone and flexeril.  She will f/u with [facility] oncology shortly.  She had CT scans that did show extension of her follicular lymphoma but no metastasis to the bone in the region of pain.  Happy to refill medicines if needed or provide what ever care is needed until she can get out with her [facility] oncologist's.  She did reach out and leave a message with the triage nurse.

## 2023-10-24 ENCOUNTER — Telehealth: Payer: Self-pay | Admitting: Family Medicine

## 2023-10-24 DIAGNOSIS — C8218 Follicular lymphoma grade II, lymph nodes of multiple sites: Secondary | ICD-10-CM | POA: Diagnosis not present

## 2023-10-24 DIAGNOSIS — R1906 Epigastric swelling, mass or lump: Secondary | ICD-10-CM | POA: Diagnosis not present

## 2023-10-24 MED ORDER — OXYCODONE HCL 5 MG PO TABS: 5.0000 mg | ORAL_TABLET | Freq: Four times a day (QID) | ORAL | 0 refills | Status: DC | PRN

## 2023-10-24 NOTE — Telephone Encounter (Signed)
Patient called asking if Dr Denyse Amass would be able to refill the oxyCODONE (ROXICODONE) 5 MG immediate release tablet that she was given when she went to the hospital. Dr Denyse Amass had previously prescribed Hydrocodone but she was not able to tolerate it.  She asked if Dr Denyse Amass would be able to prescribed enough to get her through the weekend?  Please advise.

## 2023-10-24 NOTE — Telephone Encounter (Signed)
Oxycodone prescribed to [facility].

## 2023-10-24 NOTE — Telephone Encounter (Signed)
Called pt and left VM advising that requested RX had been sent to [name] and to call with any questions or concerns.

## 2023-10-25 DIAGNOSIS — R109 Unspecified abdominal pain: Secondary | ICD-10-CM | POA: Diagnosis not present

## 2023-10-25 DIAGNOSIS — R948 Abnormal results of function studies of other organs and systems: Secondary | ICD-10-CM | POA: Diagnosis not present

## 2023-10-25 DIAGNOSIS — C8218 Follicular lymphoma grade II, lymph nodes of multiple sites: Secondary | ICD-10-CM | POA: Diagnosis not present

## 2023-10-29 DIAGNOSIS — Z01812 Encounter for preprocedural laboratory examination: Secondary | ICD-10-CM | POA: Diagnosis not present

## 2023-10-29 DIAGNOSIS — R948 Abnormal results of function studies of other organs and systems: Secondary | ICD-10-CM | POA: Diagnosis not present

## 2023-10-29 DIAGNOSIS — C8218 Follicular lymphoma grade II, lymph nodes of multiple sites: Secondary | ICD-10-CM | POA: Diagnosis not present

## 2023-10-30 ENCOUNTER — Telehealth: Payer: Self-pay | Admitting: Family Medicine

## 2023-10-30 NOTE — Telephone Encounter (Signed)
 Her husband saw me today and asked me about her pain control. She is hurting a lot.  OK to maximize tylenol  arthritis. Breanna Kirby to increase gabapentin  to 300mg  TID in addition to the oxycodone .

## 2023-11-01 DIAGNOSIS — I10 Essential (primary) hypertension: Secondary | ICD-10-CM | POA: Diagnosis not present

## 2023-11-01 DIAGNOSIS — C8218 Follicular lymphoma grade II, lymph nodes of multiple sites: Secondary | ICD-10-CM | POA: Diagnosis not present

## 2023-11-01 DIAGNOSIS — R948 Abnormal results of function studies of other organs and systems: Secondary | ICD-10-CM | POA: Diagnosis not present

## 2023-11-05 DIAGNOSIS — Z1159 Encounter for screening for other viral diseases: Secondary | ICD-10-CM | POA: Diagnosis not present

## 2023-11-05 DIAGNOSIS — Z86718 Personal history of other venous thrombosis and embolism: Secondary | ICD-10-CM | POA: Diagnosis not present

## 2023-11-05 DIAGNOSIS — Z79899 Other long term (current) drug therapy: Secondary | ICD-10-CM | POA: Diagnosis not present

## 2023-11-05 DIAGNOSIS — C8218 Follicular lymphoma grade II, lymph nodes of multiple sites: Secondary | ICD-10-CM | POA: Diagnosis not present

## 2023-11-06 DIAGNOSIS — Z5111 Encounter for antineoplastic chemotherapy: Secondary | ICD-10-CM | POA: Diagnosis not present

## 2023-11-06 DIAGNOSIS — Z79899 Other long term (current) drug therapy: Secondary | ICD-10-CM | POA: Diagnosis not present

## 2023-11-19 DIAGNOSIS — D225 Melanocytic nevi of trunk: Secondary | ICD-10-CM | POA: Diagnosis not present

## 2023-11-19 DIAGNOSIS — L578 Other skin changes due to chronic exposure to nonionizing radiation: Secondary | ICD-10-CM | POA: Diagnosis not present

## 2023-11-19 DIAGNOSIS — L821 Other seborrheic keratosis: Secondary | ICD-10-CM | POA: Diagnosis not present

## 2023-11-19 DIAGNOSIS — L723 Sebaceous cyst: Secondary | ICD-10-CM | POA: Diagnosis not present

## 2023-11-29 DIAGNOSIS — Z5112 Encounter for antineoplastic immunotherapy: Secondary | ICD-10-CM | POA: Diagnosis not present

## 2023-11-29 DIAGNOSIS — Z5111 Encounter for antineoplastic chemotherapy: Secondary | ICD-10-CM | POA: Diagnosis not present

## 2023-11-29 DIAGNOSIS — Z79632 Long term (current) use of antitumor antibiotic: Secondary | ICD-10-CM | POA: Diagnosis not present

## 2023-11-29 DIAGNOSIS — C8218 Follicular lymphoma grade II, lymph nodes of multiple sites: Secondary | ICD-10-CM | POA: Diagnosis not present

## 2023-11-29 DIAGNOSIS — Z79630 Long term (current) use of alkylating agent: Secondary | ICD-10-CM | POA: Diagnosis not present

## 2023-11-29 DIAGNOSIS — Z79631 Long term (current) use of antimetabolite agent: Secondary | ICD-10-CM | POA: Diagnosis not present

## 2023-12-18 ENCOUNTER — Encounter: Payer: Self-pay | Admitting: Family Medicine

## 2023-12-20 DIAGNOSIS — Z5112 Encounter for antineoplastic immunotherapy: Secondary | ICD-10-CM | POA: Diagnosis not present

## 2023-12-20 DIAGNOSIS — Z79633 Long term (current) use of mitotic inhibitor: Secondary | ICD-10-CM | POA: Diagnosis not present

## 2023-12-20 DIAGNOSIS — Z79620 Long term (current) use of immunosuppressive biologic: Secondary | ICD-10-CM | POA: Diagnosis not present

## 2023-12-20 DIAGNOSIS — C8218 Follicular lymphoma grade II, lymph nodes of multiple sites: Secondary | ICD-10-CM | POA: Diagnosis not present

## 2023-12-20 DIAGNOSIS — Z79899 Other long term (current) drug therapy: Secondary | ICD-10-CM | POA: Diagnosis not present

## 2023-12-20 DIAGNOSIS — Z79632 Long term (current) use of antitumor antibiotic: Secondary | ICD-10-CM | POA: Diagnosis not present

## 2023-12-20 DIAGNOSIS — Z7952 Long term (current) use of systemic steroids: Secondary | ICD-10-CM | POA: Diagnosis not present

## 2023-12-20 DIAGNOSIS — Z23 Encounter for immunization: Secondary | ICD-10-CM | POA: Diagnosis not present

## 2023-12-20 DIAGNOSIS — Z79624 Long term (current) use of inhibitors of nucleotide synthesis: Secondary | ICD-10-CM | POA: Diagnosis not present

## 2023-12-20 DIAGNOSIS — Z79630 Long term (current) use of alkylating agent: Secondary | ICD-10-CM | POA: Diagnosis not present

## 2023-12-20 DIAGNOSIS — Z86718 Personal history of other venous thrombosis and embolism: Secondary | ICD-10-CM | POA: Diagnosis not present

## 2023-12-20 DIAGNOSIS — Z5111 Encounter for antineoplastic chemotherapy: Secondary | ICD-10-CM | POA: Diagnosis not present

## 2023-12-27 DIAGNOSIS — R000 Tachycardia, unspecified: Secondary | ICD-10-CM | POA: Diagnosis not present

## 2023-12-27 DIAGNOSIS — E785 Hyperlipidemia, unspecified: Secondary | ICD-10-CM | POA: Diagnosis not present

## 2023-12-27 DIAGNOSIS — Z91048 Other nonmedicinal substance allergy status: Secondary | ICD-10-CM | POA: Diagnosis not present

## 2023-12-27 DIAGNOSIS — C8290 Follicular lymphoma, unspecified, unspecified site: Secondary | ICD-10-CM | POA: Diagnosis not present

## 2023-12-27 DIAGNOSIS — D701 Agranulocytosis secondary to cancer chemotherapy: Secondary | ICD-10-CM | POA: Diagnosis not present

## 2023-12-27 DIAGNOSIS — E538 Deficiency of other specified B group vitamins: Secondary | ICD-10-CM | POA: Diagnosis not present

## 2023-12-27 DIAGNOSIS — M810 Age-related osteoporosis without current pathological fracture: Secondary | ICD-10-CM | POA: Diagnosis not present

## 2023-12-27 DIAGNOSIS — R5081 Fever presenting with conditions classified elsewhere: Secondary | ICD-10-CM | POA: Diagnosis not present

## 2023-12-27 DIAGNOSIS — I2510 Atherosclerotic heart disease of native coronary artery without angina pectoris: Secondary | ICD-10-CM | POA: Diagnosis not present

## 2023-12-27 DIAGNOSIS — R0982 Postnasal drip: Secondary | ICD-10-CM | POA: Diagnosis not present

## 2023-12-27 DIAGNOSIS — I10 Essential (primary) hypertension: Secondary | ICD-10-CM | POA: Diagnosis not present

## 2023-12-27 DIAGNOSIS — Z888 Allergy status to other drugs, medicaments and biological substances status: Secondary | ICD-10-CM | POA: Diagnosis not present

## 2023-12-27 DIAGNOSIS — Z923 Personal history of irradiation: Secondary | ICD-10-CM | POA: Diagnosis not present

## 2023-12-27 DIAGNOSIS — D84821 Immunodeficiency due to drugs: Secondary | ICD-10-CM | POA: Diagnosis not present

## 2023-12-27 DIAGNOSIS — Z79899 Other long term (current) drug therapy: Secondary | ICD-10-CM | POA: Diagnosis not present

## 2023-12-27 DIAGNOSIS — J328 Other chronic sinusitis: Secondary | ICD-10-CM | POA: Diagnosis not present

## 2023-12-27 DIAGNOSIS — Z86718 Personal history of other venous thrombosis and embolism: Secondary | ICD-10-CM | POA: Diagnosis not present

## 2023-12-27 DIAGNOSIS — R059 Cough, unspecified: Secondary | ICD-10-CM | POA: Diagnosis not present

## 2023-12-27 DIAGNOSIS — H019 Unspecified inflammation of eyelid: Secondary | ICD-10-CM | POA: Diagnosis not present

## 2023-12-27 DIAGNOSIS — T451X5A Adverse effect of antineoplastic and immunosuppressive drugs, initial encounter: Secondary | ICD-10-CM | POA: Diagnosis not present

## 2023-12-27 DIAGNOSIS — Z880 Allergy status to penicillin: Secondary | ICD-10-CM | POA: Diagnosis not present

## 2023-12-27 DIAGNOSIS — E876 Hypokalemia: Secondary | ICD-10-CM | POA: Diagnosis not present

## 2023-12-27 DIAGNOSIS — D709 Neutropenia, unspecified: Secondary | ICD-10-CM | POA: Diagnosis not present

## 2023-12-27 DIAGNOSIS — Z1152 Encounter for screening for COVID-19: Secondary | ICD-10-CM | POA: Diagnosis not present

## 2023-12-27 DIAGNOSIS — R509 Fever, unspecified: Secondary | ICD-10-CM | POA: Diagnosis not present

## 2023-12-27 DIAGNOSIS — L853 Xerosis cutis: Secondary | ICD-10-CM | POA: Diagnosis not present

## 2023-12-27 DIAGNOSIS — G629 Polyneuropathy, unspecified: Secondary | ICD-10-CM | POA: Diagnosis not present

## 2024-01-02 DIAGNOSIS — Z888 Allergy status to other drugs, medicaments and biological substances status: Secondary | ICD-10-CM | POA: Diagnosis not present

## 2024-01-02 DIAGNOSIS — E785 Hyperlipidemia, unspecified: Secondary | ICD-10-CM | POA: Diagnosis not present

## 2024-01-02 DIAGNOSIS — Z923 Personal history of irradiation: Secondary | ICD-10-CM | POA: Diagnosis not present

## 2024-01-02 DIAGNOSIS — J0130 Acute sphenoidal sinusitis, unspecified: Secondary | ICD-10-CM | POA: Diagnosis not present

## 2024-01-02 DIAGNOSIS — Z86718 Personal history of other venous thrombosis and embolism: Secondary | ICD-10-CM | POA: Diagnosis not present

## 2024-01-02 DIAGNOSIS — I2510 Atherosclerotic heart disease of native coronary artery without angina pectoris: Secondary | ICD-10-CM | POA: Diagnosis not present

## 2024-01-02 DIAGNOSIS — D72829 Elevated white blood cell count, unspecified: Secondary | ICD-10-CM | POA: Diagnosis not present

## 2024-01-02 DIAGNOSIS — C8290 Follicular lymphoma, unspecified, unspecified site: Secondary | ICD-10-CM | POA: Diagnosis not present

## 2024-01-02 DIAGNOSIS — G629 Polyneuropathy, unspecified: Secondary | ICD-10-CM | POA: Diagnosis not present

## 2024-01-02 DIAGNOSIS — Z7983 Long term (current) use of bisphosphonates: Secondary | ICD-10-CM | POA: Diagnosis not present

## 2024-01-02 DIAGNOSIS — R918 Other nonspecific abnormal finding of lung field: Secondary | ICD-10-CM | POA: Diagnosis not present

## 2024-01-02 DIAGNOSIS — Z880 Allergy status to penicillin: Secondary | ICD-10-CM | POA: Diagnosis not present

## 2024-01-02 DIAGNOSIS — R509 Fever, unspecified: Secondary | ICD-10-CM | POA: Diagnosis not present

## 2024-01-02 DIAGNOSIS — C8211 Follicular lymphoma grade II, lymph nodes of head, face, and neck: Secondary | ICD-10-CM | POA: Diagnosis not present

## 2024-01-02 DIAGNOSIS — R5081 Fever presenting with conditions classified elsewhere: Secondary | ICD-10-CM | POA: Diagnosis not present

## 2024-01-02 DIAGNOSIS — D649 Anemia, unspecified: Secondary | ICD-10-CM | POA: Diagnosis not present

## 2024-01-02 DIAGNOSIS — D709 Neutropenia, unspecified: Secondary | ICD-10-CM | POA: Diagnosis not present

## 2024-01-02 DIAGNOSIS — Z79899 Other long term (current) drug therapy: Secondary | ICD-10-CM | POA: Diagnosis not present

## 2024-01-02 DIAGNOSIS — M810 Age-related osteoporosis without current pathological fracture: Secondary | ICD-10-CM | POA: Diagnosis not present

## 2024-01-02 DIAGNOSIS — I10 Essential (primary) hypertension: Secondary | ICD-10-CM | POA: Diagnosis not present

## 2024-01-02 DIAGNOSIS — Z1152 Encounter for screening for COVID-19: Secondary | ICD-10-CM | POA: Diagnosis not present

## 2024-01-10 ENCOUNTER — Other Ambulatory Visit (HOSPITAL_BASED_OUTPATIENT_CLINIC_OR_DEPARTMENT_OTHER): Payer: Self-pay

## 2024-01-10 DIAGNOSIS — Z5111 Encounter for antineoplastic chemotherapy: Secondary | ICD-10-CM | POA: Diagnosis not present

## 2024-01-10 DIAGNOSIS — Z5112 Encounter for antineoplastic immunotherapy: Secondary | ICD-10-CM | POA: Diagnosis not present

## 2024-01-10 DIAGNOSIS — Z008 Encounter for other general examination: Secondary | ICD-10-CM | POA: Diagnosis not present

## 2024-01-10 DIAGNOSIS — Z713 Dietary counseling and surveillance: Secondary | ICD-10-CM | POA: Diagnosis not present

## 2024-01-10 DIAGNOSIS — Z79631 Long term (current) use of antimetabolite agent: Secondary | ICD-10-CM | POA: Diagnosis not present

## 2024-01-10 DIAGNOSIS — Z79630 Long term (current) use of alkylating agent: Secondary | ICD-10-CM | POA: Diagnosis not present

## 2024-01-10 DIAGNOSIS — C8218 Follicular lymphoma grade II, lymph nodes of multiple sites: Secondary | ICD-10-CM | POA: Diagnosis not present

## 2024-01-10 DIAGNOSIS — D630 Anemia in neoplastic disease: Secondary | ICD-10-CM | POA: Diagnosis not present

## 2024-01-28 DIAGNOSIS — C8218 Follicular lymphoma grade II, lymph nodes of multiple sites: Secondary | ICD-10-CM | POA: Diagnosis not present

## 2024-01-31 DIAGNOSIS — Z79630 Long term (current) use of alkylating agent: Secondary | ICD-10-CM | POA: Diagnosis not present

## 2024-01-31 DIAGNOSIS — Z79633 Long term (current) use of mitotic inhibitor: Secondary | ICD-10-CM | POA: Diagnosis not present

## 2024-01-31 DIAGNOSIS — Z79899 Other long term (current) drug therapy: Secondary | ICD-10-CM | POA: Diagnosis not present

## 2024-01-31 DIAGNOSIS — L853 Xerosis cutis: Secondary | ICD-10-CM | POA: Diagnosis not present

## 2024-01-31 DIAGNOSIS — T451X5A Adverse effect of antineoplastic and immunosuppressive drugs, initial encounter: Secondary | ICD-10-CM | POA: Diagnosis not present

## 2024-01-31 DIAGNOSIS — C8218 Follicular lymphoma grade II, lymph nodes of multiple sites: Secondary | ICD-10-CM | POA: Diagnosis not present

## 2024-01-31 DIAGNOSIS — D6481 Anemia due to antineoplastic chemotherapy: Secondary | ICD-10-CM | POA: Diagnosis not present

## 2024-01-31 DIAGNOSIS — Z5111 Encounter for antineoplastic chemotherapy: Secondary | ICD-10-CM | POA: Diagnosis not present

## 2024-01-31 DIAGNOSIS — Z5112 Encounter for antineoplastic immunotherapy: Secondary | ICD-10-CM | POA: Diagnosis not present

## 2024-02-13 DIAGNOSIS — C8218 Follicular lymphoma grade II, lymph nodes of multiple sites: Secondary | ICD-10-CM | POA: Diagnosis not present

## 2024-02-13 DIAGNOSIS — C8203 Follicular lymphoma grade I, intra-abdominal lymph nodes: Secondary | ICD-10-CM | POA: Diagnosis not present

## 2024-02-21 DIAGNOSIS — L853 Xerosis cutis: Secondary | ICD-10-CM | POA: Diagnosis not present

## 2024-02-21 DIAGNOSIS — Z86718 Personal history of other venous thrombosis and embolism: Secondary | ICD-10-CM | POA: Diagnosis not present

## 2024-02-21 DIAGNOSIS — Z5111 Encounter for antineoplastic chemotherapy: Secondary | ICD-10-CM | POA: Diagnosis not present

## 2024-02-21 DIAGNOSIS — D6481 Anemia due to antineoplastic chemotherapy: Secondary | ICD-10-CM | POA: Diagnosis not present

## 2024-02-21 DIAGNOSIS — Z79624 Long term (current) use of inhibitors of nucleotide synthesis: Secondary | ICD-10-CM | POA: Diagnosis not present

## 2024-02-21 DIAGNOSIS — Z5112 Encounter for antineoplastic immunotherapy: Secondary | ICD-10-CM | POA: Diagnosis not present

## 2024-02-21 DIAGNOSIS — C8218 Follicular lymphoma grade II, lymph nodes of multiple sites: Secondary | ICD-10-CM | POA: Diagnosis not present

## 2024-02-21 DIAGNOSIS — Z79899 Other long term (current) drug therapy: Secondary | ICD-10-CM | POA: Diagnosis not present

## 2024-02-21 DIAGNOSIS — T451X5A Adverse effect of antineoplastic and immunosuppressive drugs, initial encounter: Secondary | ICD-10-CM | POA: Diagnosis not present

## 2024-02-21 DIAGNOSIS — R5383 Other fatigue: Secondary | ICD-10-CM | POA: Diagnosis not present

## 2024-03-18 DIAGNOSIS — C8238 Follicular lymphoma grade IIIa, lymph nodes of multiple sites: Secondary | ICD-10-CM | POA: Diagnosis not present

## 2024-03-19 DIAGNOSIS — C8218 Follicular lymphoma grade II, lymph nodes of multiple sites: Secondary | ICD-10-CM | POA: Diagnosis not present

## 2024-03-19 DIAGNOSIS — C8203 Follicular lymphoma grade I, intra-abdominal lymph nodes: Secondary | ICD-10-CM | POA: Diagnosis not present

## 2024-03-26 DIAGNOSIS — C8203 Follicular lymphoma grade I, intra-abdominal lymph nodes: Secondary | ICD-10-CM | POA: Diagnosis not present

## 2024-03-26 DIAGNOSIS — C8290 Follicular lymphoma, unspecified, unspecified site: Secondary | ICD-10-CM | POA: Diagnosis not present

## 2024-03-30 DIAGNOSIS — M2041 Other hammer toe(s) (acquired), right foot: Secondary | ICD-10-CM | POA: Diagnosis not present

## 2024-03-30 DIAGNOSIS — L600 Ingrowing nail: Secondary | ICD-10-CM | POA: Diagnosis not present

## 2024-03-30 DIAGNOSIS — M79671 Pain in right foot: Secondary | ICD-10-CM | POA: Diagnosis not present

## 2024-03-30 DIAGNOSIS — B351 Tinea unguium: Secondary | ICD-10-CM | POA: Diagnosis not present

## 2024-03-30 DIAGNOSIS — M2042 Other hammer toe(s) (acquired), left foot: Secondary | ICD-10-CM | POA: Diagnosis not present

## 2024-03-30 DIAGNOSIS — M79672 Pain in left foot: Secondary | ICD-10-CM | POA: Diagnosis not present

## 2024-04-01 DIAGNOSIS — C8203 Follicular lymphoma grade I, intra-abdominal lymph nodes: Secondary | ICD-10-CM | POA: Diagnosis not present

## 2024-04-02 DIAGNOSIS — C8203 Follicular lymphoma grade I, intra-abdominal lymph nodes: Secondary | ICD-10-CM | POA: Diagnosis not present

## 2024-04-03 DIAGNOSIS — C8203 Follicular lymphoma grade I, intra-abdominal lymph nodes: Secondary | ICD-10-CM | POA: Diagnosis not present

## 2024-04-06 DIAGNOSIS — C8203 Follicular lymphoma grade I, intra-abdominal lymph nodes: Secondary | ICD-10-CM | POA: Diagnosis not present

## 2024-04-07 DIAGNOSIS — C8203 Follicular lymphoma grade I, intra-abdominal lymph nodes: Secondary | ICD-10-CM | POA: Diagnosis not present

## 2024-04-08 DIAGNOSIS — C8203 Follicular lymphoma grade I, intra-abdominal lymph nodes: Secondary | ICD-10-CM | POA: Diagnosis not present

## 2024-04-09 DIAGNOSIS — C8203 Follicular lymphoma grade I, intra-abdominal lymph nodes: Secondary | ICD-10-CM | POA: Diagnosis not present

## 2024-04-10 DIAGNOSIS — C8203 Follicular lymphoma grade I, intra-abdominal lymph nodes: Secondary | ICD-10-CM | POA: Diagnosis not present

## 2024-04-13 DIAGNOSIS — C8203 Follicular lymphoma grade I, intra-abdominal lymph nodes: Secondary | ICD-10-CM | POA: Diagnosis not present

## 2024-04-14 DIAGNOSIS — C8203 Follicular lymphoma grade I, intra-abdominal lymph nodes: Secondary | ICD-10-CM | POA: Diagnosis not present

## 2024-04-15 DIAGNOSIS — C8203 Follicular lymphoma grade I, intra-abdominal lymph nodes: Secondary | ICD-10-CM | POA: Diagnosis not present

## 2024-04-16 DIAGNOSIS — C8203 Follicular lymphoma grade I, intra-abdominal lymph nodes: Secondary | ICD-10-CM | POA: Diagnosis not present

## 2024-04-17 DIAGNOSIS — C8203 Follicular lymphoma grade I, intra-abdominal lymph nodes: Secondary | ICD-10-CM | POA: Diagnosis not present

## 2024-04-20 DIAGNOSIS — C8203 Follicular lymphoma grade I, intra-abdominal lymph nodes: Secondary | ICD-10-CM | POA: Diagnosis not present

## 2024-04-21 DIAGNOSIS — C8203 Follicular lymphoma grade I, intra-abdominal lymph nodes: Secondary | ICD-10-CM | POA: Diagnosis not present

## 2024-04-22 DIAGNOSIS — C8203 Follicular lymphoma grade I, intra-abdominal lymph nodes: Secondary | ICD-10-CM | POA: Diagnosis not present

## 2024-04-23 DIAGNOSIS — C8203 Follicular lymphoma grade I, intra-abdominal lymph nodes: Secondary | ICD-10-CM | POA: Diagnosis not present

## 2024-04-24 DIAGNOSIS — C8203 Follicular lymphoma grade I, intra-abdominal lymph nodes: Secondary | ICD-10-CM | POA: Diagnosis not present

## 2024-04-27 DIAGNOSIS — C8333 Diffuse large B-cell lymphoma, intra-abdominal lymph nodes: Secondary | ICD-10-CM | POA: Diagnosis not present

## 2024-04-27 DIAGNOSIS — C8203 Follicular lymphoma grade I, intra-abdominal lymph nodes: Secondary | ICD-10-CM | POA: Diagnosis not present

## 2024-04-28 DIAGNOSIS — C8203 Follicular lymphoma grade I, intra-abdominal lymph nodes: Secondary | ICD-10-CM | POA: Diagnosis not present

## 2024-04-29 DIAGNOSIS — C8203 Follicular lymphoma grade I, intra-abdominal lymph nodes: Secondary | ICD-10-CM | POA: Diagnosis not present

## 2024-04-30 DIAGNOSIS — C8203 Follicular lymphoma grade I, intra-abdominal lymph nodes: Secondary | ICD-10-CM | POA: Diagnosis not present

## 2024-05-01 DIAGNOSIS — C8203 Follicular lymphoma grade I, intra-abdominal lymph nodes: Secondary | ICD-10-CM | POA: Diagnosis not present

## 2024-05-04 ENCOUNTER — Ambulatory Visit

## 2024-05-13 DIAGNOSIS — C8218 Follicular lymphoma grade II, lymph nodes of multiple sites: Secondary | ICD-10-CM | POA: Diagnosis not present

## 2024-05-29 DIAGNOSIS — F32A Depression, unspecified: Secondary | ICD-10-CM | POA: Diagnosis not present

## 2024-05-29 DIAGNOSIS — C8298 Follicular lymphoma, unspecified, lymph nodes of multiple sites: Secondary | ICD-10-CM | POA: Diagnosis not present

## 2024-05-29 DIAGNOSIS — Z23 Encounter for immunization: Secondary | ICD-10-CM | POA: Diagnosis not present

## 2024-05-29 DIAGNOSIS — F419 Anxiety disorder, unspecified: Secondary | ICD-10-CM | POA: Diagnosis not present

## 2024-05-29 DIAGNOSIS — R634 Abnormal weight loss: Secondary | ICD-10-CM | POA: Diagnosis not present

## 2024-05-29 NOTE — Therapy (Signed)
 OUTPATIENT PHYSICAL THERAPY EVALUATION   Patient Name: Breanna Kirby MRN: [id number] DOB:July 08, 1956, 68 y.o., female Today's Date: 06/01/2024  END OF SESSION:  PT End of Session - 06/01/24 0933     Visit Number 1    Number of Visits 24    Date for PT Re-Evaluation 08/29/24    Authorization Type BCBS MCR    Progress Note Due on Visit 10    PT Start Time 0933    PT Stop Time 1015    PT Time Calculation (min) 42 min    Activity Tolerance Patient tolerated treatment well    Behavior During Therapy [facility] for tasks assessed/performed          Past Medical History:  Diagnosis Date   Lymphoma (HCC)    Past Surgical History:  Procedure Laterality Date   CESAREAN SECTION     Patient Active Problem List   Diagnosis Date Noted   Osteoporosis 11/19/2022   Acute deep vein thrombosis (DVT) of femoral vein (HCC) 12/19/2017   Fatigue 12/03/2017   Essential hypertension 03/14/2016   Follicular lymphoma grade II of lymph nodes of multiple sites (HCC) 04/06/2015   Vitiligo 01/22/2012    PCP: Cleotilde Planas, MD   REFERRING PROVIDER:   [name], [name]    REFERRING DIAG: muscular deconditioning   Rationale for Evaluation and Treatment: Rehabilitation  THERAPY DIAG:  No diagnosis found.  ONSET DATE: January 2025    SUBJECTIVE:                                                                                                                                                                                           SUBJECTIVE STATEMENT: Patient reports feeling a mass in the abdomen around January 2025. She was seen by oncology and began chemo very soon after for large B cell lymphoma. She received chemotherapy for about 6 months. Radiation began in June and just completed this treatment in August. She has a PET scan scheduled in November. She felt like her body did pretty well with chemo. For 2 weeks after radiation she experienced diarrhea that was resolved with medication. She has  lost a lot of weight since beginning treatment. She continues to walk for exercise daily. She tried to start very light weight-lifting, but wasn't really sure where to start. She reports her normal energy level is down a [place] bit. She is cautious with lifting activity. She will experience occasional back pain, but otherwise is doing ok. 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 She feels that she has lost a lot of muscle mass.  She reports intermittent numbness/tingling in the toes.   PERTINENT HISTORY:  Lymphoma- recent chemotherapy and radiation   PAIN:  Are you having pain? No  PRECAUTIONS:  Other: cancer  RED FLAGS: None   WEIGHT BEARING RESTRICTIONS:  No  FALLS:  Has patient fallen in last 6 months? No  LIVING ENVIRONMENT: Lives with: lives with their spouse Lives in: House/apartment Stairs: Yes: Internal: flight steps; on left going up Has following equipment at home: None  OCCUPATION:  Retired   PLOF:  Independent  PATIENT GOALS:  I would like to get strength back and get the muscle tone.   NEXT MD VISIT: November 2025    OBJECTIVE:  Note: Objective measures were completed at Evaluation unless otherwise noted.  DIAGNOSTIC FINDINGS:  PET scan scheduled in November   PATIENT SURVEYS:  Patient-specific activity scoring scheme (Point to one number):  0 represents "unable to perform." 10 represents "able to perform at prior level. 0 1 2 3 4 5 6 7 8 9  10 (Date and Score) Activity Initial  Activity Eval     Lifting > 5 lbs   2    Floor transfer   3    Average 2.5    Additional Additional Total score = sum of the activity scores/number of activities Minimum detectable change (90%CI) for average score = 2 points Minimum detectable change (90%CI) for single activity score = 3 points PSFS developed by: [name], [name], J. (1995). Assessing disability and change on individual   patients: a report of a patient specific measure. Physiotherapy [place], 47, [phone]. Reproduced with the permission of the authors     COGNITIVE STATUS: Within functional limits for tasks assessed   SENSATION: Reports numbness/tingling in toes     POSTURE:  forward head and increased thoracic kyphosis   GAIT: Distance walked: 20 ft  Assistive device utilized: None Level of assistance: Complete Independence Comments: no obvious gait abnormalities     UPPER EXTREMITY MMT:  MMT Right eval Left eval  Shoulder flexion 4- 4-  Shoulder extension    Shoulder abduction 5 5  Shoulder adduction    Shoulder extension    Shoulder internal rotation    Shoulder external rotation    Middle trapezius    Lower trapezius    Elbow flexion 4 4  Elbow extension 4 4  Wrist flexion    Wrist extension    Wrist ulnar deviation    Wrist radial deviation    Wrist pronation    Wrist supination    Grip strength     (Blank rows = not tested)   LOWER EXTREMITY MMT:    MMT Right eval Left eval  Hip flexion 4- 4-  Hip extension 4 4  Hip abduction 4 4  Hip adduction    Hip internal rotation    Hip external rotation    Knee flexion 4 4  Knee extension 4 4  Ankle dorsiflexion 4- 4-  Ankle plantarflexion    Ankle inversion    Ankle eversion     (Blank rows = not tested)    FUNCTIONAL TESTS:  5 x STS: 10.94 seconds  Squat: limited depth patient reports she feels weak.  [facility] Adult PT Treatment:                                                DATE: 06/01/24 Therapeutic Exercise: Demonstrated, performed, and issued initial HEP.    Self Care: Nutritional considerations with recommendations to seek referral for nutritionists.       PATIENT EDUCATION:  Education details: see education; POC Person educated: Patient Education method:  Explanation, Demonstration, Actor cues, Verbal cues, and Handouts Education comprehension: verbalized understanding, returned demonstration, verbal cues required, tactile cues required, and needs further education  HOME EXERCISE PROGRAM: Access Code: [email or web address]  URL: [email or web address] Date: 06/01/2024 Prepared by: Lucie Meeter  Exercises - Sit to Stand Without Arm Support  - 1 x daily - 7 x weekly - 2-3 sets - 8-10 reps - Wall Push Up  - 1 x daily - 7 x weekly - 2-3 sets - 8-10 reps - Heel Toe Raises with Counter Support  - 1 x daily - 7 x weekly - 2-3 sets - 8-10 reps - Standing Single Arm Elbow Flexion with Resistance  - 1 x daily - 7 x weekly - 2-3 sets - 8-10 reps   ASSESSMENT:  CLINICAL IMPRESSION: Patient is a 68 y.o. female who was seen today for physical therapy evaluation and treatment for muscular deconditioning following radiation and chemotherapy for lymphoma. She has kept up with walking for daily exercise, but reports ongoing weakness and fatigue as a result of lymphoma and resultant treatment. Upon assessment she is noted to have generalized weakness and has difficulty with functional tasks (lifting and transfers). She will benefit from skilled PT to address the above stated deficits in order to optimize her function.     OBJECTIVE IMPAIRMENTS: decreased activity tolerance, decreased balance, decreased endurance, decreased strength, impaired sensation, improper body mechanics, postural dysfunction, and pain.   ACTIVITY LIMITATIONS: carrying, lifting, bending, transfers, and locomotion level  PARTICIPATION LIMITATIONS: meal prep, cleaning, laundry, community activity, and yard work  PERSONAL FACTORS: Age, Fitness, Past/current experiences, Time since onset of injury/illness/exacerbation, and 3+ comorbidities: see PMH above are also affecting patient's functional outcome.   REHAB POTENTIAL: Good  CLINICAL DECISION MAKING: Evolving/moderate  complexity  EVALUATION COMPLEXITY: Moderate   GOALS: Goals reviewed with patient? Yes  SHORT TERM GOALS: Target date: 07/13/2024    Patient will be independent and compliant with initial HEP.   Baseline: initial HEP issued  Goal status: INITIAL  2.  Patient will be able to lift at least 5 lbs with proper form.  Baseline: see above  Goal status: INITIAL  3.  Patient will be able to complete lunge without UE support to work towards independent floor transfers.  Baseline: unable  Goal status: INITIAL   LONG TERM GOALS: Target date: 08/29/24  Patient will score at least a 5 on PSFS to signify clinically meaningful improvement in functional abilities.  Baseline: see above Goal status: INITIAL  2.  Patient will demonstrate gross 4+/5 BUE strength to improve ability to lift and carry items.  Baseline: see above Goal status: INITIAL  3.  Patient will demonstrate gross 4+/5 BLE strength to improve stability with transfers.  Baseline: see above Goal status: INITIAL  4.  Patient will be independent with advanced home program, including gym activity to progress/maintain current level of function.  Baseline: initial HEP issued  Goal status: INITIAL   PLAN:  PT  FREQUENCY: 1-2x/week  PT DURATION: 12 weeks  PLANNED INTERVENTIONS: 97164- PT Re-evaluation, 97750- Physical Performance Testing, 97110-Therapeutic exercises, 97530- Therapeutic activity, [id number]- Neuromuscular re-education, 97535- Self Care, [id number]- Manual therapy, and Cryotherapy.  PLAN FOR NEXT SESSION: review and update HEP; begin with gentle strengthening, allowing for rest breaks as needed. Functional strengthening as tolerated.    Jamira Barfuss, PT, DPT, ATC 06/01/24 1:14 PM

## 2024-06-01 ENCOUNTER — Ambulatory Visit

## 2024-06-01 ENCOUNTER — Other Ambulatory Visit: Payer: Self-pay

## 2024-06-01 DIAGNOSIS — M5459 Other low back pain: Secondary | ICD-10-CM | POA: Insufficient documentation

## 2024-06-01 DIAGNOSIS — M6281 Muscle weakness (generalized): Secondary | ICD-10-CM | POA: Insufficient documentation

## 2024-06-01 DIAGNOSIS — R29898 Other symptoms and signs involving the musculoskeletal system: Secondary | ICD-10-CM | POA: Diagnosis not present

## 2024-06-02 NOTE — Addendum Note (Signed)
 Addended by: Siddharth Babington C on: 06/02/2024 08:07 AM   Modules accepted: Orders

## 2024-06-04 ENCOUNTER — Ambulatory Visit: Payer: Self-pay

## 2024-06-04 DIAGNOSIS — R29898 Other symptoms and signs involving the musculoskeletal system: Secondary | ICD-10-CM

## 2024-06-04 DIAGNOSIS — M6281 Muscle weakness (generalized): Secondary | ICD-10-CM

## 2024-06-04 DIAGNOSIS — M5459 Other low back pain: Secondary | ICD-10-CM | POA: Diagnosis not present

## 2024-06-04 NOTE — Therapy (Signed)
 OUTPATIENT PHYSICAL THERAPY TREATMENT   Patient Name: Breanna Kirby MRN: [id number] DOB:1956-09-21, 68 y.o., female Today's Date: 06/04/2024  END OF SESSION:  PT End of Session - 06/04/24 1017     Visit Number 2    Number of Visits 24    Date for PT Re-Evaluation 08/29/24    Authorization Type BCBS MCR    Progress Note Due on Visit 10    PT Start Time 1015    PT Stop Time 1056    PT Time Calculation (min) 41 min    Activity Tolerance Patient tolerated treatment well    Behavior During Therapy [facility] for tasks assessed/performed           Past Medical History:  Diagnosis Date   Lymphoma (HCC)    Past Surgical History:  Procedure Laterality Date   CESAREAN SECTION     Patient Active Problem List   Diagnosis Date Noted   Osteoporosis 11/19/2022   Acute deep vein thrombosis (DVT) of femoral vein (HCC) 12/19/2017   Fatigue 12/03/2017   Essential hypertension 03/14/2016   Follicular lymphoma grade II of lymph nodes of multiple sites (HCC) 04/06/2015   Vitiligo 01/22/2012    PCP: Cleotilde Planas, MD   REFERRING PROVIDER:   [name], [name]    REFERRING DIAG: muscular deconditioning   Rationale for Evaluation and Treatment: Rehabilitation  THERAPY DIAG:  Muscle weakness (generalized)  Other symptoms and signs involving the musculoskeletal system  ONSET DATE: January 2025    SUBJECTIVE:                                                                                                                                                                                           SUBJECTIVE STATEMENT: Patient reports she is feeling pretty good today. She planted some plants yesterday without issues and went on a walk this morning.   EVAL: Patient reports feeling a mass in the abdomen around January 2025. She was seen by oncology and began chemo very soon after for large B cell lymphoma. She received chemotherapy for about 6 months. Radiation began in June and just  completed this treatment in August. She has a PET scan scheduled in November. She felt like her body did pretty well with chemo. For 2 weeks after radiation she experienced diarrhea that was resolved with medication. She has lost a lot of weight since beginning treatment. She continues to walk for exercise daily. She tried to start very light weight-lifting, but wasn't really sure where to start. She reports her normal energy level is down a [place] bit. She is cautious with lifting activity. She will experience occasional  back pain, but otherwise is doing ok. 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 She feels that she has lost a lot of muscle mass. She reports intermittent numbness/tingling in the toes.   PERTINENT HISTORY:  Lymphoma- recent chemotherapy and radiation   PAIN:  Are you having pain? No  PRECAUTIONS:  Other: cancer  RED FLAGS: None   WEIGHT BEARING RESTRICTIONS:  No  FALLS:  Has patient fallen in last 6 months? No  LIVING ENVIRONMENT: Lives with: lives with their spouse Lives in: House/apartment Stairs: Yes: Internal: flight steps; on left going up Has following equipment at home: None  OCCUPATION:  Retired   PLOF:  Independent  PATIENT GOALS:  I would like to get strength back and get the muscle tone.   NEXT MD VISIT: November 2025    OBJECTIVE:  Note: Objective measures were completed at Evaluation unless otherwise noted.  DIAGNOSTIC FINDINGS:  PET scan scheduled in November   PATIENT SURVEYS:  Patient-specific activity scoring scheme (Point to one number):  0 represents "unable to perform." 10 represents "able to perform at prior level. 0 1 2 3 4 5 6 7 8 9  10 (Date and Score) Activity Initial  Activity Eval     Lifting > 5 lbs   2    Floor transfer   3    Average 2.5    Additional Additional Total score = sum of the activity scores/number of activities Minimum detectable change (90%CI)  for average score = 2 points Minimum detectable change (90%CI) for single activity score = 3 points PSFS developed by: [name], [name], J. (1995). Assessing disability and change on individual  patients: a report of a patient specific measure. Physiotherapy [place], 47, [phone]. Reproduced with the permission of the authors     COGNITIVE STATUS: Within functional limits for tasks assessed   SENSATION: Reports numbness/tingling in toes     POSTURE:  forward head and increased thoracic kyphosis   GAIT: Distance walked: 20 ft  Assistive device utilized: None Level of assistance: Complete Independence Comments: no obvious gait abnormalities     UPPER EXTREMITY MMT:  MMT Right eval Left eval  Shoulder flexion 4- 4-  Shoulder extension    Shoulder abduction 5 5  Shoulder adduction    Shoulder extension    Shoulder internal rotation    Shoulder external rotation    Middle trapezius    Lower trapezius    Elbow flexion 4 4  Elbow extension 4 4  Wrist flexion    Wrist extension    Wrist ulnar deviation    Wrist radial deviation    Wrist pronation    Wrist supination    Grip strength     (Blank rows = not tested)   LOWER EXTREMITY MMT:    MMT Right eval Left eval  Hip flexion 4- 4-  Hip extension 4 4  Hip abduction 4 4  Hip adduction    Hip internal rotation    Hip external rotation    Knee flexion 4 4  Knee extension 4 4  Ankle dorsiflexion 4- 4-  Ankle plantarflexion    Ankle inversion    Ankle eversion     (Blank rows = not tested)    FUNCTIONAL TESTS:  5 x STS: 10.94 seconds  Squat: limited depth patient reports she feels weak.          [facility] Adult PT Treatment:  DATE: 06/04/24 Therapeutic Exercise: Seated hip abduction 3 x 10; red band  Seated hip flexion 3 x 10; red band  Reviewed and updated HEP   Therapeutic Activity: Seated shoulder flexion 1# 3 x 10   Standing shoulder abduction 1# 3 x 10   Self Care: Nutritional considerations with recommendations on tracking calories, protein; use of fitness app for calculating/tracking or f/u with nutritionist; recommendations on where to purchase protein shakes.                                                                                                                             [facility] Adult PT Treatment:                                                DATE: 06/01/24 Therapeutic Exercise: Demonstrated, performed, and issued initial HEP.    Self Care: Nutritional considerations with recommendations to seek referral for nutritionists.       PATIENT EDUCATION:  Education details: see treatment Person educated: Patient Education method: Explanation, Demonstration, Tactile cues, Verbal cues, and Handouts Education comprehension: verbalized understanding, returned demonstration, verbal cues required, tactile cues required, and needs further education  HOME EXERCISE PROGRAM: Access Code: [email or web address]  URL: [email or web address] Date: 06/04/2024 Prepared by: Lucie Meeter  Program Notes split lower body and upper body strengthening seperately during the day.   Exercises - Sit to Stand Without Arm Support  - 1 x daily - 7 x weekly - 2-3 sets - 8-10 reps - Seated Hip Abduction with Resistance  - 1 x daily - 7 x weekly - 2-3 sets - 10 reps - Seated March with Resistance  - 1 x daily - 7 x weekly - 2-3 sets - 10 reps - Heel Toe Raises with Counter Support  - 1 x daily - 7 x weekly - 2-3 sets - 8-10 reps - Wall Push Up  - 1 x daily - 7 x weekly - 2-3 sets - 8-10 reps - Standing Single Arm Elbow Flexion with Resistance  - 1 x daily - 7 x weekly - 2-3 sets - 8-10 reps - Seated Shoulder Flexion with Dumbbells  - 1 x daily - 7 x weekly - 2-3 sets - 10 reps - Seated Shoulder Abduction with Dumbbells - Thumbs Up  - 1 x daily - 7 x weekly - 2-3 sets - 10 reps   ASSESSMENT:  CLINICAL  IMPRESSION: Focused on generalized lower extremity and upper extremity strengthening with good tolerance. Allowed for rest breaks following each set of strengthening given overall deconditioning. We also discussed calculating her caloric intake/out-take utilizing a fitness app or with nutritionist with patient verbalizing understanding. No reports of fatigue throughout session.   EVAL: Patient is a 68 y.o. female who was seen today for physical therapy evaluation and treatment for muscular deconditioning following radiation and  chemotherapy for lymphoma. She has kept up with walking for daily exercise, but reports ongoing weakness and fatigue as a result of lymphoma and resultant treatment. Upon assessment she is noted to have generalized weakness and has difficulty with functional tasks (lifting and transfers). She will benefit from skilled PT to address the above stated deficits in order to optimize her function.     OBJECTIVE IMPAIRMENTS: decreased activity tolerance, decreased balance, decreased endurance, decreased strength, impaired sensation, improper body mechanics, postural dysfunction, and pain.   ACTIVITY LIMITATIONS: carrying, lifting, bending, transfers, and locomotion level  PARTICIPATION LIMITATIONS: meal prep, cleaning, laundry, community activity, and yard work  PERSONAL FACTORS: Age, Fitness, Past/current experiences, Time since onset of injury/illness/exacerbation, and 3+ comorbidities: see PMH above are also affecting patient's functional outcome.   REHAB POTENTIAL: Good  CLINICAL DECISION MAKING: Evolving/moderate complexity  EVALUATION COMPLEXITY: Moderate   GOALS: Goals reviewed with patient? Yes  SHORT TERM GOALS: Target date: 07/13/2024    Patient will be independent and compliant with initial HEP.   Baseline: initial HEP issued  Goal status: INITIAL  2.  Patient will be able to lift at least 5 lbs with proper form.  Baseline: see above  Goal status:  INITIAL  3.  Patient will be able to complete lunge without UE support to work towards independent floor transfers.  Baseline: unable  Goal status: INITIAL   LONG TERM GOALS: Target date: 08/29/24  Patient will score at least a 5 on PSFS to signify clinically meaningful improvement in functional abilities.  Baseline: see above Goal status: INITIAL  2.  Patient will demonstrate gross 4+/5 BUE strength to improve ability to lift and carry items.  Baseline: see above Goal status: INITIAL  3.  Patient will demonstrate gross 4+/5 BLE strength to improve stability with transfers.  Baseline: see above Goal status: INITIAL  4.  Patient will be independent with advanced home program, including gym activity to progress/maintain current level of function.  Baseline: initial HEP issued  Goal status: INITIAL   PLAN:  PT FREQUENCY: 1-2x/week  PT DURATION: 12 weeks  PLANNED INTERVENTIONS: 97164- PT Re-evaluation, 97750- Physical Performance Testing, 97110-Therapeutic exercises, 97530- Therapeutic activity, [id number]- Neuromuscular re-education, 97535- Self Care, 02859- Manual therapy, and Cryotherapy.  PLAN FOR NEXT SESSION: review and update HEP; begin with gentle strengthening, allowing for rest breaks as needed. Functional strengthening as tolerated.    Ida Uppal, PT, DPT, ATC 06/04/24 10:58 AM

## 2024-06-08 ENCOUNTER — Ambulatory Visit

## 2024-06-08 DIAGNOSIS — M6281 Muscle weakness (generalized): Secondary | ICD-10-CM | POA: Diagnosis not present

## 2024-06-08 DIAGNOSIS — M5459 Other low back pain: Secondary | ICD-10-CM | POA: Diagnosis not present

## 2024-06-08 DIAGNOSIS — R29898 Other symptoms and signs involving the musculoskeletal system: Secondary | ICD-10-CM | POA: Diagnosis not present

## 2024-06-08 NOTE — Therapy (Signed)
 OUTPATIENT PHYSICAL THERAPY TREATMENT   Patient Name: Breanna Kirby MRN: [id number] DOB:[birth year], 68 y.o., female Today's Date: 06/08/2024  END OF SESSION:  PT End of Session - 06/08/24 1148     Visit Number 3    Number of Visits 24    Date for PT Re-Evaluation 08/29/24    Authorization Type BCBS MCR    Progress Note Due on Visit 10    PT Start Time 1148    PT Stop Time 1228    PT Time Calculation (min) 40 min    Activity Tolerance Patient tolerated treatment well    Behavior During Therapy [facility] for tasks assessed/performed           Past Medical History:  Diagnosis Date   Lymphoma (HCC)    Past Surgical History:  Procedure Laterality Date   CESAREAN SECTION     Patient Active Problem List   Diagnosis Date Noted   Osteoporosis 11/19/2022   Acute deep vein thrombosis (DVT) of femoral vein (HCC) 12/19/2017   Fatigue 12/03/2017   Essential hypertension 03/14/2016   Follicular lymphoma grade II of lymph nodes of multiple sites (HCC) 04/06/2015   Vitiligo 01/22/2012    PCP: Cleotilde Planas, MD   REFERRING PROVIDER:   [name], [name]    REFERRING DIAG: muscular deconditioning   Rationale for Evaluation and Treatment: Rehabilitation  THERAPY DIAG:  Muscle weakness (generalized)  Other symptoms and signs involving the musculoskeletal system  ONSET DATE: January 2025    SUBJECTIVE:                                                                                                                                                                                           SUBJECTIVE STATEMENT: Patient reports she is feeling good. Does not recall any soreness after last session. Is using her 2 lb weights at home and will feel that working her shoulders.   EVAL: Patient reports feeling a mass in the abdomen around January 2025. She was seen by oncology and began chemo very soon after for large B cell lymphoma. She received chemotherapy for about 6 months. Radiation  began in June and just completed this treatment in August. She has a PET scan scheduled in November. She felt like her body did pretty well with chemo. For 2 weeks after radiation she experienced diarrhea that was resolved with medication. She has lost a lot of weight since beginning treatment. She continues to walk for exercise daily. She tried to start very light weight-lifting, but wasn't really sure where to start. She reports her normal energy level is down a [place] bit. She is cautious  with lifting activity. She will experience occasional back pain, but otherwise is doing ok. 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 She feels that she has lost a lot of muscle mass. She reports intermittent numbness/tingling in the toes.   PERTINENT HISTORY:  Lymphoma- recent chemotherapy and radiation   PAIN:  Are you having pain? No  PRECAUTIONS:  Other: cancer  RED FLAGS: None   WEIGHT BEARING RESTRICTIONS:  No  FALLS:  Has patient fallen in last 6 months? No  LIVING ENVIRONMENT: Lives with: lives with their spouse Lives in: House/apartment Stairs: Yes: Internal: flight steps; on left going up Has following equipment at home: None  OCCUPATION:  Retired   PLOF:  Independent  PATIENT GOALS:  I would like to get strength back and get the muscle tone.   NEXT MD VISIT: November 2025    OBJECTIVE:  Note: Objective measures were completed at Evaluation unless otherwise noted.  DIAGNOSTIC FINDINGS:  PET scan scheduled in November   PATIENT SURVEYS:  Patient-specific activity scoring scheme (Point to one number):  0 represents "unable to perform." 10 represents "able to perform at prior level. 0 1 2 3 4 5 6 7 8 9  10 (Date and Score) Activity Initial  Activity Eval     Lifting > 5 lbs   2    Floor transfer   3    Average 2.5    Additional Additional Total score = sum of the activity scores/number of activities Minimum  detectable change (90%CI) for average score = 2 points Minimum detectable change (90%CI) for single activity score = 3 points PSFS developed by: [name], [name], J. (1995). Assessing disability and change on individual  patients: a report of a patient specific measure. Physiotherapy [place], 47, [phone]. Reproduced with the permission of the authors     COGNITIVE STATUS: Within functional limits for tasks assessed   SENSATION: Reports numbness/tingling in toes     POSTURE:  forward head and increased thoracic kyphosis   GAIT: Distance walked: 20 ft  Assistive device utilized: None Level of assistance: Complete Independence Comments: no obvious gait abnormalities     UPPER EXTREMITY MMT:  MMT Right eval Left eval  Shoulder flexion 4- 4-  Shoulder extension    Shoulder abduction 5 5  Shoulder adduction    Shoulder extension    Shoulder internal rotation    Shoulder external rotation    Middle trapezius    Lower trapezius    Elbow flexion 4 4  Elbow extension 4 4  Wrist flexion    Wrist extension    Wrist ulnar deviation    Wrist radial deviation    Wrist pronation    Wrist supination    Grip strength     (Blank rows = not tested)   LOWER EXTREMITY MMT:    MMT Right eval Left eval  Hip flexion 4- 4-  Hip extension 4 4  Hip abduction 4 4  Hip adduction    Hip internal rotation    Hip external rotation    Knee flexion 4 4  Knee extension 4 4  Ankle dorsiflexion 4- 4-  Ankle plantarflexion    Ankle inversion    Ankle eversion     (Blank rows = not tested)    FUNCTIONAL TESTS:  5 x STS: 10.94 seconds  Squat: limited depth patient reports she feels weak.  [facility] Adult PT Treatment:                                                DATE: 06/08/24 Therapeutic Exercise: Standing hip abduction 3 x 10 @ red band  Standing hip extension 3 x 10 @ red band  LAQ 3 x 10 @ 2 lbs   Therapeutic Activity: Standing  march 3 x 10  Sit to stand 1 x 10 @ 5 lbs  Curl to overhead press 3 x 10 @ 1 lb   Self Care: Gym recommendations Potential benefit of purchasing ankle weights for strength training  Proper Lifting education-demo and returned demo   [facility] Adult PT Treatment:                                                DATE: 06/04/24 Therapeutic Exercise: Seated hip abduction 3 x 10; red band  Seated hip flexion 3 x 10; red band  Reviewed and updated HEP   Therapeutic Activity: Seated shoulder flexion 1# 3 x 10  Standing shoulder abduction 1# 3 x 10   Self Care: Nutritional considerations with recommendations on tracking calories, protein; use of fitness app for calculating/tracking or f/u with nutritionist; recommendations on where to purchase protein shakes.                                                                                                                             [facility] Adult PT Treatment:                                                DATE: 06/01/24 Therapeutic Exercise: Demonstrated, performed, and issued initial HEP.    Self Care: Nutritional considerations with recommendations to seek referral for nutritionists.       PATIENT EDUCATION:  Education details: see treatment Person educated: Patient Education method: Explanation, Demonstration, Tactile cues, Verbal cues, and Handouts Education comprehension: verbalized understanding, returned demonstration, verbal cues required, tactile cues required, and needs further education  HOME EXERCISE PROGRAM: Access Code: [email or web address]  URL: [email or web address] Date: 06/08/2024 Prepared by: Lucie Meeter  Program Notes split lower body and upper body strengthening seperately during the day.   Exercises - Sit to Stand Without Arm Support  - 1 x daily - 7 x weekly - 2-3 sets - 8-10 reps - Standing Hip Abduction with Resistance at Ankles and Counter Support  - 1 x daily - 7 x weekly - 2-3 sets - 10 reps - Standing Hip  Extension with Resistance at Ankles and Counter Support  - 1 x daily -  7 x weekly - 2-3 sets - 10 reps - Standing Marching  - 1 x daily - 7 x weekly - 2-3 sets - 10 reps - Heel Toe Raises with Counter Support  - 1 x daily - 7 x weekly - 2-3 sets - 8-10 reps - Wall Push Up  - 1 x daily - 7 x weekly - 2-3 sets - 8-10 reps - Standing Single Arm Elbow Flexion with Resistance  - 1 x daily - 7 x weekly - 2-3 sets - 8-10 reps - Seated Shoulder Flexion with Dumbbells  - 1 x daily - 7 x weekly - 2-3 sets - 10 reps - Seated Shoulder Abduction with Dumbbells - Thumbs Up  - 1 x daily - 7 x weekly - 2-3 sets - 10 reps   ASSESSMENT:  CLINICAL IMPRESSION: Introduced standing LE strengthening with good tolerance. Progressed resistance with knee strengthening with recommendation to potentially purchase ankle weights for further strengthening as part of HEP. Allowed for standing/seated rest break between sets and exercises today to allow for recovery. Mild discomfort noted in Rt knee following 10 reps of weighted sit to stand, so discontinued after first set. No reports of pain or fatigue at conclusion of session.   EVAL: Patient is a 68 y.o. female who was seen today for physical therapy evaluation and treatment for muscular deconditioning following radiation and chemotherapy for lymphoma. She has kept up with walking for daily exercise, but reports ongoing weakness and fatigue as a result of lymphoma and resultant treatment. Upon assessment she is noted to have generalized weakness and has difficulty with functional tasks (lifting and transfers). She will benefit from skilled PT to address the above stated deficits in order to optimize her function.     OBJECTIVE IMPAIRMENTS: decreased activity tolerance, decreased balance, decreased endurance, decreased strength, impaired sensation, improper body mechanics, postural dysfunction, and pain.   ACTIVITY LIMITATIONS: carrying, lifting, bending, transfers, and  locomotion level  PARTICIPATION LIMITATIONS: meal prep, cleaning, laundry, community activity, and yard work  PERSONAL FACTORS: Age, Fitness, Past/current experiences, Time since onset of injury/illness/exacerbation, and 3+ comorbidities: see PMH above are also affecting patient's functional outcome.   REHAB POTENTIAL: Good  CLINICAL DECISION MAKING: Evolving/moderate complexity  EVALUATION COMPLEXITY: Moderate   GOALS: Goals reviewed with patient? Yes  SHORT TERM GOALS: Target date: 07/13/2024    Patient will be independent and compliant with initial HEP.   Baseline: initial HEP issued  Goal status: INITIAL  2.  Patient will be able to lift at least 5 lbs with proper form.  Baseline: see above  Goal status: INITIAL  3.  Patient will be able to complete lunge without UE support to work towards independent floor transfers.  Baseline: unable  Goal status: INITIAL   LONG TERM GOALS: Target date: 08/29/24  Patient will score at least a 5 on PSFS to signify clinically meaningful improvement in functional abilities.  Baseline: see above Goal status: INITIAL  2.  Patient will demonstrate gross 4+/5 BUE strength to improve ability to lift and carry items.  Baseline: see above Goal status: INITIAL  3.  Patient will demonstrate gross 4+/5 BLE strength to improve stability with transfers.  Baseline: see above Goal status: INITIAL  4.  Patient will be independent with advanced home program, including gym activity to progress/maintain current level of function.  Baseline: initial HEP issued  Goal status: INITIAL   PLAN:  PT FREQUENCY: 1-2x/week  PT DURATION: 12 weeks  PLANNED INTERVENTIONS: 97164- PT Re-evaluation, 97750-  Physical Performance Testing, 97110-Therapeutic exercises, 97530- Therapeutic activity, [id number]- Neuromuscular re-education, 97535- Self Care, [id number]- Manual therapy, and Cryotherapy.  PLAN FOR NEXT SESSION: review and update HEP; begin with gentle  strengthening, allowing for rest breaks as needed. Functional strengthening as tolerated.    Jazma Pickel, PT, DPT, ATC 06/08/24 12:33 PM

## 2024-06-10 DIAGNOSIS — K080 Exfoliation of teeth due to systemic causes: Secondary | ICD-10-CM | POA: Diagnosis not present

## 2024-06-11 ENCOUNTER — Ambulatory Visit

## 2024-06-11 ENCOUNTER — Other Ambulatory Visit: Payer: Self-pay | Admitting: Family Medicine

## 2024-06-11 DIAGNOSIS — M5459 Other low back pain: Secondary | ICD-10-CM | POA: Diagnosis not present

## 2024-06-11 DIAGNOSIS — R29898 Other symptoms and signs involving the musculoskeletal system: Secondary | ICD-10-CM

## 2024-06-11 DIAGNOSIS — M6281 Muscle weakness (generalized): Secondary | ICD-10-CM | POA: Diagnosis not present

## 2024-06-11 DIAGNOSIS — Z0000 Encounter for general adult medical examination without abnormal findings: Secondary | ICD-10-CM

## 2024-06-11 NOTE — Therapy (Signed)
 OUTPATIENT PHYSICAL THERAPY TREATMENT   Patient Name: Breanna Kirby MRN: [id number] DOB:03/20/1956, 68 y.o., female Today's Date: 06/11/2024  END OF SESSION:  PT End of Session - 06/11/24 1101     Visit Number 4    Number of Visits 24    Date for Recertification  08/29/24    Authorization Type BCBS MCR    Progress Note Due on Visit 10    PT Start Time 1101    PT Stop Time 1141    PT Time Calculation (min) 40 min    Activity Tolerance Patient tolerated treatment well    Behavior During Therapy [facility] for tasks assessed/performed            Past Medical History:  Diagnosis Date   Lymphoma (HCC)    Past Surgical History:  Procedure Laterality Date   CESAREAN SECTION     Patient Active Problem List   Diagnosis Date Noted   Osteoporosis 11/19/2022   Acute deep vein thrombosis (DVT) of femoral vein (HCC) 12/19/2017   Fatigue 12/03/2017   Essential hypertension 03/14/2016   Follicular lymphoma grade II of lymph nodes of multiple sites (HCC) 04/06/2015   Vitiligo 01/22/2012    PCP: Cleotilde Planas, MD   REFERRING PROVIDER:   [name], [name]    REFERRING DIAG: muscular deconditioning   Rationale for Evaluation and Treatment: Rehabilitation  THERAPY DIAG:  Muscle weakness (generalized)  Other symptoms and signs involving the musculoskeletal system  ONSET DATE: January 2025    SUBJECTIVE:                                                                                                                                                                                           SUBJECTIVE STATEMENT: Patient reports she had some soreness in the low back last night and it's still there today, but not as bad. She looked into the cancer gym class through [facility] and is potentially interested in this.   EVAL: Patient reports feeling a mass in the abdomen around January 2025. She was seen by oncology and began chemo very soon after for large B cell lymphoma. She received  chemotherapy for about 6 months. Radiation began in June and just completed this treatment in August. She has a PET scan scheduled in November. She felt like her body did pretty well with chemo. For 2 weeks after radiation she experienced diarrhea that was resolved with medication. She has lost a lot of weight since beginning treatment. She continues to walk for exercise daily. She tried to start very light weight-lifting, but wasn't really sure where to start. She reports her normal energy level  is down a [place] bit. She is cautious with lifting activity. She will experience occasional back pain, but otherwise is doing ok. 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 She feels that she has lost a lot of muscle mass. She reports intermittent numbness/tingling in the toes.   PERTINENT HISTORY:  Lymphoma- recent chemotherapy and radiation   PAIN:  Are you having pain? Yes: NPRS scale: 2 Pain location: low back Pain description: sore Aggravating factors: unknown Relieving factors: tylenol    PRECAUTIONS:  Other: cancer  RED FLAGS: None   WEIGHT BEARING RESTRICTIONS:  No  FALLS:  Has patient fallen in last 6 months? No  LIVING ENVIRONMENT: Lives with: lives with their spouse Lives in: House/apartment Stairs: Yes: Internal: flight steps; on left going up Has following equipment at home: None  OCCUPATION:  Retired   PLOF:  Independent  PATIENT GOALS:  I would like to get strength back and get the muscle tone.   NEXT MD VISIT: November 2025    OBJECTIVE:  Note: Objective measures were completed at Evaluation unless otherwise noted.  DIAGNOSTIC FINDINGS:  PET scan scheduled in November   PATIENT SURVEYS:  Patient-specific activity scoring scheme (Point to one number):  0 represents "unable to perform." 10 represents "able to perform at prior level. 0 1 2 3 4 5 6 7 8 9  10 (Date and Score) Activity Initial  Activity Eval      Lifting > 5 lbs   2    Floor transfer   3    Average 2.5    Additional Additional Total score = sum of the activity scores/number of activities Minimum detectable change (90%CI) for average score = 2 points Minimum detectable change (90%CI) for single activity score = 3 points PSFS developed by: [name], [name], J. (1995). Assessing disability and change on individual  patients: a report of a patient specific measure. Physiotherapy [place], 47, [phone]. Reproduced with the permission of the authors     COGNITIVE STATUS: Within functional limits for tasks assessed   SENSATION: Reports numbness/tingling in toes     POSTURE:  forward head and increased thoracic kyphosis   GAIT: Distance walked: 20 ft  Assistive device utilized: None Level of assistance: Complete Independence Comments: no obvious gait abnormalities     UPPER EXTREMITY MMT:  MMT Right eval Left eval  Shoulder flexion 4- 4-  Shoulder extension    Shoulder abduction 5 5  Shoulder adduction    Shoulder extension    Shoulder internal rotation    Shoulder external rotation    Middle trapezius    Lower trapezius    Elbow flexion 4 4  Elbow extension 4 4  Wrist flexion    Wrist extension    Wrist ulnar deviation    Wrist radial deviation    Wrist pronation    Wrist supination    Grip strength     (Blank rows = not tested)   LOWER EXTREMITY MMT:    MMT Right eval Left eval  Hip flexion 4- 4-  Hip extension 4 4  Hip abduction 4 4  Hip adduction    Hip internal rotation    Hip external rotation    Knee flexion 4 4  Knee extension 4 4  Ankle dorsiflexion 4- 4-  Ankle plantarflexion    Ankle inversion    Ankle eversion     (Blank rows = not tested)  FUNCTIONAL TESTS:  5 x STS: 10.94 seconds  Squat: limited depth patient reports she feels weak.       [facility] Adult PT Treatment:                                                DATE:  06/11/24 Therapeutic Exercise: NuStep level 5 x UE/LE  Tricep extension 3 x 10 @ 2 lbs   Therapeutic Activity: Seated hip hinge with dowel x 10  Standing hip hinge with dowel x 10 Standing hip hinge holding dowel in front x 10  Leg press 3 x 10 @ 40 lbs  Curl to overhead press 3 x 10 @ 2 lbs     [facility] Adult PT Treatment:                                                DATE: 06/08/24 Therapeutic Exercise: Standing hip abduction 3 x 10 @ red band  Standing hip extension 3 x 10 @ red band  LAQ 3 x 10 @ 2 lbs   Therapeutic Activity: Standing march 3 x 10  Sit to stand 1 x 10 @ 5 lbs  Curl to overhead press 3 x 10 @ 1 lb   Self Care: Gym recommendations Potential benefit of purchasing ankle weights for strength training  Proper Lifting education-demo and returned demo   [facility] Adult PT Treatment:                                                DATE: 06/04/24 Therapeutic Exercise: Seated hip abduction 3 x 10; red band  Seated hip flexion 3 x 10; red band  Reviewed and updated HEP   Therapeutic Activity: Seated shoulder flexion 1# 3 x 10  Standing shoulder abduction 1# 3 x 10   Self Care: Nutritional considerations with recommendations on tracking calories, protein; use of fitness app for calculating/tracking or f/u with nutritionist; recommendations on where to purchase protein shakes.       PATIENT EDUCATION:  Education details: HEP review  Person educated: Patient Education method: Explanation Education comprehension: verbalized understanding  HOME EXERCISE PROGRAM: Access Code: [email or web address]  URL: [email or web address] Date: 06/08/2024 Prepared by: Lucie Meeter  Program Notes split lower body and upper body strengthening seperately during the day.   Exercises - Sit to Stand Without Arm Support  - 1 x daily - 7 x weekly - 2-3 sets - 8-10 reps - Standing Hip Abduction with Resistance at Ankles and Counter Support  - 1 x daily - 7 x weekly - 2-3 sets - 10  reps - Standing Hip Extension with Resistance at Ankles and Counter Support  - 1 x daily - 7 x weekly - 2-3 sets - 10 reps - Standing Marching  - 1 x daily - 7 x weekly - 2-3 sets - 10 reps - Heel Toe Raises with Counter Support  - 1 x daily - 7 x weekly - 2-3 sets - 8-10 reps - Wall Push Up  - 1 x daily - 7 x weekly - 2-3 sets - 8-10 reps - Standing Single Arm  Elbow Flexion with Resistance  - 1 x daily - 7 x weekly - 2-3 sets - 8-10 reps - Seated Shoulder Flexion with Dumbbells  - 1 x daily - 7 x weekly - 2-3 sets - 10 reps - Seated Shoulder Abduction with Dumbbells - Thumbs Up  - 1 x daily - 7 x weekly - 2-3 sets - 10 reps   ASSESSMENT:  CLINICAL IMPRESSION: Patient arrives with mild low back soreness, but does not recall any exacerbating activity for this soreness. We worked on Architectural technologist with patient demonstrating proper form with hip hinge utilizing dowel in sitting and standing. Introduced leg press today with light weight with patient demonstrating good control and form. No increase in back soreness noted throughout session.   EVAL: Patient is a 68 y.o. female who was seen today for physical therapy evaluation and treatment for muscular deconditioning following radiation and chemotherapy for lymphoma. She has kept up with walking for daily exercise, but reports ongoing weakness and fatigue as a result of lymphoma and resultant treatment. Upon assessment she is noted to have generalized weakness and has difficulty with functional tasks (lifting and transfers). She will benefit from skilled PT to address the above stated deficits in order to optimize her function.     OBJECTIVE IMPAIRMENTS: decreased activity tolerance, decreased balance, decreased endurance, decreased strength, impaired sensation, improper body mechanics, postural dysfunction, and pain.   ACTIVITY LIMITATIONS: carrying, lifting, bending, transfers, and locomotion level  PARTICIPATION LIMITATIONS: meal prep,  cleaning, laundry, community activity, and yard work  PERSONAL FACTORS: Age, Fitness, Past/current experiences, Time since onset of injury/illness/exacerbation, and 3+ comorbidities: see PMH above are also affecting patient's functional outcome.   REHAB POTENTIAL: Good  CLINICAL DECISION MAKING: Evolving/moderate complexity  EVALUATION COMPLEXITY: Moderate   GOALS: Goals reviewed with patient? Yes  SHORT TERM GOALS: Target date: 07/13/2024    Patient will be independent and compliant with initial HEP.   Baseline: initial HEP issued  Goal status: INITIAL  2.  Patient will be able to lift at least 5 lbs with proper form.  Baseline: see above  Goal status: INITIAL  3.  Patient will be able to complete lunge without UE support to work towards independent floor transfers.  Baseline: unable  Goal status: INITIAL   LONG TERM GOALS: Target date: 08/29/24  Patient will score at least a 5 on PSFS to signify clinically meaningful improvement in functional abilities.  Baseline: see above Goal status: INITIAL  2.  Patient will demonstrate gross 4+/5 BUE strength to improve ability to lift and carry items.  Baseline: see above Goal status: INITIAL  3.  Patient will demonstrate gross 4+/5 BLE strength to improve stability with transfers.  Baseline: see above Goal status: INITIAL  4.  Patient will be independent with advanced home program, including gym activity to progress/maintain current level of function.  Baseline: initial HEP issued  Goal status: INITIAL   PLAN:  PT FREQUENCY: 1-2x/week  PT DURATION: 12 weeks  PLANNED INTERVENTIONS: 97164- PT Re-evaluation, 97750- Physical Performance Testing, 97110-Therapeutic exercises, 97530- Therapeutic activity, [id number]- Neuromuscular re-education, 97535- Self Care, [id number]- Manual therapy, and Cryotherapy.  PLAN FOR NEXT SESSION: review and update HEP; begin with gentle strengthening, allowing for rest breaks as needed. Functional  strengthening as tolerated.    Antoni Stefan, PT, DPT, ATC 06/11/24 11:42 AM

## 2024-06-16 ENCOUNTER — Ambulatory Visit

## 2024-06-16 DIAGNOSIS — M5459 Other low back pain: Secondary | ICD-10-CM | POA: Diagnosis not present

## 2024-06-16 DIAGNOSIS — R29898 Other symptoms and signs involving the musculoskeletal system: Secondary | ICD-10-CM | POA: Diagnosis not present

## 2024-06-16 DIAGNOSIS — M6281 Muscle weakness (generalized): Secondary | ICD-10-CM | POA: Diagnosis not present

## 2024-06-16 NOTE — Therapy (Signed)
 OUTPATIENT PHYSICAL THERAPY TREATMENT   Patient Name: Breanna Kirby MRN: [id number] DOB:[birth year], 68 y.o., female Today's Date: 06/16/2024  END OF SESSION:  PT End of Session - 06/16/24 1103     Visit Number 5    Number of Visits 24    Date for Recertification  08/29/24    Authorization Type BCBS MCR    Progress Note Due on Visit 10    PT Start Time 1103    PT Stop Time 1143    PT Time Calculation (min) 40 min    Activity Tolerance Patient tolerated treatment well    Behavior During Therapy [facility] for tasks assessed/performed             Past Medical History:  Diagnosis Date   Lymphoma (HCC)    Past Surgical History:  Procedure Laterality Date   CESAREAN SECTION     Patient Active Problem List   Diagnosis Date Noted   Osteoporosis 11/19/2022   Acute deep vein thrombosis (DVT) of femoral vein (HCC) 12/19/2017   Fatigue 12/03/2017   Essential hypertension 03/14/2016   Follicular lymphoma grade II of lymph nodes of multiple sites (HCC) 04/06/2015   Vitiligo 01/22/2012    PCP: Cleotilde Planas, MD   REFERRING PROVIDER:   [name], [name]    REFERRING DIAG: muscular deconditioning   Rationale for Evaluation and Treatment: Rehabilitation  THERAPY DIAG:  Muscle weakness (generalized)  Other symptoms and signs involving the musculoskeletal system  ONSET DATE: January 2025    SUBJECTIVE:                                                                                                                                                                                           SUBJECTIVE STATEMENT: Patient reports she has had a busy morning. Has been on a walk. Does have occasional mild soreness in the low back, but it does not bother her.   EVAL: Patient reports feeling a mass in the abdomen around January 2025. She was seen by oncology and began chemo very soon after for large B cell lymphoma. She received chemotherapy for about 6 months. Radiation began in June  and just completed this treatment in August. She has a PET scan scheduled in November. She felt like her body did pretty well with chemo. For 2 weeks after radiation she experienced diarrhea that was resolved with medication. She has lost a lot of weight since beginning treatment. She continues to walk for exercise daily. She tried to start very light weight-lifting, but wasn't really sure where to start. She reports her normal energy level is down a Brzozowski bit. She is  cautious with lifting activity. She will experience occasional back pain, but otherwise is doing ok. 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 She feels that she has lost a lot of muscle mass. She reports intermittent numbness/tingling in the toes.   PERTINENT HISTORY:  Lymphoma- recent chemotherapy and radiation   PAIN:  Are you having pain? No   PRECAUTIONS:  Other: cancer  RED FLAGS: None   WEIGHT BEARING RESTRICTIONS:  No  FALLS:  Has patient fallen in last 6 months? No  LIVING ENVIRONMENT: Lives with: lives with their spouse Lives in: House/apartment Stairs: Yes: Internal: flight steps; on left going up Has following equipment at home: None  OCCUPATION:  Retired   PLOF:  Independent  PATIENT GOALS:  I would like to get strength back and get the muscle tone.   NEXT MD VISIT: November 2025    OBJECTIVE:  Note: Objective measures were completed at Evaluation unless otherwise noted.  DIAGNOSTIC FINDINGS:  PET scan scheduled in November   PATIENT SURVEYS:  Patient-specific activity scoring scheme (Point to one number):  0 represents "unable to perform." 10 represents "able to perform at prior level. 0 1 2 3 4 5 6 7 8 9  10 (Date and Score) Activity Initial  Activity Eval     Lifting > 5 lbs   2    Floor transfer   3    Average 2.5    Additional Additional Total score = sum of the activity scores/number of activities Minimum detectable change  (90%CI) for average score = 2 points Minimum detectable change (90%CI) for single activity score = 3 points PSFS developed by: [name], [name], J. (1995). Assessing disability and change on individual  patients: a report of a patient specific measure. Physiotherapy [place], 47, [phone]. Reproduced with the permission of the authors     COGNITIVE STATUS: Within functional limits for tasks assessed   SENSATION: Reports numbness/tingling in toes     POSTURE:  forward head and increased thoracic kyphosis   GAIT: Distance walked: 20 ft  Assistive device utilized: None Level of assistance: Complete Independence Comments: no obvious gait abnormalities     UPPER EXTREMITY MMT:  MMT Right eval Left eval  Shoulder flexion 4- 4-  Shoulder extension    Shoulder abduction 5 5  Shoulder adduction    Shoulder extension    Shoulder internal rotation    Shoulder external rotation    Middle trapezius    Lower trapezius    Elbow flexion 4 4  Elbow extension 4 4  Wrist flexion    Wrist extension    Wrist ulnar deviation    Wrist radial deviation    Wrist pronation    Wrist supination    Grip strength     (Blank rows = not tested)   LOWER EXTREMITY MMT:    MMT Right eval Left eval  Hip flexion 4- 4-  Hip extension 4 4  Hip abduction 4 4  Hip adduction    Hip internal rotation    Hip external rotation    Knee flexion 4 4  Knee extension 4 4  Ankle dorsiflexion 4- 4-  Ankle plantarflexion    Ankle inversion    Ankle eversion     (Blank rows = not tested)    FUNCTIONAL TESTS:  5 x STS: 10.94 seconds  Squat: limited depth patient reports she feels weak.     [facility]  Adult PT Treatment:                                                DATE: 06/16/24 Therapeutic Exercise: Bicep curl 3 x 10 @ 3 lbs   Neuromuscular re-ed: Hooklying TA brace x 10  Seated TA march 2 x 10  Seated TA march with opposite arm reach 2 x 10  Therapeutic  Activity: Bodyweight squats 2 x 10  Forward Step ups on aerobic stepper +2 inserts 2 x 10  Lateral step ups on aerobic stepper +2 inserts 2 x 10     [facility] Adult PT Treatment:                                                DATE: 06/11/24 Therapeutic Exercise: NuStep level 5 x UE/LE  Tricep extension 3 x 10 @ 2 lbs   Therapeutic Activity: Seated hip hinge with dowel x 10  Standing hip hinge with dowel x 10 Standing hip hinge holding dowel in front x 10  Leg press 3 x 10 @ 40 lbs  Curl to overhead press 3 x 10 @ 2 lbs     [facility] Adult PT Treatment:                                                DATE: 06/08/24 Therapeutic Exercise: Standing hip abduction 3 x 10 @ red band  Standing hip extension 3 x 10 @ red band  LAQ 3 x 10 @ 2 lbs   Therapeutic Activity: Standing march 3 x 10  Sit to stand 1 x 10 @ 5 lbs  Curl to overhead press 3 x 10 @ 1 lb   Self Care: Gym recommendations Potential benefit of purchasing ankle weights for strength training  Proper Lifting education-demo and returned demo   [facility] Adult PT Treatment:                                                DATE: 06/04/24 Therapeutic Exercise: Seated hip abduction 3 x 10; red band  Seated hip flexion 3 x 10; red band  Reviewed and updated HEP   Therapeutic Activity: Seated shoulder flexion 1# 3 x 10  Standing shoulder abduction 1# 3 x 10   Self Care: Nutritional considerations with recommendations on tracking calories, protein; use of fitness app for calculating/tracking or f/u with nutritionist; recommendations on where to purchase protein shakes.       PATIENT EDUCATION:  Education details: HEP update  Person educated: Patient Education method: Explanation Education comprehension: verbalized understanding  HOME EXERCISE PROGRAM: Access Code: [email or web address]  URL: [email or web address] Date: 06/16/2024 Prepared by: Lucie Meeter  Program Notes split lower body and upper body strengthening  seperately during the day.   Exercises - Squat  - 1 x daily - 7 x weekly - 2-3 sets - 10 reps - Standing Hip Abduction with Resistance at Ankles and Counter Support  - 1 x daily - 7  x weekly - 2-3 sets - 10 reps - Standing Hip Extension with Resistance at Ankles and Counter Support  - 1 x daily - 7 x weekly - 2-3 sets - 10 reps - Standing Marching  - 1 x daily - 7 x weekly - 2-3 sets - 10 reps - Heel Toe Raises with Counter Support  - 1 x daily - 7 x weekly - 2-3 sets - 8-10 reps - Wall Push Up  - 1 x daily - 7 x weekly - 2-3 sets - 8-10 reps - Standing Single Arm Elbow Flexion with Resistance  - 1 x daily - 7 x weekly - 2-3 sets - 8-10 reps - Seated Shoulder Flexion with Dumbbells  - 1 x daily - 7 x weekly - 2-3 sets - 10 reps - Seated Shoulder Abduction with Dumbbells - Thumbs Up  - 1 x daily - 7 x weekly - 2-3 sets - 10 reps   ASSESSMENT:  CLINICAL IMPRESSION: Focused on core stabilization today given patient's complaints of occasional low back soreness. She does not tolerate supine exercises well due to congential protruding coccyx, so completed most of the core stabilization work in sitting with good tolerance. Continued with functional strengthening today with patient demonstrating good form with bodyweight squats.   EVAL: Patient is a 68 y.o. female who was seen today for physical therapy evaluation and treatment for muscular deconditioning following radiation and chemotherapy for lymphoma. She has kept up with walking for daily exercise, but reports ongoing weakness and fatigue as a result of lymphoma and resultant treatment. Upon assessment she is noted to have generalized weakness and has difficulty with functional tasks (lifting and transfers). She will benefit from skilled PT to address the above stated deficits in order to optimize her function.     OBJECTIVE IMPAIRMENTS: decreased activity tolerance, decreased balance, decreased endurance, decreased strength, impaired sensation,  improper body mechanics, postural dysfunction, and pain.   ACTIVITY LIMITATIONS: carrying, lifting, bending, transfers, and locomotion level  PARTICIPATION LIMITATIONS: meal prep, cleaning, laundry, community activity, and yard work  PERSONAL FACTORS: Age, Fitness, Past/current experiences, Time since onset of injury/illness/exacerbation, and 3+ comorbidities: see PMH above are also affecting patient's functional outcome.   REHAB POTENTIAL: Good  CLINICAL DECISION MAKING: Evolving/moderate complexity  EVALUATION COMPLEXITY: Moderate   GOALS: Goals reviewed with patient? Yes  SHORT TERM GOALS: Target date: 07/13/2024    Patient will be independent and compliant with initial HEP.   Baseline: initial HEP issued  Goal status: MET  2.  Patient will be able to lift at least 5 lbs with proper form.  Baseline: see above  Goal status: INITIAL  3.  Patient will be able to complete lunge without UE support to work towards independent floor transfers.  Baseline: unable  Goal status: INITIAL   LONG TERM GOALS: Target date: 08/29/24  Patient will score at least a 5 on PSFS to signify clinically meaningful improvement in functional abilities.  Baseline: see above Goal status: INITIAL  2.  Patient will demonstrate gross 4+/5 BUE strength to improve ability to lift and carry items.  Baseline: see above Goal status: INITIAL  3.  Patient will demonstrate gross 4+/5 BLE strength to improve stability with transfers.  Baseline: see above Goal status: INITIAL  4.  Patient will be independent with advanced home program, including gym activity to progress/maintain current level of function.  Baseline: initial HEP issued  Goal status: INITIAL   PLAN:  PT FREQUENCY: 1-2x/week  PT DURATION: 12 weeks  PLANNED INTERVENTIONS: 97164- PT Re-evaluation, 97750- Physical Performance Testing, 97110-Therapeutic exercises, 97530- Therapeutic activity, [id number]- Neuromuscular re-education, 97535-  Self Care, [id number]- Manual therapy, and Cryotherapy.  PLAN FOR NEXT SESSION: review and update HEP; begin with gentle strengthening, allowing for rest breaks as needed. Functional strengthening as tolerated.    Erving Sassano, PT, DPT, ATC 06/16/24 11:46 AM

## 2024-06-17 NOTE — Therapy (Addendum)
 OUTPATIENT PHYSICAL THERAPY TREATMENT   Patient Name: Breanna Kirby MRN: [id number] DOB:09/18/1956, 68 y.o., female Today's Date: 06/22/2024  END OF SESSION:  PT End of Session - 06/22/24 1120     Visit Number 7    Number of Visits 24    Date for Recertification  08/29/24    Authorization Type BCBS MCR    Progress Note Due on Visit 10    PT Start Time 1020    PT Stop Time 1100    PT Time Calculation (min) 40 min    Activity Tolerance Patient tolerated treatment well    Behavior During Therapy [facility] for tasks assessed/performed              Past Medical History:  Diagnosis Date   Lymphoma (HCC)    Past Surgical History:  Procedure Laterality Date   CESAREAN SECTION     Patient Active Problem List   Diagnosis Date Noted   Osteoporosis 11/19/2022   Acute deep vein thrombosis (DVT) of femoral vein (HCC) 12/19/2017   Fatigue 12/03/2017   Essential hypertension 03/14/2016   Follicular lymphoma grade II of lymph nodes of multiple sites (HCC) 04/06/2015   Vitiligo 01/22/2012    PCP: Cleotilde Planas, MD   REFERRING PROVIDER:   [name], [name]    REFERRING DIAG: muscular deconditioning   Rationale for Evaluation and Treatment: Rehabilitation  THERAPY DIAG:  Other low back pain  Muscle weakness (generalized)  Other symptoms and signs involving the musculoskeletal system  ONSET DATE: January 2025    SUBJECTIVE:                                                                                                                                                                                           SUBJECTIVE STATEMENT: Pt reports having mild soreness in the lower back and abdomen from last session but no pain. Pt states doing yard activities yesterday including sweeping and cleaning around the house, but was not too tired.   EVAL: Patient reports feeling a mass in the abdomen around January 2025. She was seen by oncology and began chemo very soon after for  large B cell lymphoma. She received chemotherapy for about 6 months. Radiation began in June and just completed this treatment in August. She has a PET scan scheduled in November. She felt like her body did pretty well with chemo. For 2 weeks after radiation she experienced diarrhea that was resolved with medication. She has lost a lot of weight since beginning treatment. She continues to walk for exercise daily. She tried to start very light weight-lifting, but wasn't really sure where to start.  She reports her normal energy level is down a Bruins bit. She is cautious with lifting activity. She will experience occasional back pain, but otherwise is doing ok. 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 She feels that she has lost a lot of muscle mass. She reports intermittent numbness/tingling in the toes.   PERTINENT HISTORY:  Lymphoma- recent chemotherapy and radiation   PAIN:  Are you having pain? No   PRECAUTIONS:  Other: cancer  RED FLAGS: None   WEIGHT BEARING RESTRICTIONS:  No  FALLS:  Has patient fallen in last 6 months? No  LIVING ENVIRONMENT: Lives with: lives with their spouse Lives in: House/apartment Stairs: Yes: Internal: flight steps; on left going up Has following equipment at home: None  OCCUPATION:  Retired   PLOF:  Independent  PATIENT GOALS:  I would like to get strength back and get the muscle tone.   NEXT MD VISIT: November 2025    OBJECTIVE:  Note: Objective measures were completed at Evaluation unless otherwise noted.  DIAGNOSTIC FINDINGS:  PET scan scheduled in November   PATIENT SURVEYS:  Patient-specific activity scoring scheme (Point to one number):  0 represents "unable to perform." 10 represents "able to perform at prior level. 0 1 2 3 4 5 6 7 8 9  10 (Date and Score) Activity Initial  Activity Eval     Lifting > 5 lbs   2    Floor transfer   3    Average 2.5     Additional Additional Total score = sum of the activity scores/number of activities Minimum detectable change (90%CI) for average score = 2 points Minimum detectable change (90%CI) for single activity score = 3 points PSFS developed by: [name], [name], J. (1995). Assessing disability and change on individual  patients: a report of a patient specific measure. Physiotherapy [place], 47, [phone]. Reproduced with the permission of the authors     COGNITIVE STATUS: Within functional limits for tasks assessed   SENSATION: Reports numbness/tingling in toes     POSTURE:  forward head and increased thoracic kyphosis   GAIT: Distance walked: 20 ft  Assistive device utilized: None Level of assistance: Complete Independence Comments: no obvious gait abnormalities     UPPER EXTREMITY MMT:  MMT Right eval Left eval  Shoulder flexion 4- 4-  Shoulder extension    Shoulder abduction 5 5  Shoulder adduction    Shoulder extension    Shoulder internal rotation    Shoulder external rotation    Middle trapezius    Lower trapezius    Elbow flexion 4 4  Elbow extension 4 4  Wrist flexion    Wrist extension    Wrist ulnar deviation    Wrist radial deviation    Wrist pronation    Wrist supination    Grip strength     (Blank rows = not tested)   LOWER EXTREMITY MMT:    MMT Right eval Left eval  Hip flexion 4- 4-  Hip extension 4 4  Hip abduction 4 4  Hip adduction    Hip internal rotation    Hip external rotation    Knee flexion 4 4  Knee extension 4 4  Ankle dorsiflexion 4- 4-  Ankle plantarflexion    Ankle inversion    Ankle eversion     (Blank rows = not tested)    FUNCTIONAL TESTS:  5 x STS: 10.94 seconds  Squat: limited depth patient reports she feels weak.   [facility] Adult PT Treatment:                                                DATE: 06/21/24 Therapeutic Exercise: Standing bicep curl +OH lift with 3 lbs bilaterally  x10  Sidelying clamshells ER and IR using 1# ankle weights (weight on ankle for IR, weight on distal femur for ER) 2x10 each side  Neuromuscular re-ed: Dynadisc marching in seated 2x10- educated on breathing techniques Bicep Curl bilaterally 2x10 @ 3lbs  Self Care: Discussed breathing techniques and inhalation awareness during exercise to promote increased oxygenated tissues.  Discussed potential to be approved for attending oncology aerobic group Explained mind to muscle connection throughout exercises   [facility] Adult PT Treatment:                                                DATE: 06/16/24 Therapeutic Exercise: Bicep curl 3 x 10 @ 3 lbs  Shoulder flexion with 1 or 2 lbs weights   Neuromuscular re-ed: Hooklying TA brace x 10  Seated TA march 2 x 10  Seated TA march with opposite arm reach 2 x 10  Therapeutic Activity: Bodyweight squats 2 x 10  Forward Step ups on aerobic stepper +2 inserts 2 x 10  Lateral step ups on aerobic stepper +2 inserts 2 x 10     [facility] Adult PT Treatment:                                                DATE: 06/11/24 Therapeutic Exercise: NuStep level 5 x UE/LE  Tricep extension 3 x 10 @ 2 lbs   Therapeutic Activity: Seated hip hinge with dowel x 10  Standing hip hinge with dowel x 10 Standing hip hinge holding dowel in front x 10  Leg press 3 x 10 @ 40 lbs  Curl to overhead press 3 x 10 @ 2 lbs     [facility] Adult PT Treatment:                                                DATE: 06/08/24 Therapeutic Exercise: Standing hip abduction 3 x 10 @ red band  Standing hip extension 3 x 10 @ red band  LAQ 3 x 10 @ 2 lbs   Therapeutic Activity: Standing march 3 x 10  Sit to stand 1 x 10 @ 5 lbs  Curl to overhead press 3 x 10 @ 1 lb   Self Care: Gym recommendations Potential benefit of purchasing ankle weights for strength training  Proper Lifting education-demo and returned demo   [facility] Adult PT Treatment:                                                 DATE: 06/04/24  Therapeutic Exercise: Seated hip abduction 3 x 10; red band  Seated hip flexion 3 x 10; red band  Reviewed and updated HEP   Therapeutic Activity: Seated shoulder flexion 1# 3 x 10  Standing shoulder abduction 1# 3 x 10   Self Care: Nutritional considerations with recommendations on tracking calories, protein; use of fitness app for calculating/tracking or f/u with nutritionist; recommendations on where to purchase protein shakes.       PATIENT EDUCATION:  Education details: Provided updated HEP handout Person educated: Patient Education method: Explanation Education comprehension: verbalized understanding  HOME EXERCISE PROGRAM: Access Code: [email or web address]  URL: [email or web address] Date: 06/22/2024 Prepared by: Lucie Meeter Program Notes split lower body and upper body strengthening seperately during the day. Exercises  - Squat  - 1 x daily - 7 x weekly - 2-3 sets - 10 reps  - Standing Hip Abduction with Resistance at Ankles and Counter Support  - 1 x daily - 7 x weekly - 2-3 sets - 10 reps  - Standing Hip Extension with Resistance at Ankles and Counter Support  - 1 x daily - 7 x weekly - 2-3 sets - 10 reps  - Standing Marching  - 1 x daily - 7 x weekly - 2-3 sets - 10 reps  - Heel Toe Raises with Counter Support  - 1 x daily - 7 x weekly - 2-3 sets - 8-10 reps  - Wall Push Up  - 1 x daily - 7 x weekly - 2-3 sets - 8-10 reps  - Standing Single Arm Elbow Flexion with Resistance  - 1 x daily - 7 x weekly - 2-3 sets - 8-10 reps  - Seated Shoulder Flexion with Dumbbells  - 1 x daily - 7 x weekly - 2-3 sets - 10 reps  - Seated Shoulder Abduction with Dumbbells - Thumbs Up  - 1 x daily - 7 x weekly - 2-3 sets - 10 reps  - Sidelying Reverse Clamshell  - 1 x daily - 7 x weekly - 2-3 sets - 10 reps  - Clamshell  - 1 x daily - 7 x weekly - 2-3 sets - 10 reps  ASSESSMENT:  CLINICAL IMPRESSION:   Pt tolerated new exercises well including sidelying clamshells with  weight , however needed pillow support under knees stacked to prevent tissue compression. Weight added to UE exercises that pt tolerated well, progressing further to meet goal of safely lifting 5 lbs. Pt's position of tailbone closely monitored during exercises for protecting bone protrusion during movement. Pt will benefit from continuing skilled therapy to address the deficits below.   EVAL: Patient is a 68 y.o. female who was seen today for physical therapy evaluation and treatment for muscular deconditioning following radiation and chemotherapy for lymphoma. She has kept up with walking for daily exercise, but reports ongoing weakness and fatigue as a result of lymphoma and resultant treatment. Upon assessment she is noted to have generalized weakness and has difficulty with functional tasks (lifting and transfers). She will benefit from skilled PT to address the above stated deficits in order to optimize her function.     OBJECTIVE IMPAIRMENTS: decreased activity tolerance, decreased balance, decreased endurance, decreased strength, impaired sensation, improper body mechanics, postural dysfunction, and pain.   ACTIVITY LIMITATIONS: carrying, lifting, bending, transfers, and locomotion level  PARTICIPATION LIMITATIONS: meal prep, cleaning, laundry, community activity, and yard work  PERSONAL FACTORS: Age, Fitness, Past/current experiences, Time since onset of injury/illness/exacerbation, and 3+ comorbidities: see PMH above are also affecting patient's  functional outcome.   REHAB POTENTIAL: Good  CLINICAL DECISION MAKING: Evolving/moderate complexity  EVALUATION COMPLEXITY: Moderate   GOALS: Goals reviewed with patient? Yes  SHORT TERM GOALS: Target date: 07/13/2024    Patient will be independent and compliant with initial HEP.   Baseline: initial HEP issued  Goal status: MET  2.  Patient will be able to lift at least 5 lbs with proper form.  Baseline: see above  Goal status:  Progressing  3.  Patient will be able to complete lunge without UE support to work towards independent floor transfers.  Baseline: unable  Goal status: INITIAL   LONG TERM GOALS: Target date: 08/29/24  Patient will score at least a 5 on PSFS to signify clinically meaningful improvement in functional abilities.  Baseline: see above Goal status: INITIAL  2.  Patient will demonstrate gross 4+/5 BUE strength to improve ability to lift and carry items.  Baseline: see above Goal status: INITIAL  3.  Patient will demonstrate gross 4+/5 BLE strength to improve stability with transfers.  Baseline: see above Goal status: INITIAL  4.  Patient will be independent with advanced home program, including gym activity to progress/maintain current level of function.  Baseline: initial HEP issued  Goal status: INITIAL   PLAN:  PT FREQUENCY: 1-2x/week  PT DURATION: 12 weeks  PLANNED INTERVENTIONS: 97164- PT Re-evaluation, 97750- Physical Performance Testing, 97110-Therapeutic exercises, 97530- Therapeutic activity, [id number]- Neuromuscular re-education, 97535- Self Care, [id number]- Manual therapy, and Cryotherapy.  PLAN FOR NEXT SESSION: review and update HEP; begin with gentle strengthening, allowing for rest breaks as needed. Functional strengthening as tolerated.    Lavanda Cleverly, SPT 06/22/24 1:30 PM

## 2024-06-18 ENCOUNTER — Ambulatory Visit

## 2024-06-18 DIAGNOSIS — M6281 Muscle weakness (generalized): Secondary | ICD-10-CM

## 2024-06-18 DIAGNOSIS — M5459 Other low back pain: Secondary | ICD-10-CM | POA: Diagnosis not present

## 2024-06-18 DIAGNOSIS — R29898 Other symptoms and signs involving the musculoskeletal system: Secondary | ICD-10-CM | POA: Diagnosis not present

## 2024-06-18 NOTE — Therapy (Addendum)
 OUTPATIENT PHYSICAL THERAPY TREATMENT   Patient Name: Breanna Kirby MRN: [id number] DOB:Jul 31, 1956, 68 y.o., female Today's Date: 06/18/2024  END OF SESSION:  PT End of Session - 06/18/24 1123     Visit Number 6    Number of Visits 24    Date for Recertification  08/29/24    Authorization Type BCBS MCR    Progress Note Due on Visit 10    PT Start Time 1015    PT Stop Time 1100    PT Time Calculation (min) 45 min    Activity Tolerance Patient tolerated treatment well    Behavior During Therapy [facility] for tasks assessed/performed              Past Medical History:  Diagnosis Date   Lymphoma (HCC)    Past Surgical History:  Procedure Laterality Date   CESAREAN SECTION     Patient Active Problem List   Diagnosis Date Noted   Osteoporosis 11/19/2022   Acute deep vein thrombosis (DVT) of femoral vein (HCC) 12/19/2017   Fatigue 12/03/2017   Essential hypertension 03/14/2016   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 She states she has a good level of energy. She goes up and down stairs and doesn't feel winded.   EVAL: Patient reports feeling a mass in the abdomen around January 2025. She was seen by oncology and began chemo very soon after for large B cell lymphoma. She received chemotherapy for about 6 months. Radiation began in June and just completed this treatment in August. She has a PET  scan scheduled in November. She felt like her body did pretty well with chemo. For 2 weeks after radiation she experienced diarrhea that was resolved with medication. She has lost a lot of weight since beginning treatment. She continues to walk for exercise daily. She tried to start very light weight-lifting, but wasn't really sure where to start. She reports her normal energy level is down a Bencosme bit. She is cautious with lifting activity. She will experience occasional  back pain, but otherwise is doing ok. 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 She feels that she has lost a lot of muscle mass. She reports intermittent numbness/tingling in the toes.   PERTINENT HISTORY:  Lymphoma- recent chemotherapy and radiation   PAIN:  Are you having pain? No 06/18/24   PRECAUTIONS:  Other: cancer  RED FLAGS: None   WEIGHT BEARING RESTRICTIONS:  No  FALLS:  Has patient fallen in last 6 months? No  LIVING ENVIRONMENT: Lives with: lives with their spouse Lives in: House/apartment Stairs: Yes: Internal: flight steps; on left going up Has following equipment at home: None  OCCUPATION:  Retired   PLOF:  Independent  PATIENT GOALS:  I would like to get strength back and get the muscle tone.   NEXT MD VISIT: November 2025    OBJECTIVE:  Note: Objective measures were completed at Evaluation unless otherwise noted.  DIAGNOSTIC FINDINGS:  PET scan scheduled in November   PATIENT SURVEYS:  Patient-specific activity scoring scheme (Point to one number):  0 represents "unable to perform." 10 represents "able to perform at prior level. 0 1 2 3 4 5 6 7 8 9  10 (Date and Score) Activity Initial  Activity Eval     Lifting > 5 lbs   2    Floor transfer   3    Average 2.5    Additional Additional Total score = sum of the activity scores/number of activities Minimum detectable change (90%CI) for average score = 2 points Minimum  detectable change (90%CI) for single activity score = 3 points PSFS developed by: [name], [name], J. (1995). Assessing disability and change on individual  patients: a report of a patient specific measure. Physiotherapy [place], 47, [phone]. Reproduced with the permission of the authors     COGNITIVE STATUS: Within functional limits for tasks assessed   SENSATION: Reports numbness/tingling in toes     POSTURE:  forward head and increased thoracic kyphosis   GAIT: Distance walked: 20 ft  Assistive device utilized: None Level of assistance: Complete Independence Comments: no obvious gait abnormalities     UPPER EXTREMITY MMT:  MMT Right eval Left eval  Shoulder flexion 4- 4-  Shoulder extension    Shoulder abduction 5 5  Shoulder adduction    Shoulder extension    Shoulder internal rotation    Shoulder external rotation    Middle trapezius    Lower trapezius    Elbow flexion 4 4  Elbow extension 4 4  Wrist flexion    Wrist extension    Wrist ulnar deviation    Wrist radial deviation    Wrist pronation    Wrist supination    Grip strength     (Blank rows = not tested)   LOWER EXTREMITY MMT:    MMT Right eval Left eval  Hip flexion 4- 4-  Hip extension 4 4  Hip abduction 4 4  Hip adduction    Hip internal rotation    Hip external rotation    Knee flexion 4 4  Knee extension 4 4  Ankle dorsiflexion 4- 4-  Ankle plantarflexion    Ankle inversion    Ankle eversion     (Blank rows = not tested)    FUNCTIONAL TESTS:  5 x STS: 10.94 seconds  Squat: limited depth patient reports she feels weak.   [facility] Adult PT Treatment:  DATE: 06/18/24 Therapeutic Exercise: Standing biceps curls 2x10 each side with 2 lb weights Standing bicep curl +OH lift with 2 lbs bilaterally x10   Neuromuscular re-ed: Dynadisc marching x20 bilaterally for TA activation and improving coordination   Gentle ROM oblique small movement twists on dynadisc holding 2 lb weight x20 for core activation  Therapeutic Activity: Squats x15 - verbal cues to keep knees in front of toes, and to straighten out back when descending  Side stepping width of ortho gym 2 laps (1 lap is down and back) Walking different directions for walking program with osteoporosis 2 min  Forward Step ups on stairs 2 x 10 up right right, down with right/up with left, down with left  Self Care: Discussed how to listen to your body and knowing when to stop motion for fatigue prevention    [facility] Adult PT Treatment:                                                DATE: 06/16/24 Therapeutic Exercise: Bicep curl 3 x 10 @ 3 lbs   Neuromuscular re-ed: Hooklying TA brace x 10  Seated TA march 2 x 10  Seated TA march with opposite arm reach 2 x 10  Therapeutic Activity: Bodyweight squats 2 x 10  Forward Step ups on aerobic stepper +2 inserts 2 x 10  Lateral step ups on aerobic stepper +2 inserts 2 x 10     [facility] Adult PT Treatment:                                                DATE: 06/11/24 Therapeutic Exercise: NuStep level 5 x UE/LE  Tricep extension 3 x 10 @ 2 lbs   Therapeutic Activity: Seated hip hinge with dowel x 10  Standing hip hinge with dowel x 10 Standing hip hinge holding dowel in front x 10  Leg press 3 x 10 @ 40 lbs  Curl to overhead press 3 x 10 @ 2 lbs     [facility] Adult PT Treatment:                                                DATE: 06/08/24 Therapeutic Exercise: Standing hip abduction 3 x 10 @ red band  Standing hip extension 3 x 10 @ red band  LAQ 3 x 10 @ 2 lbs   Therapeutic Activity: Standing march 3 x 10  Sit to stand 1 x 10 @ 5 lbs  Curl to overhead press 3 x 10 @ 1 lb   Self Care: Gym recommendations Potential benefit of purchasing ankle weights for strength training  Proper Lifting education-demo and returned demo   [facility] Adult PT Treatment:                                                 DATE: 06/04/24 Therapeutic Exercise: Seated hip abduction 3 x 10; red band  Seated hip flexion 3 x 10; red  band  Reviewed and updated HEP   Therapeutic Activity: Seated shoulder flexion 1# 3 x 10  Standing shoulder abduction 1# 3 x 10   Self Care: Nutritional considerations with recommendations on tracking calories, protein; use of fitness app for calculating/tracking or f/u with nutritionist; recommendations on where to purchase protein shakes.       PATIENT EDUCATION:  Education details: See treatment above Person educated: Patient Education method: Explanation Education comprehension: verbalized understanding  HOME EXERCISE PROGRAM: Access Code: [email or web address]  URL: [email or web address] Date: 06/16/2024 Prepared by: Lucie Meeter  Program Notes split lower body and upper body strengthening seperately during the day.   Exercises - Squat  - 1 x daily - 7 x weekly - 2-3 sets - 10 reps - Standing Hip Abduction with Resistance at Ankles and Counter Support  - 1 x daily - 7 x weekly - 2-3 sets - 10 reps - Standing Hip Extension with Resistance at Ankles and Counter Support  - 1 x daily - 7 x weekly - 2-3 sets - 10 reps - Standing Marching  - 1 x daily - 7 x weekly - 2-3 sets - 10 reps - Heel Toe Raises with Counter Support  - 1 x daily - 7 x weekly - 2-3 sets - 8-10 reps - Wall Push Up  - 1 x daily - 7 x weekly - 2-3 sets - 8-10 reps - Standing Single Arm Elbow Flexion with Resistance  - 1 x daily - 7 x weekly - 2-3 sets - 8-10 reps - Seated Shoulder Flexion with Dumbbells  - 1 x daily - 7 x weekly - 2-3 sets - 10 reps - Seated Shoulder Abduction with Dumbbells - Thumbs Up  - 1 x daily - 7 x weekly - 2-3 sets - 10 reps   ASSESSMENT:  CLINICAL IMPRESSION:  Pt focused on core activation and gait training to promote functional strengthening, monitoring pt's osteoporosis. Pt tolerated dynadisc exercises well to improve core strength, however required modification  for compression of tailbone during marches. Pt did well with 2 lb arm exercises, stating she liked those for UE movement. Pt required verbal cues for proper form during squats. Pt will benefit from continuing skilled therapy to address the deficits below.   OBJECTIVE IMPAIRMENTS: decreased activity tolerance, decreased balance, decreased endurance, decreased strength, impaired sensation, improper body mechanics, postural dysfunction, and pain.   ACTIVITY LIMITATIONS: carrying, lifting, bending, transfers, and locomotion level  PARTICIPATION LIMITATIONS: meal prep, cleaning, laundry, community activity, and yard work  PERSONAL FACTORS: Age, Fitness, Past/current experiences, Time since onset of injury/illness/exacerbation, and 3+ comorbidities: see PMH above are also affecting patient's functional outcome.   REHAB POTENTIAL: Good  CLINICAL DECISION MAKING: Evolving/moderate complexity  EVALUATION COMPLEXITY: Moderate   GOALS: Goals reviewed with patient? Yes  SHORT TERM GOALS: Target date: 07/13/2024    Patient will be independent and compliant with initial HEP.   Baseline: initial HEP issued  Goal status: MET  2.  Patient will be able to lift at least 5 lbs with proper form.  Baseline: see above  Goal status: INITIAL  3.  Patient will be able to complete lunge without UE support to work towards independent floor transfers.  Baseline: unable  Goal status: INITIAL   LONG TERM GOALS: Target date: 08/29/24  Patient will score at least a 5 on PSFS to signify clinically meaningful improvement in functional abilities.  Baseline: see above Goal status: INITIAL  2.  Patient will demonstrate gross 4+/5 BUE strength to improve ability  to lift and carry items.  Baseline: see above Goal status: INITIAL  3.  Patient will demonstrate gross 4+/5 BLE strength to improve stability with transfers.  Baseline: see above Goal status: INITIAL  4.  Patient will be independent with advanced  home program, including gym activity to progress/maintain current level of function.  Baseline: initial HEP issued  Goal status: INITIAL   PLAN:  PT FREQUENCY: 1-2x/week  PT DURATION: 12 weeks  PLANNED INTERVENTIONS: 97164- PT Re-evaluation, 97750- Physical Performance Testing, 97110-Therapeutic exercises, 97530- Therapeutic activity, [id number]- Neuromuscular re-education, 97535- Self Care, [id number]- Manual therapy, and Cryotherapy.  PLAN FOR NEXT SESSION: review and update HEP; begin with gentle strengthening, allowing for rest breaks as needed. Functional strengthening as tolerated, walking program, arm exercises*  Lavanda Cleverly, SPT 06/18/24 12:30 PM  Lavanda Cleverly, SPT 06/18/24 12:54 PM   Lucie Meeter, PT, DPT, ATC 06/18/24 12:30 PM

## 2024-06-22 ENCOUNTER — Ambulatory Visit

## 2024-06-22 DIAGNOSIS — R29898 Other symptoms and signs involving the musculoskeletal system: Secondary | ICD-10-CM

## 2024-06-22 DIAGNOSIS — M5459 Other low back pain: Secondary | ICD-10-CM

## 2024-06-22 DIAGNOSIS — M6281 Muscle weakness (generalized): Secondary | ICD-10-CM

## 2024-06-24 ENCOUNTER — Ambulatory Visit

## 2024-06-24 DIAGNOSIS — R278 Other lack of coordination: Secondary | ICD-10-CM | POA: Insufficient documentation

## 2024-06-24 DIAGNOSIS — R252 Cramp and spasm: Secondary | ICD-10-CM | POA: Diagnosis not present

## 2024-06-24 DIAGNOSIS — M5459 Other low back pain: Secondary | ICD-10-CM | POA: Insufficient documentation

## 2024-06-24 DIAGNOSIS — R29898 Other symptoms and signs involving the musculoskeletal system: Secondary | ICD-10-CM | POA: Diagnosis not present

## 2024-06-24 DIAGNOSIS — M6281 Muscle weakness (generalized): Secondary | ICD-10-CM | POA: Diagnosis not present

## 2024-06-24 NOTE — Therapy (Signed)
 OUTPATIENT PHYSICAL THERAPY TREATMENT   Patient Name: Breanna Kirby MRN: [id number] DOB:May 26, 1956, 68 y.o., female Today's Date: 06/24/2024  END OF SESSION:  PT End of Session - 06/24/24 0934     Visit Number 8    Number of Visits 24    Date for Recertification  08/29/24    Authorization Type BCBS MCR    Progress Note Due on Visit 10    PT Start Time 0934    PT Stop Time 1014    PT Time Calculation (min) 40 min    Activity Tolerance Patient tolerated treatment well    Behavior During Therapy [facility] for tasks assessed/performed               Past Medical History:  Diagnosis Date   Lymphoma [facility])    Past Surgical History:  Procedure Laterality Date   CESAREAN SECTION     Patient Active Problem List   Diagnosis Date Noted   Osteoporosis 11/19/2022   Acute deep vein thrombosis (DVT) of femoral vein (HCC) 12/19/2017   Fatigue 12/03/2017   Essential hypertension 03/14/2016   Follicular lymphoma grade II of lymph nodes of multiple sites (HCC) 04/06/2015   Vitiligo 01/22/2012    PCP: Cleotilde Planas, MD   REFERRING PROVIDER:   [name], [name]    REFERRING DIAG: muscular deconditioning   Rationale for Evaluation and Treatment: Rehabilitation  THERAPY DIAG:  Muscle weakness (generalized)  Other symptoms and signs involving the musculoskeletal system  ONSET DATE: January 2025    SUBJECTIVE:                                                                                                                                                                                           SUBJECTIVE STATEMENT: Patient reports every once in awhile she will feel a twinge in her back and abs. No pain currently. Is interested in some stretches to complete daily before walking.   EVAL: Patient reports feeling a mass in the abdomen around January 2025. She was seen by oncology and began chemo very soon after for large B cell lymphoma. She received chemotherapy for about 6  months. Radiation began in June and just completed this treatment in August. She has a PET scan scheduled in November. She felt like her body did pretty well with chemo. For 2 weeks after radiation she experienced diarrhea that was resolved with medication. She has lost a lot of weight since beginning treatment. She continues to walk for exercise daily. She tried to start very light weight-lifting, but wasn't really sure where to start. She reports her normal energy level is down a Corella  bit. She is cautious with lifting activity. She will experience occasional back pain, but otherwise is doing ok. 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 She feels that she has lost a lot of muscle mass. She reports intermittent numbness/tingling in the toes.   PERTINENT HISTORY:  Lymphoma- recent chemotherapy and radiation   PAIN:  Are you having pain? No   PRECAUTIONS:  Other: cancer  RED FLAGS: None   WEIGHT BEARING RESTRICTIONS:  No  FALLS:  Has patient fallen in last 6 months? No  LIVING ENVIRONMENT: Lives with: lives with their spouse Lives in: House/apartment Stairs: Yes: Internal: flight steps; on left going up Has following equipment at home: None  OCCUPATION:  Retired   PLOF:  Independent  PATIENT GOALS:  I would like to get strength back and get the muscle tone.   NEXT MD VISIT: November 2025    OBJECTIVE:  Note: Objective measures were completed at Evaluation unless otherwise noted.  DIAGNOSTIC FINDINGS:  PET scan scheduled in November   PATIENT SURVEYS:  Patient-specific activity scoring scheme (Point to one number):  0 represents "unable to perform." 10 represents "able to perform at prior level. 0 1 2 3 4 5 6 7 8 9  10 (Date and Score) Activity Initial  Activity Eval     Lifting > 5 lbs   2    Floor transfer   3    Average 2.5    Additional Additional Total score = sum of the activity scores/number of  activities Minimum detectable change (90%CI) for average score = 2 points Minimum detectable change (90%CI) for single activity score = 3 points PSFS developed by: [name], [name], J. (1995). Assessing disability and change on individual  patients: a report of a patient specific measure. Physiotherapy [place], 47, [phone]. Reproduced with the permission of the authors     COGNITIVE STATUS: Within functional limits for tasks assessed   SENSATION: Reports numbness/tingling in toes     POSTURE:  forward head and increased thoracic kyphosis   GAIT: Distance walked: 20 ft  Assistive device utilized: None Level of assistance: Complete Independence Comments: no obvious gait abnormalities     UPPER EXTREMITY MMT:  MMT Right eval Left eval  Shoulder flexion 4- 4-  Shoulder extension    Shoulder abduction 5 5  Shoulder adduction    Shoulder extension    Shoulder internal rotation    Shoulder external rotation    Middle trapezius    Lower trapezius    Elbow flexion 4 4  Elbow extension 4 4  Wrist flexion    Wrist extension    Wrist ulnar deviation    Wrist radial deviation    Wrist pronation    Wrist supination    Grip strength     (Blank rows = not tested)   LOWER EXTREMITY MMT:    MMT Right eval Left eval  Hip flexion 4- 4-  Hip extension 4 4  Hip abduction 4 4  Hip adduction    Hip internal rotation    Hip external rotation    Knee flexion 4 4  Knee extension 4 4  Ankle dorsiflexion 4- 4-  Ankle plantarflexion    Ankle inversion    Ankle eversion     (Blank rows = not tested)    FUNCTIONAL TESTS:  5 x STS: 10.94 seconds  Squat: limited depth patient reports she feels weak.  [facility]  Adult PT Treatment:                                                DATE: 06/24/24 Therapeutic Exercise: Calf stretch at wall x 30 sec each  Quad stretch x 30 sec each Seated HS stretch x 30 sec each  SL calf raise 2 x 10  HEP update    Neuromuscular re-ed: Lateral banded walks red band at thighs 3 sets d/b x 10 ft  Bent over row 3 x 10; 3 #  Therapeutic Activity: Resisted march red band 3 x 10  Squat 3 x 10 @ 3 lbs    [facility] Adult PT Treatment:                                                DATE: 06/21/24 Therapeutic Exercise: Standing bicep curl +OH lift with 3 lbs bilaterally x10  Sidelying clamshells ER and IR using 1# ankle weights (weight on ankle for IR, weight on distal femur for ER) 2x10 each side  Neuromuscular re-ed: Dynadisc marching in seated 2x10- educated on breathing techniques Bicep Curl bilaterally 2x10 @ 3lbs  Self Care: Discussed breathing techniques and inhalation awareness during exercise to promote increased oxygenated tissues.  Discussed potential to be approved for attending oncology aerobic group Explained mind to muscle connection throughout exercises   [facility] Adult PT Treatment:                                                DATE: 06/16/24 Therapeutic Exercise: Bicep curl 3 x 10 @ 3 lbs  Shoulder flexion with 1 or 2 lbs weights   Neuromuscular re-ed: Hooklying TA brace x 10  Seated TA march 2 x 10  Seated TA march with opposite arm reach 2 x 10  Therapeutic Activity: Bodyweight squats 2 x 10  Forward Step ups on aerobic stepper +2 inserts 2 x 10  Lateral step ups on aerobic stepper +2 inserts 2 x 10     [facility] Adult PT Treatment:                                                DATE: 06/11/24 Therapeutic Exercise: NuStep level 5 x UE/LE  Tricep extension 3 x 10 @ 2 lbs   Therapeutic Activity: Seated hip hinge with dowel x 10  Standing hip hinge with dowel x 10 Standing hip hinge holding dowel in front x 10  Leg press 3 x 10 @ 40 lbs  Curl to overhead press 3 x 10 @ 2 lbs     [facility] Adult PT Treatment:                                                DATE: 06/08/24 Therapeutic Exercise: Standing hip abduction 3 x 10 @ red band  Standing hip  extension 3 x 10 @ red band  LAQ 3 x  10 @ 2 lbs   Therapeutic Activity: Standing march 3 x 10  Sit to stand 1 x 10 @ 5 lbs  Curl to overhead press 3 x 10 @ 1 lb   Self Care: Gym recommendations Potential benefit of purchasing ankle weights for strength training  Proper Lifting education-demo and returned demo   [facility] Adult PT Treatment:                                                DATE: 06/04/24 Therapeutic Exercise: Seated hip abduction 3 x 10; red band  Seated hip flexion 3 x 10; red band  Reviewed and updated HEP   Therapeutic Activity: Seated shoulder flexion 1# 3 x 10  Standing shoulder abduction 1# 3 x 10   Self Care: Nutritional considerations with recommendations on tracking calories, protein; use of fitness app for calculating/tracking or f/u with nutritionist; recommendations on where to purchase protein shakes.       PATIENT EDUCATION:  Education details: HEP update Person educated: Patient Education method: Explanation, demo, cues, handout Education comprehension: verbalized understanding, returned demo, cues   HOME EXERCISE PROGRAM: Access Code: [email or web address]  URL: [email or web address] Date: 06/24/2024 Prepared by: Lucie Meeter  Exercises - Squat  - 1 x daily - 3 x weekly - 2-3 sets - 10 reps - Side Stepping with Resistance at Thighs  - 1 x daily - 3 x weekly - 3 sets - 10 reps - Marching with Resistance  - 1 x daily - 3 x weekly - 3 sets - 10 reps - Single Leg Heel Raise with Counter Support  - 1 x daily - 3 x weekly - 2 sets - 10 reps - Heel Toe Raises with Counter Support  - 1 x daily - 3 x weekly - 2-3 sets - 8-10 reps - Wall Push Up  - 1 x daily - 3 x weekly - 2-3 sets - 8-10 reps - Standing Single Arm Elbow Flexion with Resistance  - 1 x daily - 3 x weekly - 2-3 sets - 8-10 reps - Seated Shoulder Flexion with Dumbbells  - 1 x daily - 3 x weekly - 2-3 sets - 10 reps - Seated Shoulder Abduction with Dumbbells - Thumbs Up  - 1 x daily - 3 x weekly - 2-3 sets - 10 reps -  Sidelying Reverse Clamshell  - 1 x daily - 3 x weekly - 2-3 sets - 10 reps - Clamshell  - 1 x daily - 3 x weekly - 2-3 sets - 10 reps - Standing Quadriceps Stretch  - 1 x daily - 7 x weekly - 3 sets - 30 sec  hold - Seated Hamstring Stretch  - 1 x daily - 7 x weekly - 3 sets - 30 sec  hold - Gastroc Stretch on Wall  - 1 x daily - 7 x weekly - 3 sets - 30 sec  hold ASSESSMENT:  CLINICAL IMPRESSION: Patient requested lower extremities stretches to perform on a daily basis. We completed hamstring, quad, and calf stretching that was added to HEP. Continued strength training progression with light resistance with patient maintaining good form with all exercises without onset of pain or soreness.   EVAL: Patient is a 68 y.o. female who was seen today for physical therapy evaluation and treatment  for muscular deconditioning following radiation and chemotherapy for lymphoma. She has kept up with walking for daily exercise, but reports ongoing weakness and fatigue as a result of lymphoma and resultant treatment. Upon assessment she is noted to have generalized weakness and has difficulty with functional tasks (lifting and transfers). She will benefit from skilled PT to address the above stated deficits in order to optimize her function.     OBJECTIVE IMPAIRMENTS: decreased activity tolerance, decreased balance, decreased endurance, decreased strength, impaired sensation, improper body mechanics, postural dysfunction, and pain.   ACTIVITY LIMITATIONS: carrying, lifting, bending, transfers, and locomotion level  PARTICIPATION LIMITATIONS: meal prep, cleaning, laundry, community activity, and yard work  PERSONAL FACTORS: Age, Fitness, Past/current experiences, Time since onset of injury/illness/exacerbation, and 3+ comorbidities: see PMH above are also affecting patient's functional outcome.   REHAB POTENTIAL: Good  CLINICAL DECISION MAKING: Evolving/moderate complexity  EVALUATION COMPLEXITY:  Moderate   GOALS: Goals reviewed with patient? Yes  SHORT TERM GOALS: Target date: 07/13/2024    Patient will be independent and compliant with initial HEP.   Baseline: initial HEP issued  Goal status: MET  2.  Patient will be able to lift at least 5 lbs with proper form.  Baseline: see above  Goal status: Progressing  3.  Patient will be able to complete lunge without UE support to work towards independent floor transfers.  Baseline: unable  Goal status: INITIAL   LONG TERM GOALS: Target date: 08/29/24  Patient will score at least a 5 on PSFS to signify clinically meaningful improvement in functional abilities.  Baseline: see above Goal status: INITIAL  2.  Patient will demonstrate gross 4+/5 BUE strength to improve ability to lift and carry items.  Baseline: see above Goal status: INITIAL  3.  Patient will demonstrate gross 4+/5 BLE strength to improve stability with transfers.  Baseline: see above Goal status: INITIAL  4.  Patient will be independent with advanced home program, including gym activity to progress/maintain current level of function.  Baseline: initial HEP issued  Goal status: INITIAL   PLAN:  PT FREQUENCY: 1-2x/week  PT DURATION: 12 weeks  PLANNED INTERVENTIONS: 97164- PT Re-evaluation, 97750- Physical Performance Testing, 97110-Therapeutic exercises, 97530- Therapeutic activity, [id number]- Neuromuscular re-education, 97535- Self Care, [id number]- Manual therapy, and Cryotherapy.  PLAN FOR NEXT SESSION: review and update HEP; begin with gentle strengthening, allowing for rest breaks as needed. Functional strengthening as tolerated.    Asaiah Scarber, PT, DPT, ATC 06/24/24 10:16 AM

## 2024-06-29 ENCOUNTER — Ambulatory Visit

## 2024-06-30 NOTE — Therapy (Unsigned)
 OUTPATIENT PHYSICAL THERAPY TREATMENT   Patient Name: Breanna Kirby MRN: [id number] DOB:08-Aug-1956, 68 y.o., female Today's Date: 07/01/2024  END OF SESSION:  PT End of Session - 07/01/24 0931     Visit Number 9    Number of Visits 24    Date for Recertification  08/29/24    Authorization Type BCBS MCR    Progress Note Due on Visit 10    PT Start Time 0931    PT Stop Time 1011    PT Time Calculation (min) 40 min    Activity Tolerance Patient tolerated treatment well    Behavior During Therapy [facility] for tasks assessed/performed                Past Medical History:  Diagnosis Date   Lymphoma [facility])    Past Surgical History:  Procedure Laterality Date   CESAREAN SECTION     Patient Active Problem List   Diagnosis Date Noted   Osteoporosis 11/19/2022   Acute deep vein thrombosis (DVT) of femoral vein (HCC) 12/19/2017   Fatigue 12/03/2017   Essential hypertension 03/14/2016   Follicular lymphoma grade II of lymph nodes of multiple sites (HCC) 04/06/2015   Vitiligo 01/22/2012    PCP: Cleotilde Planas, MD   REFERRING PROVIDER:   [name], [name]    REFERRING DIAG: muscular deconditioning   Rationale for Evaluation and Treatment: Rehabilitation  THERAPY DIAG:  Muscle weakness (generalized)  Other symptoms and signs involving the musculoskeletal system  ONSET DATE: January 2025    SUBJECTIVE:                                                                                                                                                                                           SUBJECTIVE STATEMENT: Pt reports she's feeling good, She went out of town this weekend and didn't really get into exercises very much. She feels she got a good break psychologically. She walked today for about 1 and half miles, with increased pace than normal. She feels she has good energy.   EVAL: Patient reports feeling a mass in the abdomen around January 2025. She was seen by  oncology and began chemo very soon after for large B cell lymphoma. She received chemotherapy for about 6 months. Radiation began in June and just completed this treatment in August. She has a PET scan scheduled in November. She felt like her body did pretty well with chemo. For 2 weeks after radiation she experienced diarrhea that was resolved with medication. She has lost a lot of weight since beginning treatment. She continues to walk for exercise daily. She tried to start  very light weight-lifting, but wasn't really sure where to start. She reports her normal energy level is down a [place] bit. She is cautious with lifting activity. She will experience occasional back pain, but otherwise is doing ok. 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 She feels that she has lost a lot of muscle mass. She reports intermittent numbness/tingling in the toes.   PERTINENT HISTORY:  Lymphoma- recent chemotherapy and radiation   PAIN:  Are you having pain? No   PRECAUTIONS:  Other: cancer  RED FLAGS: None   WEIGHT BEARING RESTRICTIONS:  No  FALLS:  Has patient fallen in last 6 months? No  LIVING ENVIRONMENT: Lives with: lives with their spouse Lives in: House/apartment Stairs: Yes: Internal: flight steps; on left going up Has following equipment at home: None  OCCUPATION:  Retired   PLOF:  Independent  PATIENT GOALS:  I would like to get strength back and get the muscle tone.   NEXT MD VISIT: November 2025    OBJECTIVE:  Note: Objective measures were completed at Evaluation unless otherwise noted.  DIAGNOSTIC FINDINGS:  PET scan scheduled in November   PATIENT SURVEYS:  Patient-specific activity scoring scheme (Point to one number):  0 represents "unable to perform." 10 represents "able to perform at prior level. 0 1 2 3 4 5 6 7 8 9  10 (Date and Score) Activity Initial  Activity Eval     Lifting > 5 lbs   2    Floor transfer    3    Average 2.5    Additional Additional Total score = sum of the activity scores/number of activities Minimum detectable change (90%CI) for average score = 2 points Minimum detectable change (90%CI) for single activity score = 3 points PSFS developed by: [name], [name], J. (1995). Assessing disability and change on individual  patients: a report of a patient specific measure. Physiotherapy [place], 47, [phone]. Reproduced with the permission of the authors     COGNITIVE STATUS: Within functional limits for tasks assessed   SENSATION: Reports numbness/tingling in toes     POSTURE:  forward head and increased thoracic kyphosis   GAIT: Distance walked: 20 ft  Assistive device utilized: None Level of assistance: Complete Independence Comments: no obvious gait abnormalities     UPPER EXTREMITY MMT:  MMT Right eval Left eval  Shoulder flexion 4- 4-  Shoulder extension    Shoulder abduction 5 5  Shoulder adduction    Shoulder extension    Shoulder internal rotation    Shoulder external rotation    Middle trapezius    Lower trapezius    Elbow flexion 4 4  Elbow extension 4 4  Wrist flexion    Wrist extension    Wrist ulnar deviation    Wrist radial deviation    Wrist pronation    Wrist supination    Grip strength     (Blank rows = not tested)   LOWER EXTREMITY MMT:    MMT Right eval Left eval  Hip flexion 4- 4-  Hip extension 4 4  Hip abduction 4 4  Hip adduction    Hip internal rotation    Hip external rotation    Knee flexion 4 4  Knee extension 4 4  Ankle dorsiflexion 4- 4-  Ankle plantarflexion    Ankle inversion    Ankle eversion     (Blank rows = not tested)  FUNCTIONAL TESTS:  5 x STS: 10.94 seconds  Squat: limited depth patient reports she feels weak.   [facility] Adult PT Treatment:                                                DATE: 07/01/24 Therapeutic Exercise: Standing bicep curls + OH press  with 3# 2x10 Seated hamstring stretch 2x30 sec each side   Neuromuscular re-ed: LAQ in seated using 2# ankle weights 3x10 Sit to stands working on quad activation before weight bearing x15  Self Care: Discussed pt POC and discharging soon Reviewed HEP and updated HEP Answered patient's questions regarding HEP and proper body mechanics  [facility] Adult PT Treatment:                                                DATE: 06/24/24 Therapeutic Exercise: Calf stretch at wall x 30 sec each  Quad stretch x 30 sec each Seated HS stretch x 30 sec each  SL calf raise 2 x 10  HEP update   Neuromuscular re-ed: Lateral banded walks red band at thighs 3 sets d/b x 10 ft  Bent over row 3 x 10; 3 #  Therapeutic Activity: Resisted march red band 3 x 10  Squat 3 x 10 @ 3 lbs    [facility] Adult PT Treatment:                                                DATE: 06/21/24 Therapeutic Exercise: Standing bicep curl +OH lift with 3 lbs bilaterally x10  Sidelying clamshells ER and IR using 1# ankle weights (weight on ankle for IR, weight on distal femur for ER) 2x10 each side  Neuromuscular re-ed: Dynadisc marching in seated 2x10- educated on breathing techniques Bicep Curl bilaterally 2x10 @ 3lbs  Self Care: Discussed breathing techniques and inhalation awareness during exercise to promote increased oxygenated tissues.  Discussed potential to be approved for attending oncology aerobic group Explained mind to muscle connection throughout exercises   [facility] Adult PT Treatment:                                                DATE: 06/16/24 Therapeutic Exercise: Bicep curl 3 x 10 @ 3 lbs  Shoulder flexion with 1 or 2 lbs weights   Neuromuscular re-ed: Hooklying TA brace x 10  Seated TA march 2 x 10  Seated TA march with opposite arm reach 2 x 10  Therapeutic Activity: Bodyweight squats 2 x 10  Forward Step ups on aerobic stepper +2 inserts 2 x 10  Lateral step ups on aerobic stepper +2 inserts 2 x 10      [facility] Adult PT Treatment:  DATE: 06/11/24 Therapeutic Exercise: NuStep level 5 x UE/LE  Tricep extension 3 x 10 @ 2 lbs   Therapeutic Activity: Seated hip hinge with dowel x 10  Standing hip hinge with dowel x 10 Standing hip hinge holding dowel in front x 10  Leg press 3 x 10 @ 40 lbs  Curl to overhead press 3 x 10 @ 2 lbs       PATIENT EDUCATION:  Education details: See treatment  Person educated: Patient Education method: Explanation, demo, cues Education comprehension: verbalized understanding, returned demo, cues   HOME EXERCISE PROGRAM: Access Code: [email or web address]  URL: [email or web address] Date: 06/24/2024 Prepared by: Lucie Meeter  Exercises - Squat  - 1 x daily - 3 x weekly - 2-3 sets - 10 reps - Side Stepping with Resistance at Thighs  - 1 x daily - 3 x weekly - 3 sets - 10 reps - Marching with Resistance  - 1 x daily - 3 x weekly - 3 sets - 10 reps - Single Leg Heel Raise with Counter Support  - 1 x daily - 3 x weekly - 2 sets - 10 reps - Heel Toe Raises with Counter Support  - 1 x daily - 3 x weekly - 2-3 sets - 8-10 reps - Wall Push Up  - 1 x daily - 3 x weekly - 2-3 sets - 8-10 reps - Standing Single Arm Elbow Flexion with Resistance  - 1 x daily - 3 x weekly - 2-3 sets - 8-10 reps - Seated Shoulder Flexion with Dumbbells  - 1 x daily - 3 x weekly - 2-3 sets - 10 reps - Seated Shoulder Abduction with Dumbbells - Thumbs Up  - 1 x daily - 3 x weekly - 2-3 sets - 10 reps - Sidelying Reverse Clamshell  - 1 x daily - 3 x weekly - 2-3 sets - 10 reps - Clamshell  - 1 x daily - 3 x weekly - 2-3 sets - 10 reps - Standing Quadriceps Stretch  - 1 x daily - 7 x weekly - 3 sets - 30 sec  hold - Seated Hamstring Stretch  - 1 x daily - 7 x weekly - 3 sets - 30 sec  hold - Gastroc Stretch on Wall  - 1 x daily - 7 x weekly - 3 sets - 30 sec  hold ASSESSMENT:  CLINICAL IMPRESSION:  Pt reports she is feeling and doing much  better since staring PT. We discussed her reducing her frequency sessions to once a week, then reassess. Continued strength training, progressing LAQ to adding 2 lb weights pt tolerated well. Demonstrated bilateral hamstring muscle tightness discovered during LAQ, was redirected to hamstring stretch then returned to LAQ. Pt verbally stated left wrist discomfort at end of bicep curl exercise most likely due to arthritis. Maintained good form with all exercises without onset of pain.   EVAL: Patient is a 68 y.o. female who was seen today for physical therapy evaluation and treatment for muscular deconditioning following radiation and chemotherapy for lymphoma. She has kept up with walking for daily exercise, but reports ongoing weakness and fatigue as a result of lymphoma and resultant treatment. Upon assessment she is noted to have generalized weakness and has difficulty with functional tasks (lifting and transfers). She will benefit from skilled PT to address the above stated deficits in order to optimize her function.     OBJECTIVE IMPAIRMENTS: decreased activity tolerance, decreased balance, decreased endurance, decreased strength, impaired sensation, improper body mechanics, postural dysfunction,  and pain.   ACTIVITY LIMITATIONS: carrying, lifting, bending, transfers, and locomotion level  PARTICIPATION LIMITATIONS: meal prep, cleaning, laundry, community activity, and yard work  PERSONAL FACTORS: Age, Fitness, Past/current experiences, Time since onset of injury/illness/exacerbation, and 3+ comorbidities: see PMH above are also affecting patient's functional outcome.   REHAB POTENTIAL: Good  CLINICAL DECISION MAKING: Evolving/moderate complexity  EVALUATION COMPLEXITY: Moderate   GOALS: Goals reviewed with patient? Yes  SHORT TERM GOALS: Target date: 07/13/2024    Patient will be independent and compliant with initial HEP.   Baseline: initial HEP issued  Goal status: MET  2.   Patient will be able to lift at least 5 lbs with proper form.  Baseline: see above  Goal status: Progressing  3.  Patient will be able to complete lunge without UE support to work towards independent floor transfers.  Baseline: unable  Goal status: INITIAL   LONG TERM GOALS: Target date: 08/29/24  Patient will score at least a 5 on PSFS to signify clinically meaningful improvement in functional abilities.  Baseline: see above Goal status: INITIAL  2.  Patient will demonstrate gross 4+/5 BUE strength to improve ability to lift and carry items.  Baseline: see above Goal status: INITIAL  3.  Patient will demonstrate gross 4+/5 BLE strength to improve stability with transfers.  Baseline: see above Goal status: INITIAL  4.  Patient will be independent with advanced home program, including gym activity to progress/maintain current level of function.  Baseline: initial HEP issued  Goal status: INITIAL   PLAN:  PT FREQUENCY: 1-2x/week  PT DURATION: 12 weeks  PLANNED INTERVENTIONS: 97164- PT Re-evaluation, 97750- Physical Performance Testing, 97110-Therapeutic exercises, 97530- Therapeutic activity, [id number]- Neuromuscular re-education, 97535- Self Care, [id number]- Manual therapy, and Cryotherapy.  PLAN FOR NEXT SESSION: review and update HEP; begin with gentle strengthening, allowing for rest breaks as needed. Functional strengthening as tolerated.    Lavanda Cleverly, SPT 07/01/24 3:46 PM Lavanda Cleverly, SPT 07/01/24 3:46 PM

## 2024-07-01 ENCOUNTER — Ambulatory Visit

## 2024-07-01 DIAGNOSIS — R29898 Other symptoms and signs involving the musculoskeletal system: Secondary | ICD-10-CM

## 2024-07-01 DIAGNOSIS — M6281 Muscle weakness (generalized): Secondary | ICD-10-CM | POA: Diagnosis not present

## 2024-07-01 DIAGNOSIS — R278 Other lack of coordination: Secondary | ICD-10-CM | POA: Diagnosis not present

## 2024-07-01 DIAGNOSIS — R252 Cramp and spasm: Secondary | ICD-10-CM | POA: Diagnosis not present

## 2024-07-01 DIAGNOSIS — M5459 Other low back pain: Secondary | ICD-10-CM | POA: Diagnosis not present

## 2024-07-02 DIAGNOSIS — H5203 Hypermetropia, bilateral: Secondary | ICD-10-CM | POA: Diagnosis not present

## 2024-07-06 ENCOUNTER — Ambulatory Visit

## 2024-07-08 ENCOUNTER — Ambulatory Visit

## 2024-07-08 DIAGNOSIS — M6281 Muscle weakness (generalized): Secondary | ICD-10-CM | POA: Diagnosis not present

## 2024-07-08 DIAGNOSIS — R252 Cramp and spasm: Secondary | ICD-10-CM | POA: Diagnosis not present

## 2024-07-08 DIAGNOSIS — M5459 Other low back pain: Secondary | ICD-10-CM | POA: Diagnosis not present

## 2024-07-08 DIAGNOSIS — R278 Other lack of coordination: Secondary | ICD-10-CM | POA: Diagnosis not present

## 2024-07-08 DIAGNOSIS — R29898 Other symptoms and signs involving the musculoskeletal system: Secondary | ICD-10-CM | POA: Diagnosis not present

## 2024-07-08 NOTE — Therapy (Addendum)
 OUTPATIENT PHYSICAL THERAPY TREATMENT Progress Note Reporting Period 06/01/24 to 07/08/24  See note below for Objective Data and Assessment of Progress/Goals.      Patient Name: Breanna Kirby MRN: [id number] DOB:1956-06-27, 68 y.o., female Today's Date: 07/08/2024  END OF SESSION:  PT End of Session - 07/08/24 1022     Visit Number 10    Number of Visits 24    Date for Recertification  08/29/24    Authorization Type BCBS MCR    Progress Note Due on Visit 20    PT Start Time 1021    PT Stop Time 1100    PT Time Calculation (min) 39 min    Activity Tolerance Patient tolerated treatment well    Behavior During Therapy [facility] for tasks assessed/performed                 Past Medical History:  Diagnosis Date   Lymphoma (HCC)    Past Surgical History:  Procedure Laterality Date   CESAREAN SECTION     Patient Active Problem List   Diagnosis Date Noted   Osteoporosis 11/19/2022   Acute deep vein thrombosis (DVT) of femoral vein (HCC) 12/19/2017   Fatigue 12/03/2017   Essential hypertension 03/14/2016   Follicular lymphoma grade II of lymph nodes of multiple sites (HCC) 04/06/2015   Vitiligo 01/22/2012    PCP: Cleotilde Planas, MD   REFERRING PROVIDER:   [name], [name]    REFERRING DIAG: muscular deconditioning   Rationale for Evaluation and Treatment: Rehabilitation  THERAPY DIAG:  Muscle weakness (generalized)  Other symptoms and signs involving the musculoskeletal system  Other low back pain  Cramp and spasm  ONSET DATE: January 2025    SUBJECTIVE:                                                                                                                                                                                           SUBJECTIVE STATEMENT: Pt reports she would like to be able to workout like she used to, and feels she needs a few more PT sessions so she can get her arms and legs back into shape. Specifically working on learning  back exercises and stretches. States she plans to go to [employer] next week to join a group class.   EVAL: Patient reports feeling a mass in the abdomen around January 2025. She was seen by oncology and began chemo very soon after for large B cell lymphoma. She received chemotherapy for about 6 months. Radiation began in June and just completed this treatment in August. She has a PET scan scheduled in November. She felt like her  body did pretty well with chemo. For 2 weeks after radiation she experienced diarrhea that was resolved with medication. She has lost a lot of weight since beginning treatment. She continues to walk for exercise daily. She tried to start very light weight-lifting, but wasn't really sure where to start. She reports her normal energy level is down a Peyton bit. She is cautious with lifting activity. She will experience occasional back pain, but otherwise is doing ok. 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 She feels that she has lost a lot of muscle mass. She reports intermittent numbness/tingling in the toes.   PERTINENT HISTORY:  Lymphoma- recent chemotherapy and radiation   PAIN:  Are you having pain? No   PRECAUTIONS:  Other: cancer  RED FLAGS: None   WEIGHT BEARING RESTRICTIONS:  No  FALLS:  Has patient fallen in last 6 months? No  LIVING ENVIRONMENT: Lives with: lives with their spouse Lives in: House/apartment Stairs: Yes: Internal: flight steps; on left going up Has following equipment at home: None  OCCUPATION:  Retired   PLOF:  Independent  PATIENT GOALS:  I would like to get strength back and get the muscle tone.   NEXT MD VISIT: November 2025    OBJECTIVE:  Note: Objective measures were completed at Evaluation unless otherwise noted.  DIAGNOSTIC FINDINGS:  PET scan scheduled in November   PATIENT SURVEYS:  Patient-specific activity scoring scheme (Point to one number):  0  represents "unable to perform." 10 represents "able to perform at prior level. 0 1 2 3 4 5 6 7 8 9  10 (Date and Score) Activity Initial  Activity Eval     Lifting > 5 lbs   2    Floor transfer   3    Average 2.5    Additional Additional Total score = sum of the activity scores/number of activities Minimum detectable change (90%CI) for average score = 2 points Minimum detectable change (90%CI) for single activity score = 3 points PSFS developed by: [name], [name], J. (1995). Assessing disability and change on individual  patients: a report of a patient specific measure. Physiotherapy [place], 47, [phone]. Reproduced with the permission of the authors     COGNITIVE STATUS: Within functional limits for tasks assessed   SENSATION: Reports numbness/tingling in toes     POSTURE:  forward head and increased thoracic kyphosis   GAIT: Distance walked: 20 ft  Assistive device utilized: None Level of assistance: Complete Independence Comments: no obvious gait abnormalities     UPPER EXTREMITY MMT:  MMT Right eval Left eval Right  07/08/24 Left 07/08/24   Shoulder flexion 4- 4- 4+ 4+  Shoulder extension      Shoulder abduction 5 5 5 5   Shoulder adduction      Shoulder extension      Shoulder internal rotation   5 5  Shoulder external rotation      Middle trapezius      Lower trapezius      Elbow flexion 4 4 5  4+  Elbow extension 4 4 5  4+  Wrist flexion      Wrist extension      Wrist ulnar deviation      Wrist radial deviation      Wrist pronation      Wrist supination      Grip strength       (Blank rows = not tested)  LOWER EXTREMITY MMT:    MMT Right eval Left eval Right 07/08/24 Left  07/08/24  Hip flexion 4- 4- 4+ 4  Hip extension 4 4 4+ 4  Hip abduction 4 4 4+ 4  Hip adduction      Hip internal rotation      Hip external rotation      Knee flexion 4 4 4+ 4+  Knee extension 4 4 5  4+  Ankle dorsiflexion 4-  4- 5 4+  Ankle plantarflexion      Ankle inversion      Ankle eversion       (Blank rows = not tested)    FUNCTIONAL TESTS:  5 x STS: 10.94 seconds  Squat: limited depth patient reports she feels weak.  [facility] Adult PT Treatment:                                                DATE: 07/08/24 Therapeutic Exercise: Reviewed and update HEP performing as needed.  Therapeutic Activity: Reassessment of goals educating patient on overall progress towards goals  Educated and performed lunges with correct body mechanics, how to lift 5 lbs from floor properly  Floor transfer   [facility] Adult PT Treatment:                                                DATE: 07/01/24 Therapeutic Exercise: Standing bicep curls + OH press with 3# 2x10 Seated hamstring stretch 2x30 sec each side   Neuromuscular re-ed: LAQ in seated using 2# ankle weights 3x10 Sit to stands working on quad activation before weight bearing x15  Self Care: Discussed pt POC and discharging soon Reviewed HEP and updated HEP Answered patient's questions regarding HEP and proper body mechanics  [facility] Adult PT Treatment:                                                DATE: 06/24/24 Therapeutic Exercise: Calf stretch at wall x 30 sec each  Quad stretch x 30 sec each Seated HS stretch x 30 sec each  SL calf raise 2 x 10  HEP update   Neuromuscular re-ed: Lateral banded walks red band at thighs 3 sets d/b x 10 ft  Bent over row 3 x 10; 3 #  Therapeutic Activity: Resisted march red band 3 x 10  Squat 3 x 10 @ 3 lbs    [facility] Adult PT Treatment:                                                DATE: 06/21/24 Therapeutic Exercise: Standing bicep curl +OH lift with 3 lbs bilaterally x10  Sidelying clamshells ER and IR using 1# ankle weights (weight on ankle for IR, weight on distal femur for ER) 2x10 each side  Neuromuscular re-ed: Dynadisc marching in seated 2x10- educated on breathing techniques Bicep Curl bilaterally 2x10 @  3lbs  Self Care: Discussed breathing techniques and inhalation awareness during exercise to  promote increased oxygenated tissues.  Discussed potential to be approved for attending oncology aerobic group Explained mind to muscle connection throughout exercises   [facility] Adult PT Treatment:                                                DATE: 06/16/24 Therapeutic Exercise: Bicep curl 3 x 10 @ 3 lbs  Shoulder flexion with 1 or 2 lbs weights   Neuromuscular re-ed: Hooklying TA brace x 10  Seated TA march 2 x 10  Seated TA march with opposite arm reach 2 x 10  Therapeutic Activity: Bodyweight squats 2 x 10  Forward Step ups on aerobic stepper +2 inserts 2 x 10  Lateral step ups on aerobic stepper +2 inserts 2 x 10     [facility] Adult PT Treatment:                                                DATE: 06/11/24 Therapeutic Exercise: NuStep level 5 x UE/LE  Tricep extension 3 x 10 @ 2 lbs   Therapeutic Activity: Seated hip hinge with dowel x 10  Standing hip hinge with dowel x 10 Standing hip hinge holding dowel in front x 10  Leg press 3 x 10 @ 40 lbs  Curl to overhead press 3 x 10 @ 2 lbs       PATIENT EDUCATION:  Education details: See treatment  Person educated: Patient Education method: Explanation, demo, cues Education comprehension: verbalized understanding, returned demo, cues   HOME EXERCISE PROGRAM: Access Code: [email or web address]  URL: [email or web address] Date: 06/24/2024 Prepared by: Lucie Meeter  Exercises - Squat  - 1 x daily - 3 x weekly - 2-3 sets - 10 reps - Side Stepping with Resistance at Thighs  - 1 x daily - 3 x weekly - 3 sets - 10 reps - Marching with Resistance  - 1 x daily - 3 x weekly - 3 sets - 10 reps - Single Leg Heel Raise with Counter Support  - 1 x daily - 3 x weekly - 2 sets - 10 reps - Heel Toe Raises with Counter Support  - 1 x daily - 3 x weekly - 2-3 sets - 8-10 reps - Wall Push Up  - 1 x daily - 3 x weekly - 2-3 sets - 8-10 reps -  Standing Single Arm Elbow Flexion with Resistance  - 1 x daily - 3 x weekly - 2-3 sets - 8-10 reps - Seated Shoulder Flexion with Dumbbells  - 1 x daily - 3 x weekly - 2-3 sets - 10 reps - Seated Shoulder Abduction with Dumbbells - Thumbs Up  - 1 x daily - 3 x weekly - 2-3 sets - 10 reps - Sidelying Reverse Clamshell  - 1 x daily - 3 x weekly - 2-3 sets - 10 reps - Clamshell  - 1 x daily - 3 x weekly - 2-3 sets - 10 reps - Standing Quadriceps Stretch  - 1 x daily - 7 x weekly - 3 sets - 30 sec  hold - Seated Hamstring Stretch  - 1 x daily - 7 x weekly - 3 sets - 30 sec  hold - Gastroc Stretch on Wall  - 1  x daily - 7 x weekly - 3 sets - 30 sec  hold ASSESSMENT:  CLINICAL IMPRESSION:  Today's  Mrs Hockenbury's 10th PT visit, thus her short term goals were reassessed. Discussed her completing next week's visits then discharging before attending her next CT scan with doctor; is planning to reassess at that time to see if PT remains indicated. Reassessed pt LE and UE strength, demonstrating significant increase in both, meeting goal for UE and partially meeting goal for LE. Pt demonstrated good body mechanics for lifting 5 lb KB from floor, alternating between squatting and dead lifting. Pt performed 10 lunges on both legs without UE, and was able to demonstrate a floor to standing transfer on the mat safely. Patient will benefit from continuing with current POC to address lingering strength deficits and work towards independent advanced home program.   EVAL: Patient is a 68 y.o. female who was seen today for physical therapy evaluation and treatment for muscular deconditioning following radiation and chemotherapy for lymphoma. She has kept up with walking for daily exercise, but reports ongoing weakness and fatigue as a result of lymphoma and resultant treatment. Upon assessment she is noted to have generalized weakness and has difficulty with functional tasks (lifting and transfers). She will benefit from  skilled PT to address the above stated deficits in order to optimize her function.     OBJECTIVE IMPAIRMENTS: decreased activity tolerance, decreased balance, decreased endurance, decreased strength, impaired sensation, improper body mechanics, postural dysfunction, and pain.   ACTIVITY LIMITATIONS: carrying, lifting, bending, transfers, and locomotion level  PARTICIPATION LIMITATIONS: meal prep, cleaning, laundry, community activity, and yard work  PERSONAL FACTORS: Age, Fitness, Past/current experiences, Time since onset of injury/illness/exacerbation, and 3+ comorbidities: see PMH above are also affecting patient's functional outcome.   REHAB POTENTIAL: Good  CLINICAL DECISION MAKING: Evolving/moderate complexity  EVALUATION COMPLEXITY: Moderate   GOALS: Goals reviewed with patient? Yes  SHORT TERM GOALS: Target date: 07/13/2024    Patient will be independent and compliant with initial HEP.   Baseline: initial HEP issued  Goal status: MET  2.  Patient will be able to lift at least 5 lbs with proper form.  Baseline: see above  Goal status: MET on 07/08/24   3.  Patient will be able to complete lunge without UE support to work towards independent floor transfers.  Baseline: unable  Goal status: MET on 07/08/24   LONG TERM GOALS: Target date: 08/29/24  Patient will score at least a 5 on PSFS to signify clinically meaningful improvement in functional abilities.  Baseline: see above Goal status: INITIAL  2.  Patient will demonstrate gross 4+/5 BUE strength to improve ability to lift and carry items.  Baseline: see above Goal status: MET on 07/08/24  3.  Patient will demonstrate gross 4+/5 BLE strength to improve stability with transfers.  Baseline: see above Goal status: Progressing   4.  Patient will be independent with advanced home program, including gym activity to progress/maintain current level of function.  Baseline: initial HEP issued  Goal status: ongoing     PLAN:  PT FREQUENCY: 1-2x/week  PT DURATION: 12 weeks  PLANNED INTERVENTIONS: 97164- PT Re-evaluation, 97750- Physical Performance Testing, 97110-Therapeutic exercises, 97530- Therapeutic activity, [id number]- Neuromuscular re-education, 97535- Self Care, [id number]- Manual therapy, and Cryotherapy.  PLAN FOR NEXT SESSION: review and update HEP; begin with gentle strengthening, allowing for rest breaks as needed. Functional strengthening as tolerated.    Lavanda Cleverly, SPT  Lucie Meeter, PT, DPT, ATC 07/08/24 1:32 PM

## 2024-07-13 ENCOUNTER — Ambulatory Visit

## 2024-07-13 DIAGNOSIS — M6281 Muscle weakness (generalized): Secondary | ICD-10-CM

## 2024-07-13 DIAGNOSIS — R278 Other lack of coordination: Secondary | ICD-10-CM | POA: Diagnosis not present

## 2024-07-13 DIAGNOSIS — R29898 Other symptoms and signs involving the musculoskeletal system: Secondary | ICD-10-CM | POA: Diagnosis not present

## 2024-07-13 DIAGNOSIS — R252 Cramp and spasm: Secondary | ICD-10-CM | POA: Diagnosis not present

## 2024-07-13 DIAGNOSIS — M5459 Other low back pain: Secondary | ICD-10-CM | POA: Diagnosis not present

## 2024-07-13 NOTE — Therapy (Addendum)
 OUTPATIENT PHYSICAL THERAPY TREATMENT      Patient Name: Breanna Kirby MRN: [id number] DOB:Jun 30, 1956, 68 y.o., female Today's Date: 07/13/2024  END OF SESSION:  PT End of Session - 07/13/24 1020     Visit Number 11    Number of Visits 24    Date for Recertification  08/29/24    Authorization Type BCBS MCR    Progress Note Due on Visit 20    PT Start Time 1018    PT Stop Time 1100    PT Time Calculation (min) 42 min    Activity Tolerance Patient tolerated treatment well    Behavior During Therapy [facility] for tasks assessed/performed             Past Medical History:  Diagnosis Date   Lymphoma (HCC)    Past Surgical History:  Procedure Laterality Date   CESAREAN SECTION     Patient Active Problem List   Diagnosis Date Noted   Osteoporosis 11/19/2022   Acute deep vein thrombosis (DVT) of femoral vein (HCC) 12/19/2017   Fatigue 12/03/2017   Essential hypertension 03/14/2016   Follicular lymphoma grade II of lymph nodes of multiple sites (HCC) 04/06/2015   Vitiligo 01/22/2012    PCP: Cleotilde Planas, MD   REFERRING PROVIDER:   [name], [name]    REFERRING DIAG: muscular deconditioning   Rationale for Evaluation and Treatment: Rehabilitation  THERAPY DIAG:  Muscle weakness (generalized)  Other symptoms and signs involving the musculoskeletal system  ONSET DATE: January 2025    SUBJECTIVE:                                                                                                                                                                                           SUBJECTIVE STATEMENT: Pt reports going to the cancer fitness over the weekend. She reports it was super helpful, they took her vitals and gave patient education. She plans to go Friday to a group class.   EVAL: Patient reports feeling a mass in the abdomen around January 2025. She was seen by oncology and began chemo very soon after for large B cell lymphoma. She received  chemotherapy for about 6 months. Radiation began in June and just completed this treatment in August. She has a PET scan scheduled in November. She felt like her body did pretty well with chemo. For 2 weeks after radiation she experienced diarrhea that was resolved with medication. She has lost a lot of weight since beginning treatment. She continues to walk for exercise daily. She tried to start very light weight-lifting, but wasn't really sure where to start. She reports her normal energy  level is down a Riester bit. She is cautious with lifting activity. She will experience occasional back pain, but otherwise is doing ok. 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 She feels that she has lost a lot of muscle mass. She reports intermittent numbness/tingling in the toes.   PERTINENT HISTORY:  Lymphoma- recent chemotherapy and radiation   PAIN:  Are you having pain? No   PRECAUTIONS:  Other: cancer  RED FLAGS: None   WEIGHT BEARING RESTRICTIONS:  No  FALLS:  Has patient fallen in last 6 months? No  LIVING ENVIRONMENT: Lives with: lives with their spouse Lives in: House/apartment Stairs: Yes: Internal: flight steps; on left going up Has following equipment at home: None  OCCUPATION:  Retired   PLOF:  Independent  PATIENT GOALS:  I would like to get strength back and get the muscle tone.   NEXT MD VISIT: November 2025    OBJECTIVE:  Note: Objective measures were completed at Evaluation unless otherwise noted.  DIAGNOSTIC FINDINGS:  PET scan scheduled in November   PATIENT SURVEYS:  Patient-specific activity scoring scheme (Point to one number):  0 represents "unable to perform." 10 represents "able to perform at prior level. 0 1 2 3 4 5 6 7 8 9  10 (Date and Score) Activity Initial  Activity Eval     Lifting > 5 lbs   2    Floor transfer   3    Average 2.5    Additional Additional Total score = sum of the  activity scores/number of activities Minimum detectable change (90%CI) for average score = 2 points Minimum detectable change (90%CI) for single activity score = 3 points PSFS developed by: [name], [name], J. (1995). Assessing disability and change on individual  patients: a report of a patient specific measure. Physiotherapy [place], 47, [phone]. Reproduced with the permission of the authors     COGNITIVE STATUS: Within functional limits for tasks assessed   SENSATION: Reports numbness/tingling in toes     POSTURE:  forward head and increased thoracic kyphosis   GAIT: Distance walked: 20 ft  Assistive device utilized: None Level of assistance: Complete Independence Comments: no obvious gait abnormalities     UPPER EXTREMITY MMT:  MMT Right eval Left eval Right  07/08/24 Left 07/08/24   Shoulder flexion 4- 4- 4+ 4+  Shoulder extension      Shoulder abduction 5 5 5 5   Shoulder adduction      Shoulder extension      Shoulder internal rotation   5 5  Shoulder external rotation      Middle trapezius      Lower trapezius      Elbow flexion 4 4 5  4+  Elbow extension 4 4 5  4+  Wrist flexion      Wrist extension      Wrist ulnar deviation      Wrist radial deviation      Wrist pronation      Wrist supination      Grip strength       (Blank rows = not tested)   LOWER EXTREMITY MMT:    MMT Right eval Left eval Right 07/08/24 Left  07/08/24  Hip flexion 4- 4- 4+ 4  Hip extension 4 4 4+ 4  Hip abduction 4 4 4+ 4  Hip adduction      Hip internal rotation      Hip external  rotation      Knee flexion 4 4 4+ 4+  Knee extension 4 4 5  4+  Ankle dorsiflexion 4- 4- 5 4+  Ankle plantarflexion      Ankle inversion      Ankle eversion       (Blank rows = not tested)    FUNCTIONAL TESTS:  5 x STS: 10.94 seconds  Squat: limited depth patient reports she feels weak.   [facility] Adult PT Treatment:                                                 DATE: 07/13/24 Therapeutic Exercise: Active adductor stretch x10 each side  Butterfly seated stretch 2x20 sec- tightness  Neuromuscular re-ed: Standing against wall dead bug with 2# weights alternating opposite leg to arm 2x10 each side-challenging Posterior pelvic tilt in supine x20  LAQ in seated with 2# ankle weights 2x10  Therapeutic Activity: X10 lunges each leg using mirror therapy  Single limb squat on 6 in step 2x5 no UE support , on 4 in step x10 each leg using UE support- greater knee stability with using UE support    [facility] Adult PT Treatment:                                                DATE: 07/08/24 Therapeutic Exercise: Reviewed and update HEP performing as needed.  Therapeutic Activity: Reassessment of goals educating patient on overall progress towards goals  Educated and performed lunges with correct body mechanics, how to lift 5 lbs from floor properly  Floor transfer   [facility] Adult PT Treatment:                                                DATE: 07/01/24 Therapeutic Exercise: Standing bicep curls + OH press with 3# 2x10 Seated hamstring stretch 2x30 sec each side   Neuromuscular re-ed: LAQ in seated using 2# ankle weights 3x10 Sit to stands working on quad activation before weight bearing x15  Self Care: Discussed pt POC and discharging soon Reviewed HEP and updated HEP Answered patient's questions regarding HEP and proper body mechanics  [facility] Adult PT Treatment:                                                DATE: 06/24/24 Therapeutic Exercise: Calf stretch at wall x 30 sec each  Quad stretch x 30 sec each Seated HS stretch x 30 sec each  SL calf raise 2 x 10  HEP update   Neuromuscular re-ed: Lateral banded walks red band at thighs 3 sets d/b x 10 ft  Bent over row 3 x 10; 3 #  Therapeutic Activity: Resisted march red band 3 x 10  Squat 3 x 10 @ 3 lbs    [facility] Adult PT Treatment:  DATE:  06/21/24 Therapeutic Exercise: Standing bicep curl +OH lift with 3 lbs bilaterally x10  Sidelying clamshells ER and IR using 1# ankle weights (weight on ankle for IR, weight on distal femur for ER) 2x10 each side  Neuromuscular re-ed: Dynadisc marching in seated 2x10- educated on breathing techniques Bicep Curl bilaterally 2x10 @ 3lbs  Self Care: Discussed breathing techniques and inhalation awareness during exercise to promote increased oxygenated tissues.  Discussed potential to be approved for attending oncology aerobic group Explained mind to muscle connection throughout exercises   [facility] Adult PT Treatment:                                                DATE: 06/16/24 Therapeutic Exercise: Bicep curl 3 x 10 @ 3 lbs  Shoulder flexion with 1 or 2 lbs weights   Neuromuscular re-ed: Hooklying TA brace x 10  Seated TA march 2 x 10  Seated TA march with opposite arm reach 2 x 10  Therapeutic Activity: Bodyweight squats 2 x 10  Forward Step ups on aerobic stepper +2 inserts 2 x 10  Lateral step ups on aerobic stepper +2 inserts 2 x 10       PATIENT EDUCATION:  Education details: See treatment  Person educated: Patient Education method: Explanation, demo, cues Education comprehension: verbalized understanding, returned demo, cues   HOME EXERCISE PROGRAM: Access Code: [email or web address]  URL: [email or web address] Date: 06/24/2024 Prepared by: Lucie Meeter  Exercises - Squat  - 1 x daily - 3 x weekly - 2-3 sets - 10 reps - Side Stepping with Resistance at Thighs  - 1 x daily - 3 x weekly - 3 sets - 10 reps - Marching with Resistance  - 1 x daily - 3 x weekly - 3 sets - 10 reps - Single Leg Heel Raise with Counter Support  - 1 x daily - 3 x weekly - 2 sets - 10 reps - Heel Toe Raises with Counter Support  - 1 x daily - 3 x weekly - 2-3 sets - 8-10 reps - Wall Push Up  - 1 x daily - 3 x weekly - 2-3 sets - 8-10 reps - Standing Single Arm Elbow Flexion with Resistance  -  1 x daily - 3 x weekly - 2-3 sets - 8-10 reps - Seated Shoulder Flexion with Dumbbells  - 1 x daily - 3 x weekly - 2-3 sets - 10 reps - Seated Shoulder Abduction with Dumbbells - Thumbs Up  - 1 x daily - 3 x weekly - 2-3 sets - 10 reps - Sidelying Reverse Clamshell  - 1 x daily - 3 x weekly - 2-3 sets - 10 reps - Clamshell  - 1 x daily - 3 x weekly - 2-3 sets - 10 reps - Standing Quadriceps Stretch  - 1 x daily - 7 x weekly - 3 sets - 30 sec  hold - Seated Hamstring Stretch  - 1 x daily - 7 x weekly - 3 sets - 30 sec  hold - Gastroc Stretch on Wall  - 1 x daily - 7 x weekly - 3 sets - 30 sec  hold ASSESSMENT:  CLINICAL IMPRESSION:  Pt most challenged with standing dead bug exercise with weights, likely due to both lack of coordination and core weakness; started against wall however provoked tailbone pain thus added small stability ball between  patient and wall to soften contact. Pt able to complete without adverse reactions, however continued to sway, favoring right side. Upon pt request, trialed series of low back/adductor muscle stretches she tolerated well, and were added to HEP. Continues to demonstrate bilateral knee instability during single limb squat from 4 in step, thus required instructions to use UE support to prevent loss of balance.   EVAL: Patient is a 68 y.o. female who was seen today for physical therapy evaluation and treatment for muscular deconditioning following radiation and chemotherapy for lymphoma. She has kept up with walking for daily exercise, but reports ongoing weakness and fatigue as a result of lymphoma and resultant treatment. Upon assessment she is noted to have generalized weakness and has difficulty with functional tasks (lifting and transfers). She will benefit from skilled PT to address the above stated deficits in order to optimize her function.     OBJECTIVE IMPAIRMENTS: decreased activity tolerance, decreased balance, decreased endurance, decreased strength,  impaired sensation, improper body mechanics, postural dysfunction, and pain.   ACTIVITY LIMITATIONS: carrying, lifting, bending, transfers, and locomotion level  PARTICIPATION LIMITATIONS: meal prep, cleaning, laundry, community activity, and yard work  PERSONAL FACTORS: Age, Fitness, Past/current experiences, Time since onset of injury/illness/exacerbation, and 3+ comorbidities: see PMH above are also affecting patient's functional outcome.   REHAB POTENTIAL: Good  CLINICAL DECISION MAKING: Evolving/moderate complexity  EVALUATION COMPLEXITY: Moderate   GOALS: Goals reviewed with patient? Yes  SHORT TERM GOALS: Target date: 07/13/2024    Patient will be independent and compliant with initial HEP.   Baseline: initial HEP issued  Goal status: MET  2.  Patient will be able to lift at least 5 lbs with proper form.  Baseline: see above  Goal status: MET on 07/08/24   3.  Patient will be able to complete lunge without UE support to work towards independent floor transfers.  Baseline: unable  Goal status: MET on 07/08/24   LONG TERM GOALS: Target date: 08/29/24  Patient will score at least a 5 on PSFS to signify clinically meaningful improvement in functional abilities.  Baseline: see above Goal status: INITIAL  2.  Patient will demonstrate gross 4+/5 BUE strength to improve ability to lift and carry items.  Baseline: see above Goal status: MET on 07/08/24  3.  Patient will demonstrate gross 4+/5 BLE strength to improve stability with transfers.  Baseline: see above Goal status: Progressing   4.  Patient will be independent with advanced home program, including gym activity to progress/maintain current level of function.  Baseline: initial HEP issued  Goal status: ongoing    PLAN:  PT FREQUENCY: 1-2x/week  PT DURATION: 12 weeks  PLANNED INTERVENTIONS: 97164- PT Re-evaluation, 97750- Physical Performance Testing, 97110-Therapeutic exercises, 97530- Therapeutic  activity, [id number]- Neuromuscular re-education, 97535- Self Care, [id number]- Manual therapy, and Cryotherapy.  PLAN FOR NEXT SESSION: review and update HEP;  allowing for rest breaks as needed. Functional strengthening as tolerated. Single limb squats , low back stretches; anticipate D/C   Lavanda Cleverly, SPT 07/13/24 12:05 PM  Lucie Meeter, PT, DPT, ATC 07/13/24 12:06 PM

## 2024-07-14 DIAGNOSIS — I2510 Atherosclerotic heart disease of native coronary artery without angina pectoris: Secondary | ICD-10-CM | POA: Diagnosis not present

## 2024-07-14 DIAGNOSIS — Z1159 Encounter for screening for other viral diseases: Secondary | ICD-10-CM | POA: Diagnosis not present

## 2024-07-14 DIAGNOSIS — M810 Age-related osteoporosis without current pathological fracture: Secondary | ICD-10-CM | POA: Diagnosis not present

## 2024-07-14 DIAGNOSIS — Z0000 Encounter for general adult medical examination without abnormal findings: Secondary | ICD-10-CM | POA: Diagnosis not present

## 2024-07-14 DIAGNOSIS — E7800 Pure hypercholesterolemia, unspecified: Secondary | ICD-10-CM | POA: Diagnosis not present

## 2024-07-14 DIAGNOSIS — F419 Anxiety disorder, unspecified: Secondary | ICD-10-CM | POA: Diagnosis not present

## 2024-07-14 DIAGNOSIS — Z23 Encounter for immunization: Secondary | ICD-10-CM | POA: Diagnosis not present

## 2024-07-15 ENCOUNTER — Other Ambulatory Visit: Payer: Self-pay

## 2024-07-15 ENCOUNTER — Ambulatory Visit

## 2024-07-15 ENCOUNTER — Emergency Department (HOSPITAL_COMMUNITY)
Admission: EM | Admit: 2024-07-15 | Discharge: 2024-07-16 | Disposition: A | Discharge status: Home / Self Care | Attending: Emergency Medicine | Admitting: Emergency Medicine

## 2024-07-15 ENCOUNTER — Encounter (HOSPITAL_COMMUNITY): Payer: Self-pay

## 2024-07-15 ENCOUNTER — Emergency Department (HOSPITAL_COMMUNITY)

## 2024-07-15 DIAGNOSIS — M5459 Other low back pain: Secondary | ICD-10-CM | POA: Diagnosis not present

## 2024-07-15 DIAGNOSIS — D649 Anemia, unspecified: Secondary | ICD-10-CM | POA: Diagnosis not present

## 2024-07-15 DIAGNOSIS — R55 Syncope and collapse: Secondary | ICD-10-CM | POA: Diagnosis not present

## 2024-07-15 DIAGNOSIS — Z8572 Personal history of non-Hodgkin lymphomas: Secondary | ICD-10-CM | POA: Diagnosis not present

## 2024-07-15 DIAGNOSIS — R29898 Other symptoms and signs involving the musculoskeletal system: Secondary | ICD-10-CM | POA: Diagnosis not present

## 2024-07-15 DIAGNOSIS — M6281 Muscle weakness (generalized): Secondary | ICD-10-CM

## 2024-07-15 DIAGNOSIS — R278 Other lack of coordination: Secondary | ICD-10-CM

## 2024-07-15 DIAGNOSIS — R519 Headache, unspecified: Secondary | ICD-10-CM | POA: Diagnosis not present

## 2024-07-15 DIAGNOSIS — R109 Unspecified abdominal pain: Secondary | ICD-10-CM | POA: Diagnosis not present

## 2024-07-15 DIAGNOSIS — R1909 Other intra-abdominal and pelvic swelling, mass and lump: Secondary | ICD-10-CM | POA: Diagnosis not present

## 2024-07-15 DIAGNOSIS — M545 Low back pain, unspecified: Secondary | ICD-10-CM | POA: Insufficient documentation

## 2024-07-15 DIAGNOSIS — R252 Cramp and spasm: Secondary | ICD-10-CM | POA: Diagnosis not present

## 2024-07-15 DIAGNOSIS — R160 Hepatomegaly, not elsewhere classified: Secondary | ICD-10-CM | POA: Diagnosis not present

## 2024-07-15 LAB — COMPREHENSIVE METABOLIC PANEL WITH GFR
ALT: 32 U/L (ref 0–44)
AST: 53 U/L — ABNORMAL HIGH (ref 15–41)
Albumin: 3.9 g/dL (ref 3.5–5.0)
Alkaline Phosphatase: 53 U/L (ref 38–126)
Anion gap: 12 (ref 5–15)
BUN: 15 mg/dL (ref 8–23)
CO2: 25 mmol/L (ref 22–32)
Calcium: 9.3 mg/dL (ref 8.9–10.3)
Chloride: 97 mmol/L — ABNORMAL LOW (ref 98–111)
Creatinine, Ser: 0.71 mg/dL (ref 0.44–1.00)
GFR, Estimated: >60 mL/min (ref >60)
Glucose, Bld: 94 mg/dL (ref 70–99)
Potassium: 3.3 mmol/L — ABNORMAL LOW (ref 3.5–5.1)
Sodium: 134 mmol/L — ABNORMAL LOW (ref 135–145)
Total Bilirubin: 0.5 mg/dL (ref 0.0–1.2)
Total Protein: 5.8 g/dL — ABNORMAL LOW (ref 6.5–8.1)

## 2024-07-15 LAB — CBC WITH DIFFERENTIAL/PLATELET
Abs Immature Granulocytes: 0.01 K/uL (ref 0.00–0.07)
Basophils Absolute: 0 K/uL (ref 0.0–0.1)
Basophils Relative: 0 %
Eosinophils Absolute: 0 K/uL (ref 0.0–0.5)
Eosinophils Relative: 0 %
HCT: 35 % — ABNORMAL LOW (ref 36.0–46.0)
Hemoglobin: 11.5 g/dL — ABNORMAL LOW (ref 12.0–15.0)
Immature Granulocytes: 0 %
Lymphocytes Relative: 5 %
Lymphs Abs: 0.3 K/uL — ABNORMAL LOW (ref 0.7–4.0)
MCH: 31.2 pg (ref 26.0–34.0)
MCHC: 32.9 g/dL (ref 30.0–36.0)
MCV: 94.9 fL (ref 80.0–100.0)
Monocytes Absolute: 0.4 K/uL (ref 0.1–1.0)
Monocytes Relative: 6 %
Neutro Abs: 6.2 K/uL (ref 1.7–7.7)
Neutrophils Relative %: 89 %
Platelets: 135 K/uL — ABNORMAL LOW (ref 150–400)
RBC: 3.69 MIL/uL — ABNORMAL LOW (ref 3.87–5.11)
RDW: 14 % (ref 11.5–15.5)
WBC: 7 K/uL (ref 4.0–10.5)
nRBC: 0 % (ref 0.0–0.2)

## 2024-07-15 LAB — MAGNESIUM: Magnesium: 1.9 mg/dL (ref 1.7–2.4)

## 2024-07-15 MED ORDER — IOHEXOL 350 MG/ML SOLN
75.0000 mL | Freq: Once | INTRAVENOUS | Status: AC | PRN
  Administered 2024-07-15: 75 mL via INTRAVENOUS

## 2024-07-15 NOTE — ED Provider Triage Note (Signed)
 Emergency Medicine Provider Triage Evaluation Note  Breanna Kirby , a 68 y.o. female  was evaluated in triage.  Pt complains of syncopal episode that occurred along with abdominal pain that started today.  History of lymphoma but completed treatment.  Has a repeat PET scan ordered for November 10 to see if she is in remission.  Required radiation for a mass in her abdomen which was completed in August.  Had a syncopal episode and an episode of emesis.  Review of Systems  Positive: As above Negative: As above  Physical Exam  BP (!) 143/88 (BP Location: Right Arm)   Pulse 98   Temp 98.3 F (36.8 C)   Resp 18   Ht 5' 2 (1.575 m)   Wt 46.7 kg   SpO2 96%   BMI 18.84 kg/m  Gen:   Awake, no distress   Resp:  Normal effort  MSK:   Moves extremities without difficulty  Other:    Medical Decision Making  Medically screening exam initiated at 9:39 PM.  Appropriate orders placed.  Sophira A Kun was informed that the remainder of the evaluation will be completed by another provider, this initial triage assessment does not replace that evaluation, and the importance of remaining in the ED until their evaluation is complete.     Hildegard Loge, PA-C 07/15/24 2140

## 2024-07-15 NOTE — Therapy (Addendum)
 OUTPATIENT PHYSICAL THERAPY TREATMENT   PHYSICAL THERAPY DISCHARGE SUMMARY  Visits from Start of Care: 12  Current functional level related to goals / functional outcomes: See goals below   Remaining deficits: Status unknown.   Education / Equipment: N/A   Patient agrees to discharge. Patient goals were partially met. Patient is being discharged due to not returning since the last visit.      Patient Name: Breanna Kirby MRN: [id number] DOB:Feb 13, 1956, 68 y.o., female Today's Date: 07/15/2024  END OF SESSION:  PT End of Session - 07/15/24 1022     Visit Number 12    Number of Visits 24    Date for Recertification  08/29/24    Authorization Type BCBS MCR    Progress Note Due on Visit 20    PT Start Time 1021    PT Stop Time 1100    PT Time Calculation (min) 39 min    Activity Tolerance Patient tolerated treatment well    Behavior During Therapy [facility] for tasks assessed/performed              Past Medical History:  Diagnosis Date   Lymphoma (HCC)    Past Surgical History:  Procedure Laterality Date   CESAREAN SECTION     Patient Active Problem List   Diagnosis Date Noted   Osteoporosis 11/19/2022   Acute deep vein thrombosis (DVT) of femoral vein (HCC) 12/19/2017   Fatigue 12/03/2017   Essential hypertension 03/14/2016   Follicular lymphoma grade II of lymph nodes of multiple sites (HCC) 04/06/2015   Vitiligo 01/22/2012    PCP: Cleotilde Planas, MD   REFERRING PROVIDER:   [name], [name]    REFERRING DIAG: muscular deconditioning   Rationale for Evaluation and Treatment: Rehabilitation  THERAPY DIAG:  Muscle weakness (generalized)  Other symptoms and signs involving the musculoskeletal system  Other low back pain  Cramp and spasm  Other lack of coordination  ONSET DATE: January 2025    SUBJECTIVE:                                                                                                                                                                                            SUBJECTIVE STATEMENT: Pt reports going on eliptical machinefor 12 min, walking 12 min, and treadmill for 12 min at her [facility],  with no max fatigue by end of all cardio. However, felt moderate low back pain that came on gradually that night. Pt states she had to sleep on her side, not her back due to pain.   EVAL: Patient reports feeling a mass in the abdomen around January  2025. She was seen by oncology and began chemo very soon after for large B cell lymphoma. She received chemotherapy for about 6 months. Radiation began in June and just completed this treatment in August. She has a PET scan scheduled in November. She felt like her body did pretty well with chemo. For 2 weeks after radiation she experienced diarrhea that was resolved with medication. She has lost a lot of weight since beginning treatment. She continues to walk for exercise daily. She tried to start very light weight-lifting, but wasn't really sure where to start. She reports her normal energy level is down a Ditmer bit. She is cautious with lifting activity. She will experience occasional back pain, but otherwise is doing ok. 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 She feels that she has lost a lot of muscle mass. She reports intermittent numbness/tingling in the toes.   PERTINENT HISTORY:  Lymphoma- recent chemotherapy and radiation   PAIN:  Are you having pain? No   PRECAUTIONS:  Other: cancer  RED FLAGS: None   WEIGHT BEARING RESTRICTIONS:  No  FALLS:  Has patient fallen in last 6 months? No  LIVING ENVIRONMENT: Lives with: lives with their spouse Lives in: House/apartment Stairs: Yes: Internal: flight steps; on left going up Has following equipment at home: None  OCCUPATION:  Retired   PLOF:  Independent  PATIENT GOALS:  I would like to get strength back and get the muscle tone.    NEXT MD VISIT: November 2025    OBJECTIVE:  Note: Objective measures were completed at Evaluation unless otherwise noted.  DIAGNOSTIC FINDINGS:  PET scan scheduled in November   PATIENT SURVEYS:  Patient-specific activity scoring scheme (Point to one number):  0 represents "unable to perform." 10 represents "able to perform at prior level. 0 1 2 3 4 5 6 7 8 9  10 (Date and Score) Activity Initial  Activity Eval     Lifting > 5 lbs   2    Floor transfer   3    Average 2.5    Additional Additional Total score = sum of the activity scores/number of activities Minimum detectable change (90%CI) for average score = 2 points Minimum detectable change (90%CI) for single activity score = 3 points PSFS developed by: [name], [name], J. (1995). Assessing disability and change on individual  patients: a report of a patient specific measure. [employer] [place], 47, 741-736. Reproduced with the permission of the authors     COGNITIVE STATUS: Within functional limits for tasks assessed   SENSATION: Reports numbness/tingling in toes     POSTURE:  forward head and increased thoracic kyphosis   GAIT: Distance walked: 20 ft  Assistive device utilized: None Level of assistance: Complete Independence Comments: no obvious gait abnormalities     UPPER EXTREMITY MMT:  MMT Right eval Left eval Right  07/08/24 Left 07/08/24   Shoulder flexion 4- 4- 4+ 4+  Shoulder extension      Shoulder abduction 5 5 5 5   Shoulder adduction      Shoulder extension      Shoulder internal rotation   5 5  Shoulder external rotation      Middle trapezius      Lower trapezius      Elbow flexion 4 4 5  4+  Elbow extension 4 4 5  4+  Wrist flexion      Wrist extension  Wrist ulnar deviation      Wrist radial deviation      Wrist pronation      Wrist supination      Grip strength       (Blank rows = not tested)   LOWER EXTREMITY MMT:    MMT  Right eval Left eval Right 07/08/24 Left  07/08/24  Hip flexion 4- 4- 4+ 4  Hip extension 4 4 4+ 4  Hip abduction 4 4 4+ 4  Hip adduction      Hip internal rotation      Hip external rotation      Knee flexion 4 4 4+ 4+  Knee extension 4 4 5  4+  Ankle dorsiflexion 4- 4- 5 4+  Ankle plantarflexion      Ankle inversion      Ankle eversion       (Blank rows = not tested)    FUNCTIONAL TESTS:  5 x STS: 10.94 seconds  Squat: limited depth patient reports she feels weak.  [facility] Adult PT Treatment:                                                DATE: 07/15/24 Therapeutic Exercise: Reviewed and updated HEP performing exercises as needed; discussed frequency, sets, reps, and ways to progress independently.   Manual Therapy: STM to bil QL's due to muscle tightness  Neuromuscular re-ed: Posterior pelvic tilt in seated  Pelvic tilt + marches- for core engagement during dynamic motion  Self Care: Discussed POC  Discussed patient's pain, causes, and treatments   [facility] Adult PT Treatment:                                                DATE: 07/13/24 Therapeutic Exercise: Active adductor stretch x10 each side  Butterfly seated stretch 2x20 sec- tightness  Neuromuscular re-ed: Standing against wall dead bug with 2# weights alternating opposite leg to arm 2x10 each side-challenging Posterior pelvic tilt in supine x20  LAQ in seated with 2# ankle weights 2x10  Therapeutic Activity: X10 lunges each leg using mirror therapy  Single limb squat on 6 in step 2x5 no UE support , on 4 in step x10 each leg using UE support- greater knee stability with using UE support    [facility] Adult PT Treatment:                                                DATE: 07/08/24 Therapeutic Exercise: Reviewed and update HEP performing as needed.  Therapeutic Activity: Reassessment of goals educating patient on overall progress towards goals  Educated and performed lunges with correct body mechanics, how to lift  5 lbs from floor properly  Floor transfer      PATIENT EDUCATION:  Education details: See treatment, POC, updated HEP Person educated: Patient Education method: Explanation, demo, cues, handout  Education comprehension: verbalized understanding, returned demo, cues   HOME EXERCISE PROGRAM: Access Code: [email or web address]  URL: [email or web address] Date: 07/15/2024 Prepared by: Lucie Meeter  Exercises - Half Kneeling Hip Flexor Stretch  - 1 x daily - 7 x weekly -  3 sets - 30 sec  hold - Standing Quadriceps Stretch  - 1 x daily - 7 x weekly - 3 sets - 30 sec  hold - Seated Hamstring Stretch  - 1 x daily - 7 x weekly - 3 sets - 30 sec  hold - Side Lunge Adductor Stretch  - 1 x daily - 7 x weekly - 3 sets - 30 sec  hold - Supine Bilateral Adductor Stretch  - 1 x daily - 7 x weekly - 3 sets - 30 sec  hold - Gastroc Stretch on Wall  - 1 x daily - 7 x weekly - 3 sets - 30 sec  hold - Squat  - 1 x daily - 2 x weekly - 2-3 sets - 10 reps - Side Stepping with Resistance at Thighs  - 1 x daily - 2 x weekly - 3 sets - 10 reps - Marching with Resistance  - 1 x daily - 2 x weekly - 3 sets - 10 reps - Single Leg Heel Raise with Counter Support  - 1 x daily - 2 x weekly - 2 sets - 10 reps - Heel Toe Raises with Counter Support  - 1 x daily - 2 x weekly - 2-3 sets - 8-10 reps - Wall Push Up  - 1 x daily - 2 x weekly - 2-3 sets - 8-10 reps - Seated Shoulder Flexion with Dumbbells  - 1 x daily - 2 x weekly - 2-3 sets - 10 reps - Seated Shoulder Abduction with Dumbbells - Thumbs Up  - 1 x daily - 2 x weekly - 2-3 sets - 10 reps - Sidelying Reverse Clamshell  - 1 x daily - 2 x weekly - 2-3 sets - 10 reps - Clamshell  - 1 x daily - 2 x weekly - 2-3 sets - 10 reps - Bicep Curl to Shoulder Press with Dumbbells  - 1 x daily - 2 x weekly - 2-3 sets - 10 reps - Seated March  - 1 x daily - 2 x weekly - 2 sets - 10 rep ASSESSMENT:  CLINICAL IMPRESSION: Pt progressing well in PT,  today requested  advanced program and hand out. At this time she would like to attempt her advanced HEP on her own, to improve overall tolerance. Pt has plans to f/u with referring provider in November. At this time we will put PT on hold to attempt independent progression of HEP, and if issues arise she will return to PT within next month.    EVAL: Patient is a 68 y.o. female who was seen today for physical therapy evaluation and treatment for muscular deconditioning following radiation and chemotherapy for lymphoma. She has kept up with walking for daily exercise, but reports ongoing weakness and fatigue as a result of lymphoma and resultant treatment. Upon assessment she is noted to have generalized weakness and has difficulty with functional tasks (lifting and transfers). She will benefit from skilled PT to address the above stated deficits in order to optimize her function.     OBJECTIVE IMPAIRMENTS: decreased activity tolerance, decreased balance, decreased endurance, decreased strength, impaired sensation, improper body mechanics, postural dysfunction, and pain.   ACTIVITY LIMITATIONS: carrying, lifting, bending, transfers, and locomotion level  PARTICIPATION LIMITATIONS: meal prep, cleaning, laundry, community activity, and yard work  PERSONAL FACTORS: Age, Fitness, Past/current experiences, Time since onset of injury/illness/exacerbation, and 3+ comorbidities: see PMH above are also affecting patient's functional outcome.   REHAB POTENTIAL: Good  CLINICAL DECISION MAKING:  Evolving/moderate complexity  EVALUATION COMPLEXITY: Moderate   GOALS: Goals reviewed with patient? Yes  SHORT TERM GOALS: Target date: 07/13/2024    Patient will be independent and compliant with initial HEP.   Baseline: initial HEP issued  Goal status: MET  2.  Patient will be able to lift at least 5 lbs with proper form.  Baseline: see above  Goal status: MET on 07/08/24   3.  Patient will be able to complete lunge  without UE support to work towards independent floor transfers.  Baseline: unable  Goal status: MET on 07/08/24   LONG TERM GOALS: Target date: 08/29/24  Patient will score at least a 5 on PSFS to signify clinically meaningful improvement in functional abilities.  Baseline: see above Goal status: INITIAL  2.  Patient will demonstrate gross 4+/5 BUE strength to improve ability to lift and carry items.  Baseline: see above Goal status: MET on 07/08/24  3.  Patient will demonstrate gross 4+/5 BLE strength to improve stability with transfers.  Baseline: see above Goal status: Progressing   4.  Patient will be independent with advanced home program, including gym activity to progress/maintain current level of function.  Baseline: initial HEP issued  Goal status: ongoing    PLAN:  PT FREQUENCY: 1-2x/week  PT DURATION: 12 weeks  PLANNED INTERVENTIONS: 97164- PT Re-evaluation, 97750- Physical Performance Testing, 97110-Therapeutic exercises, 97530- Therapeutic activity, [id number]- Neuromuscular re-education, 97535- Self Care, [id number]- Manual therapy, and Cryotherapy.    Lavanda Cleverly, SPT 07/15/24 12:21 PM  Lucie Meeter, PT, DPT, ATC 07/15/24 12:21 PM  Lucie Meeter, PT, DPT, ATC 08/31/24 11:47 AM

## 2024-07-15 NOTE — ED Triage Notes (Signed)
 Pt reports lower back pain starting yesterday, this morning it started radiating to lower abdomen. This evening patient had syncopal episode around 7:30pm while watching TV. Pain got significantly worse just prior to episode with associated N/V.   Hx of lymphoma in lower abdomen. Treated with radiation, currently in 90 day wait period to re-evalaute for remission. Pt awake and alert in triage, NAD noted. Denies any injury.

## 2024-07-16 LAB — URINALYSIS, ROUTINE W REFLEX MICROSCOPIC
Bilirubin Urine: NEGATIVE
Glucose, UA: NEGATIVE mg/dL
Hgb urine dipstick: NEGATIVE
Ketones, ur: NEGATIVE mg/dL
Leukocytes,Ua: NEGATIVE
Nitrite: NEGATIVE
Protein, ur: NEGATIVE mg/dL
Specific Gravity, Urine: 1.018 (ref 1.005–1.030)
pH: 7 (ref 5.0–8.0)

## 2024-07-16 MED ORDER — HYDROCODONE-ACETAMINOPHEN 5-325 MG PO TABS
1.0000 | ORAL_TABLET | Freq: Once | ORAL | Status: AC
  Administered 2024-07-16: 1 via ORAL
  Filled 2024-07-16: qty 1

## 2024-07-16 MED ORDER — SODIUM CHLORIDE 0.9 % IV BOLUS (SEPSIS)
1000.0000 mL | Freq: Once | INTRAVENOUS | Status: AC
Start: 2024-07-16 — End: 2024-07-16
  Administered 2024-07-16: 1000 mL via INTRAVENOUS

## 2024-07-16 NOTE — ED Provider Notes (Signed)
 [place] [facility] Provider Note   CSN: [id number] Arrival date & time: 07/15/24  2115     Patient presents with: Abdominal Pain, Vomiting, and Loss of Consciousness   Breanna Kirby is a 68 y.o. female.   The history is provided by the patient and the spouse.   Patient with history of lymphoma with previous radiation and chemotherapy presents with back pain and syncope   Patient reports over the past 2-3 days she has been having lower back pain but does not recall any injury.  She has had no leg weakness.  She has been ambulatory.  Earlier tonight she was watching television when she started having nausea vomiting and dizziness.  She reports standing up and feeling lightheaded and had a brief syncopal episode, she was caught by her husband.  No trauma.  No seizures.  No preceding chest pain or shortness of breath.  No new medications. She does not recall having these episodes previously.  She is scheduled for a PET scan soon to evaluate see if she is in remission She denies any known cardiac disease She does report mild intermittent abdominal pain but none at this time Past Medical History:  Diagnosis Date   Lymphoma (HCC)     Prior to Admission medications   Medication Sig Start Date End Date Taking? Authorizing Provider  ALPRAZolam (XANAX) 0.25 MG tablet     [provider]  amLODipine (NORVASC) 2.5 MG tablet Take 1 tablet by mouth daily.    [provider]  B Complex Vitamins (VITAMIN B COMPLEX) TABS     [provider]  Cholecalciferol 125 MCG (5000 UT) TABS Take by mouth.    [provider]  COVID-19 mRNA vaccine, Moderna, 100 MCG/0.5ML injection Inject into the muscle. 01/23/21   Luiz Channel, MD  cyanocobalamin (VITAMIN B12) 1000 MCG tablet 1 tablet Orally Once a day    [provider]  gabapentin  (NEURONTIN ) 100 MG capsule Take 1-3 capsules (100-300 mg total) by mouth 3 (three) times  daily as needed. 06/12/23   Corey, Evan S, MD  influenza vac split quadrivalent PF (FLUARIX) 0.5 ML injection Inject into the muscle. 06/28/21   Luiz Channel, MD  influenza vac split quadrivalent PF (FLUARIX) 0.5 ML injection Inject into the muscle. 07/16/22   Luiz Channel, MD  L-Lysine 500 MG TABS as directed Orally    [provider]  lidocaine  (LIDODERM ) 5 % Place 1 patch onto the skin daily. Remove & Discard patch within 12 hours or as directed by MD 10/20/23   Kingsley, Victoria K, DO  Magnesium Glycinate 100 MG CAPS as directed Orally    [provider]  Menaquinone-7 (VITAMIN K2) 100 MCG CAPS as directed 100-300 mcg Orally daily 12/17/22   [provider]  Multiple Vitamin (MULTIVITAMIN WITH MINERALS) TABS tablet Take 1 tablet by mouth daily.    [provider]  Omega 3-6-9 Fatty Acids (SUPER OMEGA-3) CAPS as directed Orally    [provider]  PARoxetine (PAXIL) 10 MG tablet Take 10 mg by mouth daily.    [provider]  Theanine 200 MG CAPS as directed Orally    [provider]  VITAMIN D PO Take 1 tablet by mouth daily.    [provider]    Allergies: Alendronate and Amoxicillin -pot clavulanate    Review of Systems  Constitutional:  Negative for fever.  Respiratory:  Negative for shortness of breath.   Cardiovascular:  Negative for  chest pain.  Gastrointestinal:  Positive for abdominal pain and vomiting.  Genitourinary:  Negative for dysuria.  Musculoskeletal:  Positive for back pain.  Neurological:  Positive for syncope. Negative for seizures and headaches.    Updated Vital Signs BP 134/86   Pulse 86   Temp 98.3 F (36.8 C)   Resp 16   Ht 1.575 m (5' 2)   Wt 46.7 kg   SpO2 98%   BMI 18.84 kg/m   Physical Exam CONSTITUTIONAL: Elderly, thin appearing but no acute distress, smiling and conversant HEAD: Normocephalic/atraumatic EYES: EOMI/PERRL ENMT: Mucous membranes moist NECK: supple no  meningeal signs SPINE/BACK:entire spine nontender No bruising/crepitance/stepoffs noted to spine CV: S1/S2 noted, no murmurs/rubs/gallops noted LUNGS: Lungs are clear to auscultation bilaterally, no apparent distress ABDOMEN: soft, nontender, no rebound or guarding, bowel sounds noted throughout abdomen GU:no cva tenderness NEURO: Pt is awake/alert/appropriate, moves all extremitiesx4.  No facial droop.  No arm or leg drift. Equal power noted bilateral lower extremities EXTREMITIES: pulses normal/equalx4, full ROM, no deformities or signs of trauma SKIN: warm, color normal PSYCH: no abnormalities of mood noted, alert and oriented to situation  (all labs ordered are listed, but only abnormal results are displayed) Labs Reviewed  CBC WITH DIFFERENTIAL/PLATELET - Abnormal; Notable for the following components:      Result Value   RBC 3.69 (*)    Hemoglobin 11.5 (*)    HCT 35.0 (*)    Platelets 135 (*)    Lymphs Abs 0.3 (*)    All other components within normal limits  COMPREHENSIVE METABOLIC PANEL WITH GFR - Abnormal; Notable for the following components:   Sodium 134 (*)    Potassium 3.3 (*)    Chloride 97 (*)    Total Protein 5.8 (*)    AST 53 (*)    All other components within normal limits  MAGNESIUM  URINALYSIS, ROUTINE W REFLEX MICROSCOPIC    EKG: EKG Interpretation Date/Time:  Wednesday July 15 2024 21:45:01 EDT Ventricular Rate:  91 PR Interval:  128 QRS Duration:  78 QT Interval:  366 QTC Calculation: 450 R Axis:   107  Text Interpretation: Normal sinus rhythm Biatrial enlargement Rightward axis Pulmonary disease pattern Abnormal ECG Interpretation limited secondary to artifact Confirmed by Midge Golas ([id number]) on 07/15/2024 11:25:42 PM  Radiology: CT L-SPINE NO CHARGE Result Date: 07/15/2024 EXAM: CT OF THE LUMBAR SPINE WITHOUT CONTRAST 07/15/2024 11:30:33 PM TECHNIQUE: CT of the lumbar spine was performed without the administration of intravenous  contrast. Multiplanar reformatted images are provided for review. Automated exposure control, iterative reconstruction, and/or weight based adjustment of the mA/kV was utilized to reduce the radiation dose to as low as reasonably achievable. COMPARISON: None available. CLINICAL HISTORY: Abdominal pain, acute, nonlocalized hx lymphoma FINDINGS: BONES AND ALIGNMENT: Normal vertebral body heights. No acute fracture or suspicious bone lesion. Normal alignment. DEGENERATIVE CHANGES: Mild disc space height loss with vacuum phenomenon at L5-S1. Posterior disc bulges at L4-L5 and L5-S1 cause mild effacement of the ventral thecal sac. No severe spinal canal or neural foraminal narrowing. SOFT TISSUES: No acute abnormality. IMPRESSION: 1. No acute fracture or suspicious bone lesion. Electronically signed by: Norman Gatlin MD 07/15/2024 11:59 PM EDT RP Workstation: [id number]   CT ABDOMEN PELVIS W CONTRAST Result Date: 07/15/2024 EXAM: CT ABDOMEN AND PELVIS WITH CONTRAST 07/15/2024 11:30:33 PM TECHNIQUE: CT of the abdomen and pelvis was performed with the administration of 75 mL of iohexol  (OMNIPAQUE ) 350 MG/ML injection. Multiplanar reformatted images are provided for review. Automated  exposure control, iterative reconstruction, and/or weight-based adjustment of the mA/kV was utilized to reduce the radiation dose to as low as reasonably achievable. COMPARISON: Comparison with CT 10/20/2023. CLINICAL HISTORY: Abdominal pain, acute, nonlocalized. History of lymphoma. 75 mL iohexol  (OMNIPAQUE ) 350 MG/ML injection. FINDINGS: LOWER CHEST: No acute abnormality. LIVER: Acute abnormality. The hypodense mass in the liver dome seen previously has nearly resolved with trace subcapsular hypoattenuation on series 14 image 39 . GALLBLADDER AND BILE DUCTS: Gallbladder is unremarkable. No biliary ductal dilatation. SPLEEN: No acute abnormality. PANCREAS: No acute abnormality. ADRENAL GLANDS: No acute abnormality. KIDNEYS, URETERS AND  BLADDER: No stones in the kidneys or ureters. No hydronephrosis. No perinephric or periureteral stranding. Urinary bladder is unremarkable. GI AND BOWEL: Stomach demonstrates no acute abnormality. There is no bowel obstruction. Normal appendix. PERITONEUM AND RETROPERITONEUM: No ascites. No free air. VASCULATURE: Aorta is normal in caliber. Carotid calcification. LYMPH NODES: Heterogeneously enhancing hypoattenuating nodal mass in the left central mesentery has decreased since 10/20/2023 and measures 4.4 x 3.6 cm today, previously 5.8 x 5.3 cm. A 2nd nodal mass along the left common iliac artery measures 4.8 x 3.3 cm, previously 5.7 x 4.8 cm. No new lymphadenopathy. REPRODUCTIVE ORGANS: No acute abnormality. BONES AND SOFT TISSUES: No acute osseous abnormality. No focal soft tissue abnormality. IMPRESSION: 1. No acute abnormality of the abdomen or pelvis. 2. Decreased mesenteric and left iliac nodal masses since 1 / 26 / 25. 3. The previous hypodense mass in the liver dome has now resolved. Electronically signed by: Norman Gatlin MD 07/15/2024 11:58 PM EDT RP Workstation: [id number]   CT Head Wo Contrast Result Date: 07/15/2024 EXAM: CT HEAD WITHOUT CONTRAST 07/15/2024 11:30:33 PM TECHNIQUE: CT of the head was performed without the administration of intravenous contrast. Automated exposure control, iterative reconstruction, and/or weight based adjustment of the mA/kV was utilized to reduce the radiation dose to as low as reasonably achievable. COMPARISON: 04/15/2008 CLINICAL HISTORY: Headache, new onset (Age >= 51y). Headache, loss of consciousnesshx lymphoma. FINDINGS: BRAIN AND VENTRICLES: No acute hemorrhage. No evidence of acute infarct. Patchy white matter hypoattenuation most commonly due to chronic small vessel disease. No hydrocephalus. No extra-axial collection. No mass effect or midline shift. ORBITS: No acute abnormality. SINUSES: No acute abnormality. SOFT TISSUES AND SKULL: No acute soft tissue  abnormality. No skull fracture. IMPRESSION: 1. No acute intracranial abnormality. 2. Patchy white matter hypoattenuation consistent with chronic small vessel disease. Electronically signed by: Franky Stanford MD 07/15/2024 11:39 PM EDT RP Workstation: [id number]     Procedures   Medications Ordered in the ED  iohexol  (OMNIPAQUE ) 350 MG/ML injection 75 mL (75 mLs Intravenous Contrast Given 07/15/24 2331)  sodium chloride 0.9 % bolus 1,000 mL (0 mLs Intravenous Stopped 07/16/24 0116)  HYDROcodone -acetaminophen  (NORCO/VICODIN) 5-325 MG per tablet 1 tablet (1 tablet Oral Given 07/16/24 0110)    Clinical Course as of 07/16/24 0129  Thu Jul 16, 2024  0023 Patient presents after syncopal episode and having back pain.  She is overall well-appearing at this time. Initial workup was overall unrevealing We will continue to monitor [DW]  0108 Patient is very well-appearing. CT imaging is unremarkable. She has no focal leg weakness, denies any incontinence.  Patient now suspects that her pain is worsened which triggered the vomiting and lightheadedness.  She never had any chest pain or shortness of breath. It appears that her masses are improving though she still needs to have her PET scan Patient prefers to be discharged home.  Nursing staff is documented patient  is been walking around in no distress with steady gait she has had no tachycardia or hypoxia to suggest acute PE [DW]  0110 Low suspicion for acute cardiopulmonary emergency  Patient reports she does have pain medicine at home which has been provided previously [DW]  0128 Patient is requesting discharge, she is sitting up in the chair fully dressed and appears appropriate for discharge home [DW]    Clinical Course User Index [DW] Midge Golas, MD                                 Medical Decision Making Amount and/or Complexity of Data Reviewed Labs: ordered.  Risk Prescription drug management.   This patient presents to the  ED for concern of syncope, this involves an extensive number of treatment options, and is a complaint that carries with it a high risk of complications and morbidity.  The differential diagnosis includes but is not limited to static hypotension, vasovagal syncope, cardiac arrhythmia, dehydration, electrolyte abnormality, AAA, PE  Comorbidities that complicate the patient evaluation: Patient's presentation is complicated by their history of lymphoma  Additional history obtained: Additional history obtained from spouse Records reviewed Care Everywhere/External Records  Lab Tests: I Ordered, and personally interpreted labs.  The pertinent results include: Mild anemia  Imaging Studies ordered: I ordered imaging studies including CT scan head, CT abdomen pelvis and lumbar spine  I independently visualized and interpreted imaging which showed no acute intracranial injury, improved masses I agree with the radiologist interpretation  Cardiac Monitoring: The patient was maintained on a cardiac monitor.  I personally viewed and interpreted the cardiac monitor which showed an underlying rhythm of:  sinus rhythm  Medicines ordered and prescription drug management: I ordered medication including IV fluids for syncope Reevaluation of the patient after these medicines showed that the patient    improved   Reevaluation: After the interventions noted above, I reevaluated the patient and found that they have :improved  Complexity of problems addressed: Patient's presentation is most consistent with  acute presentation with potential threat to life or bodily function  Disposition: After consideration of the diagnostic results and the patient's response to treatment,  I feel that the patent would benefit from discharge  .        Final diagnoses:  Syncope and collapse  Acute midline low back pain without sciatica    ED Discharge Orders     None          Midge Golas, MD 07/16/24  [phone]

## 2024-07-16 NOTE — Discharge Instructions (Signed)
 SEEK IMMEDIATE MEDICAL ATTENTION IF: New numbness, tingling, weakness, or problem with the use of your arms or legs.  Severe back pain not relieved with medications.  Change in bowel or bladder control (if you lose control of stool or urine, or if you are unable to urinate) Increasing pain in any areas of the body (such as chest or abdominal pain).  Shortness of breath, dizziness or fainting.  Nausea (feeling sick to your stomach), vomiting, fever, or sweats.

## 2024-07-21 DIAGNOSIS — R110 Nausea: Secondary | ICD-10-CM | POA: Diagnosis not present

## 2024-07-21 DIAGNOSIS — C8333 Diffuse large B-cell lymphoma, intra-abdominal lymph nodes: Secondary | ICD-10-CM | POA: Diagnosis not present

## 2024-07-21 DIAGNOSIS — R6881 Early satiety: Secondary | ICD-10-CM | POA: Diagnosis not present

## 2024-07-21 DIAGNOSIS — C8218 Follicular lymphoma grade II, lymph nodes of multiple sites: Secondary | ICD-10-CM | POA: Diagnosis not present

## 2024-07-21 DIAGNOSIS — D696 Thrombocytopenia, unspecified: Secondary | ICD-10-CM | POA: Diagnosis not present

## 2024-07-21 DIAGNOSIS — Z86718 Personal history of other venous thrombosis and embolism: Secondary | ICD-10-CM | POA: Diagnosis not present

## 2024-07-21 DIAGNOSIS — F419 Anxiety disorder, unspecified: Secondary | ICD-10-CM | POA: Diagnosis not present

## 2024-07-21 DIAGNOSIS — C8203 Follicular lymphoma grade I, intra-abdominal lymph nodes: Secondary | ICD-10-CM | POA: Diagnosis not present

## 2024-07-21 DIAGNOSIS — M810 Age-related osteoporosis without current pathological fracture: Secondary | ICD-10-CM | POA: Diagnosis not present

## 2024-07-21 DIAGNOSIS — E8809 Other disorders of plasma-protein metabolism, not elsewhere classified: Secondary | ICD-10-CM | POA: Diagnosis not present

## 2024-07-21 DIAGNOSIS — I2510 Atherosclerotic heart disease of native coronary artery without angina pectoris: Secondary | ICD-10-CM | POA: Diagnosis not present

## 2024-07-21 DIAGNOSIS — Z79899 Other long term (current) drug therapy: Secondary | ICD-10-CM | POA: Diagnosis not present

## 2024-07-21 DIAGNOSIS — G629 Polyneuropathy, unspecified: Secondary | ICD-10-CM | POA: Diagnosis not present

## 2024-07-21 DIAGNOSIS — C8290 Follicular lymphoma, unspecified, unspecified site: Secondary | ICD-10-CM | POA: Diagnosis not present

## 2024-07-21 DIAGNOSIS — R197 Diarrhea, unspecified: Secondary | ICD-10-CM | POA: Diagnosis not present

## 2024-07-21 DIAGNOSIS — G893 Neoplasm related pain (acute) (chronic): Secondary | ICD-10-CM | POA: Diagnosis not present

## 2024-07-21 DIAGNOSIS — I10 Essential (primary) hypertension: Secondary | ICD-10-CM | POA: Diagnosis not present

## 2024-07-21 DIAGNOSIS — C8590 Non-Hodgkin lymphoma, unspecified, unspecified site: Secondary | ICD-10-CM | POA: Diagnosis not present

## 2024-07-21 DIAGNOSIS — E782 Mixed hyperlipidemia: Secondary | ICD-10-CM | POA: Diagnosis not present

## 2024-07-21 DIAGNOSIS — R69 Illness, unspecified: Secondary | ICD-10-CM | POA: Diagnosis not present

## 2024-07-21 DIAGNOSIS — Z881 Allergy status to other antibiotic agents status: Secondary | ICD-10-CM | POA: Diagnosis not present

## 2024-07-21 DIAGNOSIS — K5900 Constipation, unspecified: Secondary | ICD-10-CM | POA: Diagnosis not present

## 2024-07-27 DIAGNOSIS — C8218 Follicular lymphoma grade II, lymph nodes of multiple sites: Secondary | ICD-10-CM | POA: Diagnosis not present

## 2024-07-27 DIAGNOSIS — R300 Dysuria: Secondary | ICD-10-CM | POA: Diagnosis not present

## 2024-07-31 DIAGNOSIS — C8218 Follicular lymphoma grade II, lymph nodes of multiple sites: Secondary | ICD-10-CM | POA: Diagnosis not present

## 2024-08-03 DIAGNOSIS — C8218 Follicular lymphoma grade II, lymph nodes of multiple sites: Secondary | ICD-10-CM | POA: Diagnosis not present

## 2024-08-05 DIAGNOSIS — C8218 Follicular lymphoma grade II, lymph nodes of multiple sites: Secondary | ICD-10-CM | POA: Diagnosis not present

## 2024-08-10 DIAGNOSIS — C8218 Follicular lymphoma grade II, lymph nodes of multiple sites: Secondary | ICD-10-CM | POA: Diagnosis not present

## 2024-08-14 DIAGNOSIS — C8218 Follicular lymphoma grade II, lymph nodes of multiple sites: Secondary | ICD-10-CM | POA: Diagnosis not present

## 2024-08-14 DIAGNOSIS — C8330 Diffuse large B-cell lymphoma, unspecified site: Secondary | ICD-10-CM | POA: Diagnosis not present

## 2024-08-14 DIAGNOSIS — D696 Thrombocytopenia, unspecified: Secondary | ICD-10-CM | POA: Diagnosis not present

## 2024-08-14 DIAGNOSIS — R110 Nausea: Secondary | ICD-10-CM | POA: Diagnosis not present

## 2024-08-14 DIAGNOSIS — D649 Anemia, unspecified: Secondary | ICD-10-CM | POA: Diagnosis not present

## 2024-08-21 DIAGNOSIS — C8330 Diffuse large B-cell lymphoma, unspecified site: Secondary | ICD-10-CM | POA: Diagnosis not present

## 2024-08-21 DIAGNOSIS — C8210 Follicular lymphoma grade II, unspecified site: Secondary | ICD-10-CM | POA: Diagnosis not present

## 2024-08-21 DIAGNOSIS — Z5111 Encounter for antineoplastic chemotherapy: Secondary | ICD-10-CM | POA: Diagnosis not present

## 2024-08-23 DIAGNOSIS — Z5111 Encounter for antineoplastic chemotherapy: Secondary | ICD-10-CM | POA: Diagnosis not present

## 2024-08-23 DIAGNOSIS — C8210 Follicular lymphoma grade II, unspecified site: Secondary | ICD-10-CM | POA: Diagnosis not present

## 2024-08-23 DIAGNOSIS — C8330 Diffuse large B-cell lymphoma, unspecified site: Secondary | ICD-10-CM | POA: Diagnosis not present

## 2024-08-24 ENCOUNTER — Ambulatory Visit

## 2024-08-25 DIAGNOSIS — C8218 Follicular lymphoma grade II, lymph nodes of multiple sites: Secondary | ICD-10-CM | POA: Diagnosis not present

## 2024-08-26 DIAGNOSIS — C8218 Follicular lymphoma grade II, lymph nodes of multiple sites: Secondary | ICD-10-CM | POA: Diagnosis not present

## 2024-08-28 DIAGNOSIS — C8218 Follicular lymphoma grade II, lymph nodes of multiple sites: Secondary | ICD-10-CM | POA: Diagnosis not present

## 2024-09-04 DIAGNOSIS — C8330 Diffuse large B-cell lymphoma, unspecified site: Secondary | ICD-10-CM | POA: Diagnosis not present

## 2024-09-04 DIAGNOSIS — Z113 Encounter for screening for infections with a predominantly sexual mode of transmission: Secondary | ICD-10-CM | POA: Diagnosis not present

## 2024-09-04 DIAGNOSIS — Z79899 Other long term (current) drug therapy: Secondary | ICD-10-CM | POA: Diagnosis not present

## 2024-09-04 DIAGNOSIS — Z5181 Encounter for therapeutic drug level monitoring: Secondary | ICD-10-CM | POA: Diagnosis not present

## 2024-09-04 DIAGNOSIS — D809 Immunodeficiency with predominantly antibody defects, unspecified: Secondary | ICD-10-CM | POA: Diagnosis not present

## 2024-09-04 DIAGNOSIS — Z1159 Encounter for screening for other viral diseases: Secondary | ICD-10-CM | POA: Diagnosis not present

## 2024-09-07 DIAGNOSIS — C8218 Follicular lymphoma grade II, lymph nodes of multiple sites: Secondary | ICD-10-CM | POA: Diagnosis not present

## 2024-09-07 DIAGNOSIS — C8330 Diffuse large B-cell lymphoma, unspecified site: Secondary | ICD-10-CM | POA: Diagnosis not present

## 2024-10-28 ENCOUNTER — Inpatient Hospital Stay: Admission: RE | Admit: 2024-10-28 | Source: Ambulatory Visit
